# Patient Record
Sex: Male | Born: 1935 | Race: White | Hispanic: No | Marital: Married | Smoking: Never smoker
Health system: Southern US, Community
[De-identification: ages and names within clinical notes are randomized; demographics above are authoritative.]

---

## 2020-03-24 ENCOUNTER — Emergency Department: Payer: Medicare PPO

## 2020-03-24 ENCOUNTER — Inpatient Hospital Stay
Admission: EM | Admit: 2020-03-24 | Discharge: 2020-04-04 | DRG: 177 | Disposition: A | Discharge status: Home Health | Payer: Medicare PPO | Attending: Internal Medicine | Admitting: Internal Medicine

## 2020-03-24 ENCOUNTER — Other Ambulatory Visit: Payer: Self-pay

## 2020-03-24 DIAGNOSIS — G9341 Metabolic encephalopathy: Secondary | ICD-10-CM | POA: Diagnosis present

## 2020-03-24 DIAGNOSIS — R030 Elevated blood-pressure reading, without diagnosis of hypertension: Secondary | ICD-10-CM | POA: Diagnosis present

## 2020-03-24 DIAGNOSIS — I4891 Unspecified atrial fibrillation: Secondary | ICD-10-CM | POA: Diagnosis present

## 2020-03-24 DIAGNOSIS — F05 Delirium due to known physiological condition: Secondary | ICD-10-CM | POA: Diagnosis not present

## 2020-03-24 DIAGNOSIS — R4182 Altered mental status, unspecified: Secondary | ICD-10-CM

## 2020-03-24 DIAGNOSIS — U071 COVID-19: Secondary | ICD-10-CM | POA: Diagnosis present

## 2020-03-24 DIAGNOSIS — I248 Other forms of acute ischemic heart disease: Secondary | ICD-10-CM | POA: Diagnosis present

## 2020-03-24 DIAGNOSIS — J1282 Pneumonia due to coronavirus disease 2019: Secondary | ICD-10-CM

## 2020-03-24 DIAGNOSIS — Z23 Encounter for immunization: Secondary | ICD-10-CM | POA: Diagnosis present

## 2020-03-24 DIAGNOSIS — Z6823 Body mass index (BMI) 23.0-23.9, adult: Secondary | ICD-10-CM | POA: Diagnosis not present

## 2020-03-24 DIAGNOSIS — K5900 Constipation, unspecified: Secondary | ICD-10-CM

## 2020-03-24 DIAGNOSIS — F09 Unspecified mental disorder due to known physiological condition: Secondary | ICD-10-CM

## 2020-03-24 DIAGNOSIS — I2699 Other pulmonary embolism without acute cor pulmonale: Secondary | ICD-10-CM | POA: Diagnosis present

## 2020-03-24 DIAGNOSIS — N179 Acute kidney failure, unspecified: Secondary | ICD-10-CM

## 2020-03-24 DIAGNOSIS — E639 Nutritional deficiency, unspecified: Secondary | ICD-10-CM

## 2020-03-24 DIAGNOSIS — E43 Unspecified severe protein-calorie malnutrition: Secondary | ICD-10-CM | POA: Diagnosis present

## 2020-03-24 DIAGNOSIS — Z515 Encounter for palliative care: Secondary | ICD-10-CM | POA: Diagnosis not present

## 2020-03-24 DIAGNOSIS — R634 Abnormal weight loss: Secondary | ICD-10-CM

## 2020-03-24 DIAGNOSIS — Z7189 Other specified counseling: Secondary | ICD-10-CM | POA: Diagnosis not present

## 2020-03-24 DIAGNOSIS — R778 Other specified abnormalities of plasma proteins: Secondary | ICD-10-CM

## 2020-03-24 DIAGNOSIS — E860 Dehydration: Secondary | ICD-10-CM | POA: Diagnosis present

## 2020-03-24 LAB — COMPREHENSIVE METABOLIC PANEL
ALT: 27 U/L (ref 0–44)
AST: 46 U/L — ABNORMAL HIGH (ref 15–41)
Albumin: 3.6 g/dL (ref 3.5–5.0)
Alkaline Phosphatase: 69 U/L (ref 38–126)
Anion gap: 19 — ABNORMAL HIGH (ref 5–15)
BUN: 62 mg/dL — ABNORMAL HIGH (ref 8–23)
CO2: 23 mmol/L (ref 22–32)
Calcium: 9.4 mg/dL (ref 8.9–10.3)
Chloride: 99 mmol/L (ref 98–111)
Creatinine, Ser: 1.51 mg/dL — ABNORMAL HIGH (ref 0.61–1.24)
GFR, Estimated: 44 mL/min — ABNORMAL LOW (ref >60)
Glucose, Bld: 136 mg/dL — ABNORMAL HIGH (ref 70–99)
Potassium: 4.3 mmol/L (ref 3.5–5.1)
Sodium: 141 mmol/L (ref 135–145)
Total Bilirubin: 2 mg/dL — ABNORMAL HIGH (ref 0.3–1.2)
Total Protein: 7.8 g/dL (ref 6.5–8.1)

## 2020-03-24 LAB — CBC
HCT: 51.3 % (ref 39.0–52.0)
Hemoglobin: 17.1 g/dL — ABNORMAL HIGH (ref 13.0–17.0)
MCH: 29.7 pg (ref 26.0–34.0)
MCHC: 33.3 g/dL (ref 30.0–36.0)
MCV: 89.2 fL (ref 80.0–100.0)
Platelets: 261 10*3/uL (ref 150–400)
RBC: 5.75 MIL/uL (ref 4.22–5.81)
RDW: 13.4 % (ref 11.5–15.5)
WBC: 12.4 10*3/uL — ABNORMAL HIGH (ref 4.0–10.5)
nRBC: 0 % (ref 0.0–0.2)

## 2020-03-24 LAB — ACETAMINOPHEN LEVEL: Acetaminophen (Tylenol), Serum: <10 ug/mL — ABNORMAL LOW (ref 10–30)

## 2020-03-24 LAB — SALICYLATE LEVEL: Salicylate Lvl: <7 mg/dL — ABNORMAL LOW (ref 7.0–30.0)

## 2020-03-24 LAB — ETHANOL: Alcohol, Ethyl (B): <10 mg/dL (ref <10)

## 2020-03-24 LAB — RESP PANEL BY RT-PCR (FLU A&B, COVID) ARPGX2
Influenza A by PCR: NEGATIVE
Influenza B by PCR: NEGATIVE
SARS Coronavirus 2 by RT PCR: POSITIVE — AB

## 2020-03-24 LAB — TROPONIN I (HIGH SENSITIVITY)
Troponin I (High Sensitivity): 40 ng/L — ABNORMAL HIGH (ref <18)
Troponin I (High Sensitivity): 60 ng/L — ABNORMAL HIGH (ref <18)

## 2020-03-24 LAB — TSH: TSH: 1.62 u[IU]/mL (ref 0.350–4.500)

## 2020-03-24 MED ORDER — HYDROCOD POLST-CPM POLST ER 10-8 MG/5ML PO SUER
5.0000 mL | Freq: Two times a day (BID) | ORAL | Status: DC | PRN
  Administered 2020-03-26 – 2020-04-02 (×5): 5 mL via ORAL
  Filled 2020-03-24 (×7): qty 5

## 2020-03-24 MED ORDER — ONDANSETRON HCL 4 MG/2ML IJ SOLN: 4.0000 mg | Freq: Four times a day (QID) | INTRAMUSCULAR | Status: DC | PRN

## 2020-03-24 MED ORDER — ZINC SULFATE 220 (50 ZN) MG PO CAPS
220.0000 mg | ORAL_CAPSULE | Freq: Every day | ORAL | Status: DC
  Administered 2020-03-25 – 2020-04-04 (×11): 220 mg via ORAL
  Filled 2020-03-24 (×11): qty 1

## 2020-03-24 MED ORDER — SODIUM CHLORIDE 0.9 % IV SOLN: INTRAVENOUS | Status: AC

## 2020-03-24 MED ORDER — ACETAMINOPHEN 325 MG PO TABS: 650.0000 mg | ORAL_TABLET | Freq: Four times a day (QID) | ORAL | Status: DC | PRN

## 2020-03-24 MED ORDER — ALBUTEROL SULFATE HFA 108 (90 BASE) MCG/ACT IN AERS
2.0000 | INHALATION_SPRAY | Freq: Four times a day (QID) | RESPIRATORY_TRACT | Status: DC
  Administered 2020-03-25 – 2020-04-03 (×29): 2 via RESPIRATORY_TRACT
  Filled 2020-03-24: qty 6.7

## 2020-03-24 MED ORDER — SODIUM CHLORIDE 0.9 % IV SOLN
100.0000 mg | Freq: Every day | INTRAVENOUS | Status: AC
  Administered 2020-03-25 – 2020-03-28 (×4): 100 mg via INTRAVENOUS
  Filled 2020-03-24 (×5): qty 20

## 2020-03-24 MED ORDER — ASCORBIC ACID 500 MG PO TABS
500.0000 mg | ORAL_TABLET | Freq: Every day | ORAL | Status: DC
  Administered 2020-03-25 – 2020-04-04 (×11): 500 mg via ORAL
  Filled 2020-03-24 (×11): qty 1

## 2020-03-24 MED ORDER — ZOLPIDEM TARTRATE 5 MG PO TABS: 5.0000 mg | ORAL_TABLET | Freq: Every evening | ORAL | Status: DC | PRN

## 2020-03-24 MED ORDER — METOPROLOL TARTRATE 25 MG PO TABS
25.0000 mg | ORAL_TABLET | Freq: Two times a day (BID) | ORAL | Status: DC
  Administered 2020-03-24 – 2020-03-30 (×10): 25 mg via ORAL
  Filled 2020-03-24 (×12): qty 1

## 2020-03-24 MED ORDER — ONDANSETRON HCL 4 MG PO TABS: 4.0000 mg | ORAL_TABLET | Freq: Four times a day (QID) | ORAL | Status: DC | PRN

## 2020-03-24 MED ORDER — SODIUM CHLORIDE 0.9 % IV BOLUS
1000.0000 mL | Freq: Once | INTRAVENOUS | Status: AC
  Administered 2020-03-24: 1000 mL via INTRAVENOUS

## 2020-03-24 MED ORDER — ENOXAPARIN SODIUM 40 MG/0.4ML ~~LOC~~ SOLN
40.0000 mg | Freq: Every day | SUBCUTANEOUS | Status: DC
  Administered 2020-03-24: 40 mg via SUBCUTANEOUS
  Filled 2020-03-24: qty 0.4

## 2020-03-24 MED ORDER — GUAIFENESIN-DM 100-10 MG/5ML PO SYRP: 10.0000 mL | ORAL_SOLUTION | ORAL | Status: DC | PRN

## 2020-03-24 MED ORDER — HYDROCODONE-ACETAMINOPHEN 5-325 MG PO TABS: 1.0000 | ORAL_TABLET | ORAL | Status: DC | PRN

## 2020-03-24 MED ORDER — SODIUM CHLORIDE 0.9 % IV SOLN
200.0000 mg | Freq: Once | INTRAVENOUS | Status: AC
  Administered 2020-03-25: 200 mg via INTRAVENOUS
  Filled 2020-03-24: qty 200

## 2020-03-24 NOTE — ED Triage Notes (Signed)
Pt comes via [employer] with IVC paperwork. Per paperwork it states that the pt isn't eating or drinking. Family feels he may have dementia. Family really wants him medically cleared and checked out.  Pt denies any SI,HI, drug or alcohol abuse. Pt is calm and cooperative. Pt denies any hallucinations.  Pt states he doesn't eat because he doesn't have any money.

## 2020-03-24 NOTE — ED Provider Notes (Signed)
[facility] Emergency Department Provider Note ____________________________________________   First MD Initiated Contact with Patient 03/24/20 1806     (approximate)  I have reviewed the triage vital signs and the nursing notes.   HISTORY  Chief Complaint IVC  Level 5 caveat: History present illness limited due to altered mental status and possible dementia  HPI Darryl Haley is a 86 y.o. male with no known medical problems who presents with increased confusion and concern for possible dementia.  He was placed under involuntary commitment by family.  Per the IVC paperwork, "My father will not eat.  He won't take a bath.  He has lost 50 pounds and has started talking out of his head."  He is hearing noises that are not there and "acting like he has dementia."  Per the paperwork this has been worsening over the last week.  He has not seen a doctor in the last 8 years.  The patient himself denies any complaints.  He states he is here in the hospital because "You, the hospital, said I was hurt.  But I'm not hurt."  History reviewed. No pertinent past medical history.  Patient Active Problem List   Diagnosis Date Noted  . Pneumonia due to COVID-19 virus 03/24/2020  . AKI (acute kidney injury) (HCC) 03/24/2020  . Elevated troponin 03/24/2020  . Rapid atrial fibrillation, new onset (HCC) 03/24/2020  . Inadequate oral nutritional intake 03/24/2020  . Acute metabolic encephalopathy 03/24/2020  . Cognitive dysfunction 03/24/2020  . Unintentional weight loss 03/24/2020    History reviewed. No pertinent surgical history.  Prior to Admission medications   Not on File    Allergies Patient has no allergy information on record.  No family history on file.  Social History Social History   Tobacco Use  . Smoking status: Never Smoker  . Smokeless tobacco: Never Used  Substance Use Topics  . Alcohol use: Never  . Drug use: Never    Review of Systems Level  5 caveat: Review of systems limited due to altered mental status and possible dementia  Cardiovascular: Denies chest pain. Respiratory: Denies shortness of breath. Gastrointestinal: No vomiting. Genitourinary: Negative for dysuria.  Musculoskeletal: Negative for back pain. Neurological: Negative for headache.   ____________________________________________   PHYSICAL EXAM:  VITAL SIGNS: ED Triage Vitals  Enc Vitals Group     BP 03/24/20 1649 (!) 167/98     Pulse Rate 03/24/20 1649 85     Resp 03/24/20 1649 18     Temp 03/24/20 1649 98 F (36.7 C)     Temp src --      SpO2 03/24/20 1649 95 %     Weight 03/24/20 1646 175 lb (79.4 kg)     Height 03/24/20 1646 5\' 6"  (1.676 m)     Head Circumference --      Peak Flow --      Pain Score 03/24/20 1646 0     Pain Loc --      Pain Edu? --      Excl. in GC? --     Constitutional: Alert, oriented x1. Well appearing for age, in no acute distress. Eyes: Conjunctivae are normal.  EOMI.  PERRLA. Head: Atraumatic. Nose: No congestion/rhinnorhea. Mouth/Throat: Mucous membranes are somewhat dry.   Neck: Normal range of motion.  Cardiovascular: Normal rate, regular rhythm. Grossly normal heart sounds.  Good peripheral circulation. Respiratory: Normal respiratory effort.  No retractions. Lungs CTAB. Gastrointestinal: Soft and nontender. No distention.  Genitourinary: No flank  tenderness. Musculoskeletal: No lower extremity edema.  Extremities warm and well perfused.  Neurologic:  Normal speech and language.  Motor intact in all extremities.  No gross focal neurologic deficits are appreciated.  Skin:  Skin is warm and dry. No rash noted. Psychiatric: Calm and cooperative.  ____________________________________________   LABS (all labs ordered are listed, but only abnormal results are displayed)  Labs Reviewed  RESP PANEL BY RT-PCR (FLU A&B, COVID) ARPGX2 - Abnormal; Notable for the following components:      Result Value   SARS  Coronavirus 2 by RT PCR POSITIVE (*)    All other components within normal limits  COMPREHENSIVE METABOLIC PANEL - Abnormal; Notable for the following components:   Glucose, Bld 136 (*)    BUN 62 (*)    Creatinine, Ser 1.51 (*)    AST 46 (*)    Total Bilirubin 2.0 (*)    GFR, Estimated 44 (*)    Anion gap 19 (*)    All other components within normal limits  SALICYLATE LEVEL - Abnormal; Notable for the following components:   Salicylate Lvl <7.0 (*)    All other components within normal limits  ACETAMINOPHEN LEVEL - Abnormal; Notable for the following components:   Acetaminophen (Tylenol), Serum <10 (*)    All other components within normal limits  CBC - Abnormal; Notable for the following components:   WBC 12.4 (*)    Hemoglobin 17.1 (*)    All other components within normal limits  TROPONIN I (HIGH SENSITIVITY) - Abnormal; Notable for the following components:   Troponin I (High Sensitivity) 40 (*)    All other components within normal limits  ETHANOL  TSH  URINE DRUG SCREEN, QUALITATIVE ([facility] ONLY)  URINALYSIS, COMPLETE (UACMP) WITH MICROSCOPIC  TROPONIN I (HIGH SENSITIVITY)   ____________________________________________  EKG  ED ECG REPORT I, Dionne Bucy, the attending physician, personally viewed and interpreted this ECG.  Date: 03/24/2020 EKG Time: 1916 Rate: 121 Rhythm: Atrial fibrillation QRS Axis: normal Intervals: normal ST/T Wave abnormalities: Nonspecific ST abnormalities Narrative Interpretation: Atrial fibrillation with no evidence of acute ischemia  ____________________________________________  RADIOLOGY  CXR interpreted by me bilateral patchy opacities. CT head: No ICH or other acute abnormality  ____________________________________________   PROCEDURES  Procedure(s) performed: No  Procedures  Critical Care performed: No ____________________________________________   INITIAL IMPRESSION / ASSESSMENT AND PLAN / ED  COURSE  Pertinent labs & imaging results that were available during my care of the patient were reviewed by me and considered in my medical decision making (see chart for details).  86 year old male with no known past medical history presents with increased confusion, decreased p.o. intake, and concern for possible dementia.  Per IVC paperwork he has declined over the last week.  The patient himself denies any complaints.  On exam he is overall well-appearing for his age.  He is alert and attempts to answer questions, however he is confused and is only oriented x1.  His vital signs are normal except for hypertension.  Neurologic exam is nonfocal.  The exam is otherwise as described above.  Differential is very broad but includes new onset dementia, other CNS etiology, UTI or other infection, metabolic cause, or cardiac etiology.  We will obtain a lab work-up, CT head, chest x-ray, UA, and reassess.  If there are no acute medical issues identified, the patient likely will need psychiatry and/or social work evaluation.  ----------------------------------------- 9:46 PM on 03/24/2020 -----------------------------------------  Work-up is notable for multiple findings.  The patient  is Covid positive, with bilateral opacities on the chest x-ray.  He has a slightly elevated troponin, elevated anion gap, leukocytosis, and his EKG shows new onset atrial fibrillation.  Because of these multiple findings, the patient will need admission for further work-up.  I discussed the findings with his son over the phone and daughter-in-law who is here in person.  I will leave the IVC in place at this time although I do not think there is an indication for urgent psychiatric evaluation.  If the patient's mental status clears, this may be rescinded.  I discussed the case with Dr. Para March from the hospitalist service for admission. ____________________________________________   FINAL CLINICAL IMPRESSION(S) / ED  DIAGNOSES  Final diagnoses:  COVID  Altered mental status, unspecified altered mental status type  Atrial fibrillation, unspecified type (HCC)      NEW MEDICATIONS STARTED DURING THIS VISIT:  New Prescriptions   No medications on file     Note:  This document was prepared using Dragon voice recognition software and may include unintentional dictation errors.   Dionne Bucy, MD 03/24/20 2147

## 2020-03-24 NOTE — ED Notes (Signed)
Pt given water. Pt guzzled down all the water. Pt states he was thirsty.

## 2020-03-24 NOTE — Consult Note (Signed)
Remdesivir - Pharmacy Brief Note   O:  ALT: 27 CXR: "Patchy mid to lower lung airspace opacities bilaterally, suspicious for multifocal pneumonia" SpO2: 88-97% on room air   A/P:  03/24/20 SARS-CoV-2 PCR (+)  Remdesivir 200 mg IVPB once followed by 100 mg IVPB daily x 4 days.   Darryl Haley 03/24/2020 10:11 PM

## 2020-03-24 NOTE — ED Notes (Signed)
Lab contacted to add on troponin and TSH

## 2020-03-24 NOTE — ED Notes (Signed)
See triage note, pt IVC to check for medical concerns.  Pt disoriented to time and situation.  Oriented to person and place.  States unsure of why he is in the hospital, denies pain.  NAD noted Unsteady gait. Stretcher locked in lowest position.

## 2020-03-24 NOTE — ED Notes (Signed)
Pt IVC 

## 2020-03-24 NOTE — ED Notes (Signed)
Pt dressed out into hospital attire with [employer]. Pt's belongings to include: 1 brown shirt 1 blue jeans 2 brown shoes 1 black belt 1 blue boxer shorts 2 black socks

## 2020-03-24 NOTE — H&P (Signed)
History and Physical    Darryl Haley CWC:[id number] DOB: 22-Mar-1930 DOA: 03/24/2020  PCP: Pcp, No   Patient coming from: Home  I have personally briefly reviewed patient's old medical records in [facility]  Chief Complaint: Confusion, not eating, lethargy  HPI: Darryl Haley is a 86 y.o. male with no significant past medical history and who has not seen a doctor in 8 years and who lives independently but whose son checks in on him twice a day, who was brought in under involuntary commitment due to concerns for confusion, lethargy and not eating. Most of the history is taken from his son who states that patient was in his usual state of health until the past 2 weeks when he appeared more confused than his baseline. At baseline he is independent in his ADLs but has been noted to have some memory problems over the past 5 months. Over the past 2 weeks patient has been telling his son that he is hearing things and that someone is breaking into his house and stealing things. He is also had decreased appetite and not been eating over the past 2 weeks and just sitting around the house all day, not walking to the mailbox according to his daily routine. Over the past 5 days he developed a cough but has had no shortness of breath, fever or chills or chest pain. No nausea, vomiting or change in bowel habits or dysuria. Son does note that over the past several months he has lost about 50 pounds. Patient has always refused to going to the doctor to be evaluated. Due to increasing concerns over the past 2 weeks son decided to IVC him after they called EMS and he refused to be transported to the hospital by ambulance. ED Course: On arrival, patient awake alert but confused. Afebrile, BP 167/98, heart rate 95, O2 sat 95% on room air. Blood work significant for creatinine of 1.51 with slightly elevated anion gap of 19. AST 46, total bilirubin 2.0. WBC 12.4, hemoglobin 17.1. Troponin 40. TSH WNL, salicylate and  acetaminophen and alcohol levels undetectable. Covid PCR positive. EKG as reviewed by me : A. fib, rate 121 with no acute ST-T wave changes Chest x-ray: Patchy mid to lower lung airspace opacities concerning for multifocal pneumonia Head CT: No acute intracranial abnormalities Patient was given an IV fluid bolus in the ER. Hospitalist consulted for admission.   Review of Systems: Unreliable due to confusion   History reviewed. No pertinent past medical history.  History reviewed. No pertinent surgical history.   reports that he has never smoked. He has never used smokeless tobacco. He reports that he does not drink alcohol and does not use drugs.  Not on File  History reviewed. No pertinent family history.    Prior to Admission medications   Not on File    Physical Exam: Vitals:   03/24/20 1646 03/24/20 1649 03/24/20 1930 03/24/20 2030  BP:  (!) 167/98 (!) 159/86 (!) 160/93  Pulse:  85 97 99  Resp:  18 16 17   Temp:  98 F (36.7 C)    SpO2:  95% 95% 97%  Weight: 79.4 kg     Height: 5\' 6"  (1.676 m)        Vitals:   03/24/20 1646 03/24/20 1649 03/24/20 1930 03/24/20 2030  BP:  (!) 167/98 (!) 159/86 (!) 160/93  Pulse:  85 97 99  Resp:  18 16 17   Temp:  98 F (36.7 C)    SpO2:  95% 95% 97%  Weight: 79.4 kg     Height: 5\' 6"  (1.676 m)         Constitutional: Alert and oriented x 1 . Patient disoriented, unable to answer appropriately to questions. Not in any apparent distress HEENT:      Head: Normocephalic and atraumatic.         Eyes: PERLA, EOMI, Conjunctivae are normal. Sclera is non-icteric.       Mouth/Throat: Mucous membranes are moist.       Neck: Supple with no signs of meningismus. Cardiovascular: Regular rate and rhythm. No murmurs, gallops, or rubs. 2+ symmetrical distal pulses are present . No JVD. No LE edema Respiratory: Respiratory effort normal .Lungs sounds diminished bilaterally. No wheezes, crackles, or rhonchi.  Gastrointestinal: Soft, non  tender, and non distended with positive bowel sounds. No rebound or guarding. Genitourinary: No CVA tenderness. Musculoskeletal: Nontender with normal range of motion in all extremities. No cyanosis, or erythema of extremities. Neurologic:  Face is symmetric. Moving all extremities. No gross focal neurologic deficits . Skin: Skin is warm, dry.  No rash or ulcers Psychiatric: Mood and affect are normal    Labs on Admission: I have personally reviewed following labs and imaging studies  CBC: Recent Labs  Lab 03/24/20 1656  WBC 12.4*  HGB 17.1*  HCT 51.3  MCV 89.2  PLT 261   Basic Metabolic Panel: Recent Labs  Lab 03/24/20 1656  NA 141  K 4.3  CL 99  CO2 23  GLUCOSE 136*  BUN 62*  CREATININE 1.51*  CALCIUM 9.4   GFR: Estimated Creatinine Clearance: 33.5 mL/min (A) (by C-G formula based on SCr of 1.51 mg/dL (H)). Liver Function Tests: Recent Labs  Lab 03/24/20 1656  AST 46*  ALT 27  ALKPHOS 69  BILITOT 2.0*  PROT 7.8  ALBUMIN 3.6   No results for input(s): LIPASE, AMYLASE in the last 168 hours. No results for input(s): AMMONIA in the last 168 hours. Coagulation Profile: No results for input(s): INR, PROTIME in the last 168 hours. Cardiac Enzymes: No results for input(s): CKTOTAL, CKMB, CKMBINDEX, TROPONINI in the last 168 hours. BNP (last 3 results) No results for input(s): PROBNP in the last 8760 hours. HbA1C: No results for input(s): HGBA1C in the last 72 hours. CBG: No results for input(s): GLUCAP in the last 168 hours. Lipid Profile: No results for input(s): CHOL, HDL, LDLCALC, TRIG, CHOLHDL, LDLDIRECT in the last 72 hours. Thyroid Function Tests: Recent Labs    03/24/20 1847  TSH 1.620   Anemia Panel: No results for input(s): VITAMINB12, FOLATE, FERRITIN, TIBC, IRON, RETICCTPCT in the last 72 hours. Urine analysis: No results found for: COLORURINE, APPEARANCEUR, LABSPEC, PHURINE, GLUCOSEU, HGBUR, BILIRUBINUR, KETONESUR, PROTEINUR, UROBILINOGEN,  NITRITE, LEUKOCYTESUR  Radiological Exams on Admission: CT Head Wo Contrast  Result Date: 03/24/2020 CLINICAL DATA:  86 year old male with altered mental status. EXAM: CT HEAD WITHOUT CONTRAST TECHNIQUE: Contiguous axial images were obtained from the base of the skull through the vertex without intravenous contrast. COMPARISON:  None. FINDINGS: Brain: Mild age-related atrophy and chronic microvascular ischemic changes. There is no acute intracranial hemorrhage. No mass effect or midline shift. No extra-axial fluid collection. Vascular: No hyperdense vessel or unexpected calcification. Skull: Normal. Negative for fracture or focal lesion. Sinuses/Orbits: No acute finding. Other: None IMPRESSION: 1. No acute intracranial pathology. 2. Mild age-related atrophy and chronic microvascular ischemic changes. Electronically Signed   By: 98 M.D.   On: 03/24/2020 19:27   DG Chest Portable  1 View  Result Date: 03/24/2020 CLINICAL DATA:  Weakness EXAM: PORTABLE CHEST 1 VIEW COMPARISON:  None. FINDINGS: Patchy mid to lower lung airspace opacities. No pleural effusion. Normal heart size. No pneumothorax. IMPRESSION: Patchy mid to lower lung airspace opacities bilaterally, suspicious for multifocal pneumonia. Electronically Signed   By: Jasmine Pang M.D.   On: 03/24/2020 19:36     Assessment/Plan 86 year old male with no significant past medical history presenting with 2 weeks of confusion, lethargy and not eating and a cough.     Acute metabolic encephalopathy   Suspect underlying cognitive dysfunction/dementia -Patient presents with 2-week history of confusion and paranoid ideation, lethargy, with 35-month history of cognitive dysfunction. Independent in ADLs at baseline. -Likely multifactorial related to Covid pneumonia, dehydration -Treat acute etiologies, outlined below -Fall and aspiration precautions -Patient currently IVC by son as he refused to come into the hospital for evaluation  which will continue for now    Pneumonia due to COVID-19 virus -Patient with 5-day history of cough but no shortness of breath or fever and not hypoxic -Covid PCR positive with chest x-ray showing multifocal airspace opacities -Remdesivir, albuterol, antitussives and vitamins -Not currently hypoxic. Monitor pulse ox. Oxygen to keep sats over 94%    AKI (acute kidney injury) (HCC) -Creatinine of 1.55 with BUN 62, suspect prerenal related to decreased oral intake over the past 2 weeks related to his acute medical condition -IV hydration up to 1500 mils then as needed given Covid pneumonia -Encourage oral intake    Rapid atrial fibrillation, new onset (HCC) -EKG with rapid A. fib rate 121 -CHA2DS2-VASc score of 3 based on age of 86 and suspect underlying undiagnosed hypertension -Metoprolol 25 mg twice daily -Will benefit from systemic anticoagulation for stroke prevention. Deferring oral anticoagulation pending cardiology consult -Cardiology consult for recommendations -Echocardiogram    Elevated troponin -Troponin 40. Suspect supply demand mismatch related to rapid A. fib and Covid pneumonia -Continue to monitor    Inadequate oral nutritional intake   Unintentional weight loss, suspect protein calorie malnutrition unspecified -Son reports weight loss of 50 pounds over the past 5 months with decreased oral intake over the past 2 weeks -Nutritionist evaluation    Elevated blood-pressure reading without prior diagnosis of hypertension Suspect essential hypertension -BP 167/98 on arrival and remaining elevated -Suspect undiagnosed hypertension. Patient has not seen a physician in 8 years -Placed on metoprolol twice daily for A. fib. -Continue to monitor    DVT prophylaxis: Lovenox  Code Status: full code as discussed with son Family Communication: Son, [place] Disposition Plan: Back to previous home environment Consults called: Cardiology Status:At the time of admission,  it appears that the appropriate admission status for this patient is INPATIENT. This is judged to be reasonable and necessary in order to provide the required intensity of service to ensure the patient's safety given the presenting symptoms, physical exam findings, and initial radiographic and laboratory data in the context of their  Comorbid conditions.   Patient requires inpatient status due to high intensity of service, high risk for further deterioration and high frequency of surveillance required.   I certify that at the point of admission it is my clinical judgment that the patient will require inpatient hospital care spanning beyond 2 midnights     Andris Baumann MD [facility]     03/24/2020, 10:08 PM

## 2020-03-25 ENCOUNTER — Encounter: Payer: Self-pay | Admitting: Internal Medicine

## 2020-03-25 ENCOUNTER — Inpatient Hospital Stay: Payer: Medicare PPO

## 2020-03-25 ENCOUNTER — Inpatient Hospital Stay
Admit: 2020-03-25 | Discharge: 2020-03-25 | Disposition: A | Discharge status: Home / Self Care | Payer: Medicare PPO | Attending: Internal Medicine | Admitting: Internal Medicine

## 2020-03-25 DIAGNOSIS — G9341 Metabolic encephalopathy: Secondary | ICD-10-CM | POA: Diagnosis not present

## 2020-03-25 LAB — URINALYSIS, COMPLETE (UACMP) WITH MICROSCOPIC
Bilirubin Urine: NEGATIVE
Glucose, UA: NEGATIVE mg/dL
Ketones, ur: 5 mg/dL — AB
Leukocytes,Ua: NEGATIVE
Nitrite: NEGATIVE
Protein, ur: NEGATIVE mg/dL
Specific Gravity, Urine: 1.044 — ABNORMAL HIGH (ref 1.005–1.030)
Squamous Epithelial / HPF: NONE SEEN (ref 0–5)
pH: 5 (ref 5.0–8.0)

## 2020-03-25 LAB — CBC WITH DIFFERENTIAL/PLATELET
Abs Immature Granulocytes: 0.05 10*3/uL (ref 0.00–0.07)
Basophils Absolute: 0 10*3/uL (ref 0.0–0.1)
Basophils Relative: 0 %
Eosinophils Absolute: 0.1 10*3/uL (ref 0.0–0.5)
Eosinophils Relative: 1 %
HCT: 43.4 % (ref 39.0–52.0)
Hemoglobin: 14.4 g/dL (ref 13.0–17.0)
Immature Granulocytes: 1 %
Lymphocytes Relative: 8 %
Lymphs Abs: 0.6 10*3/uL — ABNORMAL LOW (ref 0.7–4.0)
MCH: 29.6 pg (ref 26.0–34.0)
MCHC: 33.2 g/dL (ref 30.0–36.0)
MCV: 89.1 fL (ref 80.0–100.0)
Monocytes Absolute: 0.5 10*3/uL (ref 0.1–1.0)
Monocytes Relative: 6 %
Neutro Abs: 6.9 10*3/uL (ref 1.7–7.7)
Neutrophils Relative %: 84 %
Platelets: 184 10*3/uL (ref 150–400)
RBC: 4.87 MIL/uL (ref 4.22–5.81)
RDW: 13.4 % (ref 11.5–15.5)
WBC: 8.2 10*3/uL (ref 4.0–10.5)
nRBC: 0 % (ref 0.0–0.2)

## 2020-03-25 LAB — URINE DRUG SCREEN, QUALITATIVE (ARMC ONLY)
Amphetamines, Ur Screen: NOT DETECTED
Barbiturates, Ur Screen: NOT DETECTED
Benzodiazepine, Ur Scrn: NOT DETECTED
Cannabinoid 50 Ng, Ur ~~LOC~~: NOT DETECTED
Cocaine Metabolite,Ur ~~LOC~~: NOT DETECTED
MDMA (Ecstasy)Ur Screen: NOT DETECTED
Methadone Scn, Ur: NOT DETECTED
Opiate, Ur Screen: NOT DETECTED
Phencyclidine (PCP) Ur S: NOT DETECTED

## 2020-03-25 LAB — COMPREHENSIVE METABOLIC PANEL
ALT: 19 U/L (ref 0–44)
AST: 36 U/L (ref 15–41)
Albumin: 2.8 g/dL — ABNORMAL LOW (ref 3.5–5.0)
Alkaline Phosphatase: 54 U/L (ref 38–126)
Anion gap: 12 (ref 5–15)
BUN: 51 mg/dL — ABNORMAL HIGH (ref 8–23)
CO2: 26 mmol/L (ref 22–32)
Calcium: 8.3 mg/dL — ABNORMAL LOW (ref 8.9–10.3)
Chloride: 105 mmol/L (ref 98–111)
Creatinine, Ser: 1.08 mg/dL (ref 0.61–1.24)
GFR, Estimated: >60 mL/min (ref >60)
Glucose, Bld: 94 mg/dL (ref 70–99)
Potassium: 4.4 mmol/L (ref 3.5–5.1)
Sodium: 143 mmol/L (ref 135–145)
Total Bilirubin: 1.7 mg/dL — ABNORMAL HIGH (ref 0.3–1.2)
Total Protein: 5.9 g/dL — ABNORMAL LOW (ref 6.5–8.1)

## 2020-03-25 LAB — ECHOCARDIOGRAM COMPLETE
AR max vel: 2.09 cm2
AV Area VTI: 1.71 cm2
AV Area mean vel: 2.1 cm2
AV Mean grad: 4 mmHg
AV Peak grad: 6.7 mmHg
Ao pk vel: 1.29 m/s
Area-P 1/2: 5.23 cm2
Height: 66 in
S' Lateral: 3.01 cm
Weight: 2800 oz

## 2020-03-25 LAB — PROCALCITONIN: Procalcitonin: <0.1 ng/mL

## 2020-03-25 LAB — C-REACTIVE PROTEIN: CRP: 6.1 mg/dL — ABNORMAL HIGH (ref <1.0)

## 2020-03-25 LAB — FERRITIN: Ferritin: 196 ng/mL (ref 24–336)

## 2020-03-25 LAB — FIBRIN DERIVATIVES D-DIMER (ARMC ONLY): Fibrin derivatives D-dimer (ARMC): >7500 ng/mL (FEU) — ABNORMAL HIGH (ref 0.00–499.00)

## 2020-03-25 LAB — MAGNESIUM: Magnesium: 2.7 mg/dL — ABNORMAL HIGH (ref 1.7–2.4)

## 2020-03-25 MED ORDER — IOHEXOL 350 MG/ML SOLN
75.0000 mL | Freq: Once | INTRAVENOUS | Status: AC | PRN
  Administered 2020-03-25: 75 mL via INTRAVENOUS

## 2020-03-25 MED ORDER — DEXAMETHASONE 4 MG PO TABS
6.0000 mg | ORAL_TABLET | Freq: Every day | ORAL | Status: DC
  Administered 2020-03-25 – 2020-04-03 (×10): 6 mg via ORAL
  Filled 2020-03-25: qty 2
  Filled 2020-03-25: qty 1.5
  Filled 2020-03-25 (×8): qty 2

## 2020-03-25 MED ORDER — HALOPERIDOL LACTATE 5 MG/ML IJ SOLN
1.0000 mg | Freq: Four times a day (QID) | INTRAMUSCULAR | Status: AC | PRN
  Administered 2020-03-25 – 2020-03-26 (×2): 1 mg via INTRAVENOUS
  Filled 2020-03-25 (×2): qty 1

## 2020-03-25 MED ORDER — APIXABAN 5 MG PO TABS
10.0000 mg | ORAL_TABLET | Freq: Two times a day (BID) | ORAL | Status: AC
  Administered 2020-03-25 – 2020-04-01 (×14): 10 mg via ORAL
  Filled 2020-03-25 (×6): qty 2
  Filled 2020-03-25: qty 4
  Filled 2020-03-25 (×6): qty 2

## 2020-03-25 MED ORDER — APIXABAN 5 MG PO TABS
5.0000 mg | ORAL_TABLET | Freq: Two times a day (BID) | ORAL | Status: DC
  Administered 2020-04-01 – 2020-04-04 (×6): 5 mg via ORAL
  Filled 2020-03-25 (×6): qty 1

## 2020-03-25 NOTE — ED Notes (Signed)
Report given to Emily, RN.

## 2020-03-25 NOTE — Progress Notes (Signed)
PROGRESS NOTE    Darryl Haley  WYO:[id number] DOB: 25-Feb-1930 DOA: 03/24/2020 PCP: Pcp, No    Assessment & Plan:   Principal Problem:   Acute metabolic encephalopathy Active Problems:   Pneumonia due to COVID-19 virus   AKI (acute kidney injury) ([place])   Elevated troponin   Rapid atrial fibrillation, new onset (HCC)   Inadequate oral nutritional intake   Cognitive dysfunction   Unintentional weight loss   Elevated blood-pressure reading without prior diagnosis of hypertension    Darryl Haley is a 86 y.o. male with no significant past medical history and who has not seen a doctor in 8 years and who lives independently but whose son checks in on him twice a day, who was brought in under involuntary commitment due to concerns for confusion, lethargy and not eating. Most of the history is taken from his son who states that patient was in his usual state of health until the past 2 weeks when he appeared more confused than his baseline.  At baseline he is independent in his ADLs but has been noted to have some memory problems over the past 5 months.     AMS 2/2 COVID encephalopathy Suspect underlying cognitive dysfunction/dementia -Patient presents with 2-week history of confusion and paranoid ideation, lethargy, with 6-monthhistory of cognitive dysfunction. Independent in ADLs at baseline. -Likely multifactorial related to Covid, dehydration PLAN: -Treat acute etiologies, outlined below -Fall and aspiration precautions -Patient currently IVC by son as he refused to come into the hospital for evaluation which will continue for now    Pneumonia due to COVID-19 virus -Patient with 5-day history of cough but no shortness of breath or fever and not hypoxic -Covid PCR positive with chest x-ray showing multifocal airspace opacities PLAN: --cont Remdesivir --start oral decadron -albuterol, antitussives and vitamins  Acute PE, POA --D-dimer >7500 on presentation.  CTA chest positive for  PE. PLAN: --start Eliquis    AKI ([place] kidney injury) ([place] -Creatinine of 1.55 with BUN 62, suspect prerenal related to decreased oral intake over the past 2 weeks related to his acute medical condition.  Cr improved with IVF. PLAN: --Hold further IVF and encourage oral hydration.    Rapid atrial fibrillation, new onset ([facility] -EKG with rapid A. fib rate 121 -CHA2DS2-VASc score of 3 based on age of 89and suspect underlying undiagnosed hypertension --rate controlled with Metoprolol 25 mg twice daily PLAN: --cont metop 25 mg BID --started on Eliquis for PE    Elevated troponin 2/2 demand ischemia -Troponin 40 and flat.  Suspect supply demand mismatch related to rapid A. fib and Covid pneumonia    Inadequate oral nutritional intake   Unintentional weight loss, suspect protein calorie malnutrition unspecified -Son reports weight loss of 50 pounds over the past 5 months with decreased oral intake over the past 2 weeks -Nutritionist evaluation     Elevated blood-pressure reading without prior diagnosis of hypertension Suspect essential hypertension -BP 167/98 on arrival and remaining elevated -Suspect undiagnosed hypertension. Patient has not seen a physician in 8 years PLAN: --cont metop 25 mg BID   DVT prophylaxis: ODX:AJOINOMCode Status: Full code  Family Communication: son updated on the phone today Status is: inpatient Dispo:   The patient is from: home Anticipated d/c is to: undetermined Anticipated d/c date is: > 3 days Patient currently is not medically stable to d/c due to: COVID encephalopathy, on treatment   Subjective and Interval History:  Pt was alert, but still confused.  Didn't want to eat/drink  unless specifically offered by nursing.     Objective: Vitals:   03/25/20 1226 03/25/20 1500 03/25/20 1530 03/25/20 1600  BP: 123/75 130/72 130/78 126/61  Pulse:  67    Resp:  17 19   Temp:      TempSrc:      SpO2:  95%    Weight:      Height:         Intake/Output Summary (Last 24 hours) at 03/25/2020 1625 Last data filed at 03/25/2020 1326 Gross per 24 hour  Intake 1350 ml  Output --  Net 1350 ml   Filed Weights   03/24/20 1646  Weight: 79.4 kg    Examination:   Constitutional: NAD, alert, oriented to self HEENT: conjunctivae and lids normal, EOMI CV: No cyanosis.   RESP: normal respiratory effort, on RA Extremities: No effusions, edema in BLE SKIN: warm, dry and intact   Data Reviewed: I have personally reviewed following labs and imaging studies  CBC: Recent Labs  Lab 03/24/20 1656 03/25/20 0631  WBC 12.4* 8.2  NEUTROABS  --  6.9  HGB 17.1* 14.4  HCT 51.3 43.4  MCV 89.2 89.1  PLT 261 591   Basic Metabolic Panel: Recent Labs  Lab 03/24/20 1656 03/25/20 0631  NA 141 143  K 4.3 4.4  CL 99 105  CO2 23 26  GLUCOSE 136* 94  BUN 62* 51*  CREATININE 1.51* 1.08  CALCIUM 9.4 8.3*  MG  --  2.7*   GFR: Estimated Creatinine Clearance: 46.8 mL/min (by C-G formula based on SCr of 1.08 mg/dL). Liver Function Tests: Recent Labs  Lab 03/24/20 1656 03/25/20 0631  AST 46* 36  ALT 27 19  ALKPHOS 69 54  BILITOT 2.0* 1.7*  PROT 7.8 5.9*  ALBUMIN 3.6 2.8*   No results for input(s): LIPASE, AMYLASE in the last 168 hours. No results for input(s): AMMONIA in the last 168 hours. Coagulation Profile: No results for input(s): INR, PROTIME in the last 168 hours. Cardiac Enzymes: No results for input(s): CKTOTAL, CKMB, CKMBINDEX, TROPONINI in the last 168 hours. BNP (last 3 results) No results for input(s): PROBNP in the last 8760 hours. HbA1C: No results for input(s): HGBA1C in the last 72 hours. CBG: No results for input(s): GLUCAP in the last 168 hours. Lipid Profile: No results for input(s): CHOL, HDL, LDLCALC, TRIG, CHOLHDL, LDLDIRECT in the last 72 hours. Thyroid Function Tests: Recent Labs    03/24/20 1847  TSH 1.620   Anemia Panel: Recent Labs    03/25/20 0631  FERRITIN 196   Sepsis  Labs: Recent Labs  Lab 03/25/20 0631  PROCALCITON <0.10    Recent Results (from the past 240 hour(s))  Resp Panel by RT-PCR (Flu A&B, Covid) Nasopharyngeal Swab     Status: Abnormal   Collection Time: 03/24/20  7:17 PM   Specimen: Nasopharyngeal Swab; Nasopharyngeal(NP) swabs in vial transport medium  Result Value Ref Range Status   SARS Coronavirus 2 by RT PCR POSITIVE (A) NEGATIVE Final    Comment: RESULT CALLED TO, READ BACK BY AND VERIFIED WITH: MAC BROWN,RN 2101 03/24/2020 DB (NOTE) SARS-CoV-2 target nucleic acids are DETECTED.  The SARS-CoV-2 RNA is generally detectable in upper respiratory specimens during the acute phase of infection. Positive results are indicative of the presence of the identified virus, but do not rule out bacterial infection or co-infection with other pathogens not detected by the test. Clinical correlation with patient history and other diagnostic information is necessary to determine patient infection status.  The expected result is Negative.  Fact Sheet for Patients: EntrepreneurPulse.com.au  Fact Sheet for Healthcare Providers: IncredibleEmployment.be  This test is not yet approved or cleared by the [place] FDA and  has been authorized for detection and/or diagnosis of SARS-CoV-2 by FDA under an Emergency Use Authorization (EUA).  This EUA will remain in effect (meaning this test can be u sed) for the duration of  the COVID-19 declaration under Section 564(b)(1) of the Act, 21 U.S.C. section 360bbb-3(b)(1), unless the authorization is terminated or revoked sooner.     Influenza A by PCR NEGATIVE NEGATIVE Final   Influenza B by PCR NEGATIVE NEGATIVE Final    Comment: (NOTE) The Xpert Xpress SARS-CoV-2/FLU/RSV plus assay is intended as an aid in the diagnosis of influenza from Nasopharyngeal swab specimens and should not be used as a sole basis for treatment. Nasal washings and aspirates are  unacceptable for Xpert Xpress SARS-CoV-2/FLU/RSV testing.  Fact Sheet for Patients: EntrepreneurPulse.com.au  Fact Sheet for Healthcare Providers: IncredibleEmployment.be  This test is not yet approved or cleared by the [place] FDA and has been authorized for detection and/or diagnosis of SARS-CoV-2 by FDA under an Emergency Use Authorization (EUA). This EUA will remain in effect (meaning this test can be used) for the duration of the COVID-19 declaration under Section 564(b)(1) of the Act, 21 U.S.C. section 360bbb-3(b)(1), unless the authorization is terminated or revoked.  Performed at [facility], [address], [place], [place] [address]       Radiology Studies: CT Head Wo Contrast  Result Date: 03/24/2020 CLINICAL DATA:  86 year old male with altered mental status. EXAM: CT HEAD WITHOUT CONTRAST TECHNIQUE: Contiguous axial images were obtained from the base of the skull through the vertex without intravenous contrast. COMPARISON:  None. FINDINGS: Brain: Mild age-related atrophy and chronic microvascular ischemic changes. There is no acute intracranial hemorrhage. No mass effect or midline shift. No extra-axial fluid collection. Vascular: No hyperdense vessel or unexpected calcification. Skull: Normal. Negative for fracture or focal lesion. Sinuses/Orbits: No acute finding. Other: None IMPRESSION: 1. No acute intracranial pathology. 2. Mild age-related atrophy and chronic microvascular ischemic changes. Electronically Signed   By: Anner Crete M.D.   On: 03/24/2020 19:27   CT ANGIO CHEST PE W OR WO CONTRAST  Result Date: 03/25/2020 CLINICAL DATA:  COVID positive patient. Shortness of breath, weakness and confusion. EXAM: CT ANGIOGRAPHY CHEST WITH CONTRAST TECHNIQUE: Multidetector CT imaging of the chest was performed using the standard protocol during bolus administration of intravenous contrast. Multiplanar CT image  reconstructions and MIPs were obtained to evaluate the vascular anatomy. CONTRAST:  16m OMNIPAQUE IOHEXOL 350 MG/ML SOLN COMPARISON:  None. FINDINGS: Cardiovascular: Mild cardiac enlargement. 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 Aortic atherosclerosis. Coronary artery atherosclerotic calcification. The main pulmonary artery appears patent. No obstructing pulmonary emboli. Filling defect within the segmental branch to the lingula is identified, image 161/5. A second filling defect is identified within a segmental branch to the lateral right lung base, image 196/5. Mediastinum/Nodes: No thyroid nodules. Trachea appears patent and midline. Unremarkable appearance of the esophagus. There is no axillary, supraclavicular, mediastinal adenopathy. Lungs/Pleura: No pleural effusion. Bilateral, peripheral and basal predominant ground-glass opacities and consolidation with extensive geographic distribution. Imaging findings consistent with COVID pneumonia. Upper Abdomen: No acute findings Musculoskeletal: Thoracic spondylosis. No acute or suspicious osseous findings. Review of the MIP images confirms the above findings. IMPRESSION: 1. Examination is positive for acute pulmonary emboli within the segmental  branches to the lingula and right lateral lung base. 2. Bilateral, peripheral and basal predominant ground-glass opacities and consolidation with extensive geographic distribution. Imaging findings consistent with COVID pneumonia. 3. Aortic atherosclerosis. Coronary artery calcifications noted. Aortic Atherosclerosis (ICD10-I70.0). Critical Value/emergent results were called by telephone at the time of interpretation on 03/25/2020 at 10:44 am to provider Lavonia Drafts, MD, who verbally acknowledged these results. Electronically Signed   By: Kerby Moors M.D.   On: 03/25/2020 10:44   DG Chest Portable 1 View  Result Date: 03/24/2020 CLINICAL DATA:  Weakness EXAM:  PORTABLE CHEST 1 VIEW COMPARISON:  None. FINDINGS: Patchy mid to lower lung airspace opacities. No pleural effusion. Normal heart size. No pneumothorax. IMPRESSION: Patchy mid to lower lung airspace opacities bilaterally, suspicious for multifocal pneumonia. Electronically Signed   By: Donavan Foil M.D.   On: 03/24/2020 19:36   ECHOCARDIOGRAM COMPLETE  Result Date: 03/25/2020    ECHOCARDIOGRAM REPORT   Patient Name:   Darryl Haley Date of Exam: 03/25/2020 Medical Rec #:  [id number]   Height:       66.0 in Accession #:    [id number]  Weight:       175.0 lb Date of Birth:  1930-02-17   BSA:          1.889 m Patient Age:    46 years    BP:           156/64 mmHg Patient Gender: M           HR:           81 bpm. Exam Location:  [facility] Procedure: 2D Echo, Cardiac Doppler and Color Doppler Indications:     Atrial Fibrillation 427.31 / I48.91  History:         Patient has no prior history of Echocardiogram examinations.                  Risk Factors:Hypertension.  Sonographer:     Alyse Low Roar Referring Phys:  [id number] Athena Masse Diagnosing Phys: Bartholome Bill MD IMPRESSIONS  1. Left ventricular ejection fraction, by estimation, is 55 to 60%. The left ventricle has normal function. The left ventricle has no regional wall motion abnormalities. Left ventricular diastolic parameters were normal.  2. Right ventricular systolic function is normal. The right ventricular size is normal.  3. The mitral valve is grossly normal. Trivial mitral valve regurgitation.  4. The aortic valve is grossly normal. Aortic valve regurgitation is mild. FINDINGS  Left Ventricle: Left ventricular ejection fraction, by estimation, is 55 to 60%. The left ventricle has normal function. The left ventricle has no regional wall motion abnormalities. The left ventricular internal cavity size was normal in size. There is  no left ventricular hypertrophy. Left ventricular diastolic parameters were normal. Right Ventricle: The right ventricular size is  normal. No increase in right ventricular wall thickness. Right ventricular systolic function is normal. Left Atrium: Left atrial size was normal in size. Right Atrium: Right atrial size was normal in size. Pericardium: There is no evidence of pericardial effusion. Mitral Valve: The mitral valve is grossly normal. Trivial mitral valve regurgitation. Tricuspid Valve: The tricuspid valve is not well visualized. Tricuspid valve regurgitation is mild. Aortic Valve: The aortic valve is grossly normal. Aortic valve regurgitation is mild. Aortic valve mean gradient measures 4.0 mmHg. Aortic valve peak gradient measures 6.7 mmHg. Aortic valve area, by VTI measures 1.71 cm. Pulmonic Valve: The pulmonic valve was not well visualized. Pulmonic valve regurgitation is trivial. Aorta:  The aortic root is normal in size and structure. IAS/Shunts: The interatrial septum was not assessed.  LEFT VENTRICLE PLAX 2D LVIDd:         4.04 cm  Diastology LVIDs:         3.01 cm  LV e' medial:    11.90 cm/s LV PW:         0.97 cm  LV E/e' medial:  8.8 LV IVS:        1.05 cm  LV e' lateral:   12.10 cm/s LVOT diam:     1.80 cm  LV E/e' lateral: 8.7 LV SV:         45 LV SV Index:   24 LVOT Area:     2.54 cm  RIGHT VENTRICLE RV Mid diam:    2.65 cm RV S prime:     13.20 cm/s TAPSE (M-mode): 1.6 cm LEFT ATRIUM             Index       RIGHT ATRIUM           Index LA diam:        3.25 cm 1.72 cm/m  RA Area:     14.30 cm LA Vol (A2C):   36.3 ml 19.21 ml/m RA Volume:   30.70 ml  16.25 ml/m LA Vol (A4C):   48.7 ml 25.77 ml/m LA Biplane Vol: 42.5 ml 22.49 ml/m  AORTIC VALVE                   PULMONIC VALVE AV Area (Vmax):    2.09 cm    PV Vmax:       0.87 m/s AV Area (Vmean):   2.10 cm    PV Peak grad:  3.0 mmHg AV Area (VTI):     1.71 cm AV Vmax:           129.00 cm/s AV Vmean:          95.100 cm/s AV VTI:            0.261 m AV Peak Grad:      6.7 mmHg AV Mean Grad:      4.0 mmHg LVOT Vmax:         106.00 cm/s LVOT Vmean:        78.600 cm/s  LVOT VTI:          0.175 m LVOT/AV VTI ratio: 0.67  AORTA Ao Root diam: 3.00 cm MITRAL VALVE                TRICUSPID VALVE MV Area (PHT): 5.23 cm     TR Peak grad:   21.3 mmHg MV Decel Time: 145 msec     TR Vmax:        231.00 cm/s MV E velocity: 105.00 cm/s                             SHUNTS                             Systemic VTI:  0.18 m                             Systemic Diam: 1.80 cm Bartholome Bill MD Electronically signed by Bartholome Bill MD Signature Date/Time: 03/25/2020/1:34:15 PM    Final      Scheduled Meds: .  albuterol  2 puff Inhalation Q6H  . vitamin C  500 mg Oral Daily  . enoxaparin (LOVENOX) injection  40 mg Subcutaneous Q2200  . metoprolol tartrate  25 mg Oral BID  . zinc sulfate  220 mg Oral Daily   Continuous Infusions: . remdesivir 100 mg in NS 100 mL Stopped (03/25/20 1326)     LOS: 1 day     Enzo Bi, MD [facility] If 7PM-7AM, please contact night-coverage 03/25/2020, 4:25 PM

## 2020-03-25 NOTE — Progress Notes (Signed)
*  PRELIMINARY RESULTS* Echocardiogram 2D Echocardiogram has been performed.  Darryl Haley 03/25/2020, 12:39 PM

## 2020-03-25 NOTE — ED Notes (Signed)
Pt given lunch tray.

## 2020-03-25 NOTE — ED Notes (Signed)
Patient is IVC pending inpatient admit 

## 2020-03-25 NOTE — ED Notes (Signed)
CT at bedside 

## 2020-03-25 NOTE — ED Notes (Signed)
Pt removed monitoring equipment  

## 2020-03-25 NOTE — ED Notes (Signed)
Pt linens saturated in urine- pt cleansed and linens changed- clean brief and profit placed

## 2020-03-25 NOTE — ED Notes (Signed)
Message sent to pharmacy for missing remdesivir

## 2020-03-25 NOTE — ED Notes (Signed)
Pt bed alarm going off- pt redirected to sit back in bed and monitoring equipment replaced on pt

## 2020-03-25 NOTE — ED Notes (Signed)
Breakfast tray placed in room- pt resting with eyes closed and even respirations

## 2020-03-25 NOTE — ED Notes (Signed)
Echo at bedside

## 2020-03-25 NOTE — ED Notes (Signed)
Pt taken to CT.

## 2020-03-25 NOTE — ED Notes (Signed)
Admitting MD at bedside.

## 2020-03-26 DIAGNOSIS — G9341 Metabolic encephalopathy: Secondary | ICD-10-CM | POA: Diagnosis not present

## 2020-03-26 LAB — BASIC METABOLIC PANEL
Anion gap: 13 (ref 5–15)
BUN: 45 mg/dL — ABNORMAL HIGH (ref 8–23)
CO2: 25 mmol/L (ref 22–32)
Calcium: 8.5 mg/dL — ABNORMAL LOW (ref 8.9–10.3)
Chloride: 109 mmol/L (ref 98–111)
Creatinine, Ser: 1.03 mg/dL (ref 0.61–1.24)
GFR, Estimated: >60 mL/min (ref >60)
Glucose, Bld: 98 mg/dL (ref 70–99)
Potassium: 4 mmol/L (ref 3.5–5.1)
Sodium: 147 mmol/L — ABNORMAL HIGH (ref 135–145)

## 2020-03-26 LAB — CBC
HCT: 45.3 % (ref 39.0–52.0)
Hemoglobin: 15.5 g/dL (ref 13.0–17.0)
MCH: 30.3 pg (ref 26.0–34.0)
MCHC: 34.2 g/dL (ref 30.0–36.0)
MCV: 88.5 fL (ref 80.0–100.0)
Platelets: 202 10*3/uL (ref 150–400)
RBC: 5.12 MIL/uL (ref 4.22–5.81)
RDW: 13.5 % (ref 11.5–15.5)
WBC: 9.4 10*3/uL (ref 4.0–10.5)
nRBC: 0 % (ref 0.0–0.2)

## 2020-03-26 LAB — FIBRIN DERIVATIVES D-DIMER (ARMC ONLY): Fibrin derivatives D-dimer (ARMC): >7500 ng/mL (FEU) — ABNORMAL HIGH (ref 0.00–499.00)

## 2020-03-26 LAB — C-REACTIVE PROTEIN: CRP: 6.2 mg/dL — ABNORMAL HIGH (ref <1.0)

## 2020-03-26 LAB — GLUCOSE, CAPILLARY: Glucose-Capillary: 141 mg/dL — ABNORMAL HIGH (ref 70–99)

## 2020-03-26 LAB — MAGNESIUM: Magnesium: 2.8 mg/dL — ABNORMAL HIGH (ref 1.7–2.4)

## 2020-03-26 MED ORDER — HALOPERIDOL LACTATE 5 MG/ML IJ SOLN
2.0000 mg | Freq: Four times a day (QID) | INTRAMUSCULAR | Status: DC | PRN
  Administered 2020-03-26 – 2020-04-02 (×7): 2 mg via INTRAVENOUS
  Filled 2020-03-26 (×7): qty 1

## 2020-03-26 NOTE — Progress Notes (Signed)
Patient resting in bed. Is very restless. On assessment this morning, was found with gown off and he had removed telemetry monitor and pulse ox probe. He was attempting to remove IVs as well. Redirected patient to leave equipment on for his benefit. Reapplied gown and fastened it around his neck. He has again pulled off his oxygen probe, will notify DR regarding issue. While in room, he had a coughing spell and heart rate had increased into the 150s. After 5 minutes had apassed his coughing spell went away and his heart rate returned to 70-80s. He is resting in bed low position with bed alarm on, call bell in reach. Socks are present along with fall mats for precaution. Spoke with daughter Kathie Rhodes this morning regarding patient.

## 2020-03-26 NOTE — Progress Notes (Signed)
PROGRESS NOTE    Riggs Dineen  TTS:[id number] DOB: 1930-01-12 DOA: 03/24/2020 PCP: Pcp, No    Assessment & Plan:   Principal Problem:   Acute metabolic encephalopathy Active Problems:   Pneumonia due to COVID-19 virus   AKI (acute kidney injury) ([place])   Elevated troponin   Rapid atrial fibrillation, new onset (HCC)   Inadequate oral nutritional intake   Cognitive dysfunction   Unintentional weight loss   Elevated blood-pressure reading without prior diagnosis of hypertension    Cael Worth is a 86 y.o. male with no significant past medical history and who has not seen a doctor in 8 years and who lives independently but whose son checks in on him twice a day, who was brought in under involuntary commitment due to concerns for confusion, lethargy and not eating. Most of the history is taken from his son who states that patient was in his usual state of health until the past 2 weeks when he appeared more confused than his baseline.  At baseline he is independent in his ADLs but has been noted to have some memory problems over the past 5 months.     # AMS 2/2 COVID encephalopathy # Suspect underlying cognitive dysfunction and dementia -Patient presents with 2-week history of confusion and paranoid ideation, lethargy, with 1-monthhistory of cognitive dysfunction. Independent in ADLs at baseline. -Likely multifactorial related to Covid, dehydration PLAN: -Treat acute etiologies, outlined below -Fall and aspiration precautions -Haldol PRN for agitation    Pneumonia due to COVID-19 virus -Patient with 5-day history of cough but no shortness of breath or fever and not hypoxic -Covid PCR positive with chest x-ray showing multifocal airspace opacities PLAN: --cont Remdesivir --cont oral Decadron -albuterol, antitussives and vitamins  Acute PE, POA --D-dimer >7500 on presentation.  CTA chest positive for PE. PLAN: --cont Eliquis    [place] -Creatinine of  1.55 with BUN 62, suspect prerenal related to decreased oral intake over the past 2 weeks related to his acute medical condition.  Cr improved with IVF. PLAN: --Hold further IVF and encourage oral hydration.    Rapid [place], new onset [place] -EKG with rapid A. fib rate 121 -CHA2DS2-VASc score of 3 based on age of 89and suspect underlying undiagnosed hypertension --rate controlled with Metoprolol 25 mg twice daily PLAN: --cont metop 25 mg BID --cont Eliquis (started for PE)    Elevated troponin 2/2 demand ischemia -Troponin 40 and flat.  Suspect supply demand mismatch related to rapid A. fib and Covid pneumonia    Inadequate oral nutritional intake   Unintentional weight loss, suspect protein calorie malnutrition unspecified -Son reports weight loss of 50 pounds over the past 5 months with decreased oral intake over the past 2 weeks -Nutritionist evaluation     Elevated blood-pressure reading without prior diagnosis of hypertension Suspect essential hypertension -BP 167/98 on arrival and remaining elevated -Suspect undiagnosed hypertension. Patient has not seen a physician in 8 years PLAN: --cont metop 25 mg BID   DVT prophylaxis: OSP:[id number] Status: Full code  Family Communication:  Status is: inpatient Dispo:   The patient is from: home Anticipated d/c is to: undetermined Anticipated d/c date is: > 3 days Patient currently is not medically stable to d/c due to: COVID encephalopathy, on treatment   Subjective and Interval History:  Pt was confused, not oriented, and getting agitated towards the end of the day.  No respiratory distress.   Objective: Vitals:   03/25/20 2049 03/26/20 0030  03/26/20 0507 03/26/20 0906  BP: 137/73 (!) 154/81 (!) 141/67 122/70  Pulse: 78 78 76 78  Resp: _0 Temp: (!) 97.5 F (36.4 C) 98.2 F (36.8 C) 97.7 F (36.5 C) 98.6 F (37 C)  TempSrc: Oral Oral Oral Oral  SpO2: 99% 96% 96% 95%  Weight: 65.5 kg      Height: _1  (1.676 m)      No intake or output data in the 24 hours ending 03/26/20 1635 Filed Weights   03/24/20 1646 03/25/20 2049  Weight: 79.4 kg 65.5 kg    Examination:   Constitutional: NAD, alert, not oriented HEENT: conjunctivae and lids normal, EOMI CV: No cyanosis.   RESP: normal respiratory effort, on RA Extremities: No effusions, edema in BLE SKIN: warm, dry and intact Neuro: II - XII grossly intact.      Data Reviewed: I have personally reviewed following labs and imaging studies  CBC: Recent Labs  Lab 03/24/20 1656 03/25/20 0631 03/26/20 0427  WBC 12.4* 8.2 9.4  NEUTROABS  --  6.9  --   HGB 17.1* 14.4 15.5  HCT 51.3 43.4 45.3  MCV 89.2 89.1 88.5  PLT 261 184 388   Basic Metabolic Panel: Recent Labs  Lab 03/24/20 1656 03/25/20 0631 03/26/20 0427  NA 141 143 147*  K 4.3 4.4 4.0  CL 99 105 109  CO2 _2 GLUCOSE 136* 94 98  BUN 62* 51* 45*  CREATININE 1.51* 1.08 1.03  CALCIUM 9.4 8.3* 8.5*  MG  --  2.7* 2.8*   GFR: Estimated Creatinine Clearance: 44.7 mL/min (by C-G formula based on SCr of 1.03 mg/dL). Liver Function Tests: Recent Labs  Lab 03/24/20 1656 03/25/20 0631  AST 46* 36  ALT 27 19  ALKPHOS 69 54  BILITOT 2.0* 1.7*  PROT 7.8 5.9*  ALBUMIN 3.6 2.8*   No results for input(s): LIPASE, AMYLASE in the last 168 hours. No results for input(s): AMMONIA in the last 168 hours. Coagulation Profile: No results for input(s): INR, PROTIME in the last 168 hours. Cardiac Enzymes: No results for input(s): CKTOTAL, CKMB, CKMBINDEX, TROPONINI in the last 168 hours. BNP (last 3 results) No results for input(s): PROBNP in the last 8760 hours. HbA1C: No results for input(s): HGBA1C in the last 72 hours. CBG: No results for input(s): GLUCAP in the last 168 hours. Lipid Profile: No results for input(s): CHOL, HDL, LDLCALC, TRIG, CHOLHDL, LDLDIRECT in the last 72 hours. Thyroid Function Tests: Recent Labs    03/24/20 1847  TSH  1.620   Anemia Panel: Recent Labs    03/25/20 0631  FERRITIN 196   Sepsis Labs: Recent Labs  Lab 03/25/20 0631  PROCALCITON <0.10    Recent Results (from the past 240 hour(s))  Resp Panel by RT-PCR (Flu A&B, Covid) Nasopharyngeal Swab     Status: Abnormal   Collection Time: 03/24/20  7:17 PM   Specimen: Nasopharyngeal Swab; Nasopharyngeal(NP) swabs in vial transport medium  Result Value Ref Range Status   SARS Coronavirus 2 by RT PCR POSITIVE (A) NEGATIVE Final    Comment: RESULT CALLED TO, READ BACK BY AND VERIFIED WITH: MAC BROWN,RN 2101 03/24/2020 DB (NOTE) SARS-CoV-2 target nucleic acids are DETECTED.  The SARS-CoV-2 RNA is generally detectable in upper respiratory specimens during the acute phase of infection. Positive results are indicative of the presence of the identified virus, but do not rule out bacterial infection or co-infection with other pathogens not detected by the test. Clinical  correlation with patient history and other diagnostic information is necessary to determine patient infection status. The expected result is Negative.  Fact Sheet for Patients: EntrepreneurPulse.com.au  Fact Sheet for Healthcare Providers: IncredibleEmployment.be  This test is not yet approved or cleared by the [place] FDA and  has been authorized for detection and/or diagnosis of SARS-CoV-2 by FDA under an Emergency Use Authorization (EUA).  This EUA will remain in effect (meaning this test can be u sed) for the duration of  the COVID-19 declaration under Section 564(b)(1) of the Act, 21 U.S.C. section 360bbb-3(b)(1), unless the authorization is terminated or revoked sooner.     Influenza A by PCR NEGATIVE NEGATIVE Final   Influenza B by PCR NEGATIVE NEGATIVE Final    Comment: (NOTE) The Xpert Xpress SARS-CoV-2/FLU/RSV plus assay is intended as an aid in the diagnosis of influenza from Nasopharyngeal swab specimens and should not  be used as a sole basis for treatment. Nasal washings and aspirates are unacceptable for Xpert Xpress SARS-CoV-2/FLU/RSV testing.  Fact Sheet for Patients: EntrepreneurPulse.com.au  Fact Sheet for Healthcare Providers: IncredibleEmployment.be  This test is not yet approved or cleared by the [place] FDA and has been authorized for detection and/or diagnosis of SARS-CoV-2 by FDA under an Emergency Use Authorization (EUA). This EUA will remain in effect (meaning this test can be used) for the duration of the COVID-19 declaration under Section 564(b)(1) of the Act, 21 U.S.C. section 360bbb-3(b)(1), unless the authorization is terminated or revoked.  Performed at [facility], [address], [place], [place] [address]       Radiology Studies: CT Head Wo Contrast  Result Date: 03/24/2020 CLINICAL DATA:  86 year old male with altered mental status. EXAM: CT HEAD WITHOUT CONTRAST TECHNIQUE: Contiguous axial images were obtained from the base of the skull through the vertex without intravenous contrast. COMPARISON:  None. FINDINGS: Brain: Mild age-related atrophy and chronic microvascular ischemic changes. There is no acute intracranial hemorrhage. No mass effect or midline shift. No extra-axial fluid collection. Vascular: No hyperdense vessel or unexpected calcification. Skull: Normal. Negative for fracture or focal lesion. Sinuses/Orbits: No acute finding. Other: None IMPRESSION: 1. No acute intracranial pathology. 2. Mild age-related atrophy and chronic microvascular ischemic changes. Electronically Signed   By: Anner Crete M.D.   On: 03/24/2020 19:27   CT ANGIO CHEST PE W OR WO CONTRAST  Result Date: 03/25/2020 CLINICAL DATA:  COVID positive patient. Shortness of breath, weakness and confusion. EXAM: CT ANGIOGRAPHY CHEST WITH CONTRAST TECHNIQUE: Multidetector CT imaging of the chest was performed using the standard protocol during  bolus administration of intravenous contrast. Multiplanar CT image reconstructions and MIPs were obtained to evaluate the vascular anatomy. CONTRAST:  20m OMNIPAQUE IOHEXOL 350 MG/ML SOLN COMPARISON:  None. FINDINGS: Cardiovascular: Mild cardiac enlargement. 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 Aortic atherosclerosis. Coronary artery atherosclerotic calcification. The main pulmonary artery appears patent. No obstructing pulmonary emboli. Filling defect within the segmental branch to the lingula is identified, image 161/5. A second filling defect is identified within a segmental branch to the lateral right lung base, image 196/5. Mediastinum/Nodes: No thyroid nodules. Trachea appears patent and midline. Unremarkable appearance of the esophagus. There is no axillary, supraclavicular, mediastinal adenopathy. Lungs/Pleura: No pleural effusion. Bilateral, peripheral and basal predominant ground-glass opacities and consolidation with extensive geographic distribution. Imaging findings consistent with COVID pneumonia. Upper Abdomen: No acute findings Musculoskeletal: Thoracic spondylosis. No acute or suspicious osseous findings. Review of the MIP images confirms  the above findings. IMPRESSION: 1. Examination is positive for acute pulmonary emboli within the segmental branches to the lingula and right lateral lung base. 2. Bilateral, peripheral and basal predominant ground-glass opacities and consolidation with extensive geographic distribution. Imaging findings consistent with COVID pneumonia. 3. Aortic atherosclerosis. Coronary artery calcifications noted. Aortic Atherosclerosis (ICD10-I70.0). Critical Value/emergent results were called by telephone at the time of interpretation on 03/25/2020 at 10:44 am to provider Lavonia Drafts, MD, who verbally acknowledged these results. Electronically Signed   By: Kerby Moors M.D.   On: 03/25/2020 10:44   DG Chest Portable 1  View  Result Date: 03/24/2020 CLINICAL DATA:  Weakness EXAM: PORTABLE CHEST 1 VIEW COMPARISON:  None. FINDINGS: Patchy mid to lower lung airspace opacities. No pleural effusion. Normal heart size. No pneumothorax. IMPRESSION: Patchy mid to lower lung airspace opacities bilaterally, suspicious for multifocal pneumonia. Electronically Signed   By: Donavan Foil M.D.   On: 03/24/2020 19:36   ECHOCARDIOGRAM COMPLETE  Result Date: 03/25/2020    ECHOCARDIOGRAM REPORT   Patient Name:   Rochester Lio Date of Exam: 03/25/2020 Medical Rec #:  [id number]   Height:       66.0 in Accession #:    [id number]  Weight:       175.0 lb Date of Birth:  January 10, 1930   BSA:          1.889 m Patient Age:    58 years    BP:           156/64 mmHg Patient Gender: M           HR:           81 bpm. Exam Location:  [facility] Procedure: 2D Echo, Cardiac Doppler and Color Doppler Indications:     Atrial Fibrillation 427.31 / I48.91  History:         Patient has no prior history of Echocardiogram examinations.                  Risk Factors:Hypertension.  Sonographer:     Alyse Low Roar Referring Phys:  [id number] Athena Masse Diagnosing Phys: Bartholome Bill MD IMPRESSIONS  1. Left ventricular ejection fraction, by estimation, is 55 to 60%. The left ventricle has normal function. The left ventricle has no regional wall motion abnormalities. Left ventricular diastolic parameters were normal.  2. Right ventricular systolic function is normal. The right ventricular size is normal.  3. The mitral valve is grossly normal. Trivial mitral valve regurgitation.  4. The aortic valve is grossly normal. Aortic valve regurgitation is mild. FINDINGS  Left Ventricle: Left ventricular ejection fraction, by estimation, is 55 to 60%. The left ventricle has normal function. The left ventricle has no regional wall motion abnormalities. The left ventricular internal cavity size was normal in size. There is  no left ventricular hypertrophy. Left ventricular diastolic  parameters were normal. Right Ventricle: The right ventricular size is normal. No increase in right ventricular wall thickness. Right ventricular systolic function is normal. Left Atrium: Left atrial size was normal in size. Right Atrium: Right atrial size was normal in size. Pericardium: There is no evidence of pericardial effusion. Mitral Valve: The mitral valve is grossly normal. Trivial mitral valve regurgitation. Tricuspid Valve: The tricuspid valve is not well visualized. Tricuspid valve regurgitation is mild. Aortic Valve: The aortic valve is grossly normal. Aortic valve regurgitation is mild. Aortic valve mean gradient measures 4.0 mmHg. Aortic valve peak gradient measures 6.7 mmHg. Aortic valve area, by VTI measures 1.71 cm.  Pulmonic Valve: The pulmonic valve was not well visualized. Pulmonic valve regurgitation is trivial. Aorta: The aortic root is normal in size and structure. IAS/Shunts: The interatrial septum was not assessed.  LEFT VENTRICLE PLAX 2D LVIDd:         4.04 cm  Diastology LVIDs:         3.01 cm  LV e' medial:    11.90 cm/s LV PW:         0.97 cm  LV E/e' medial:  8.8 LV IVS:        1.05 cm  LV e' lateral:   12.10 cm/s LVOT diam:     1.80 cm  LV E/e' lateral: 8.7 LV SV:         45 LV SV Index:   24 LVOT Area:     2.54 cm  RIGHT VENTRICLE RV Mid diam:    2.65 cm RV S prime:     13.20 cm/s TAPSE (M-mode): 1.6 cm LEFT ATRIUM             Index       RIGHT ATRIUM           Index LA diam:        3.25 cm 1.72 cm/m  RA Area:     14.30 cm LA Vol (A2C):   36.3 ml 19.21 ml/m RA Volume:   30.70 ml  16.25 ml/m LA Vol (A4C):   48.7 ml 25.77 ml/m LA Biplane Vol: 42.5 ml 22.49 ml/m  AORTIC VALVE                   PULMONIC VALVE AV Area (Vmax):    2.09 cm    PV Vmax:       0.87 m/s AV Area (Vmean):   2.10 cm    PV Peak grad:  3.0 mmHg AV Area (VTI):     1.71 cm AV Vmax:           129.00 cm/s AV Vmean:          95.100 cm/s AV VTI:            0.261 m AV Peak Grad:      6.7 mmHg AV Mean Grad:       4.0 mmHg LVOT Vmax:         106.00 cm/s LVOT Vmean:        78.600 cm/s LVOT VTI:          0.175 m LVOT/AV VTI ratio: 0.67  AORTA Ao Root diam: 3.00 cm MITRAL VALVE                TRICUSPID VALVE MV Area (PHT): 5.23 cm     TR Peak grad:   21.3 mmHg MV Decel Time: 145 msec     TR Vmax:        231.00 cm/s MV E velocity: 105.00 cm/s                             SHUNTS                             Systemic VTI:  0.18 m                             Systemic Diam: 1.80 cm Bartholome Bill MD Electronically signed by Bartholome Bill MD  Signature Date/Time: 03/25/2020/1:34:15 PM    Final      Scheduled Meds: . albuterol  2 puff Inhalation Q6H  . apixaban  10 mg Oral BID   Followed by  . [START ON 04/01/2020] apixaban  5 mg Oral BID  . vitamin C  500 mg Oral Daily  . dexamethasone  6 mg Oral Daily  . metoprolol tartrate  25 mg Oral BID  . zinc sulfate  220 mg Oral Daily   Continuous Infusions: . remdesivir 100 mg in NS 100 mL 100 mg (03/26/20 0825)     LOS: 2 days     Enzo Bi, MD [facility] If 7PM-7AM, please contact night-coverage 03/26/2020, 4:35 PM

## 2020-03-27 DIAGNOSIS — G9341 Metabolic encephalopathy: Secondary | ICD-10-CM | POA: Diagnosis not present

## 2020-03-27 DIAGNOSIS — E43 Unspecified severe protein-calorie malnutrition: Secondary | ICD-10-CM | POA: Insufficient documentation

## 2020-03-27 LAB — BASIC METABOLIC PANEL
Anion gap: 8 (ref 5–15)
BUN: 41 mg/dL — ABNORMAL HIGH (ref 8–23)
CO2: 27 mmol/L (ref 22–32)
Calcium: 8.5 mg/dL — ABNORMAL LOW (ref 8.9–10.3)
Chloride: 104 mmol/L (ref 98–111)
Creatinine, Ser: 1.11 mg/dL (ref 0.61–1.24)
GFR, Estimated: >60 mL/min (ref >60)
Glucose, Bld: 100 mg/dL — ABNORMAL HIGH (ref 70–99)
Potassium: 5 mmol/L (ref 3.5–5.1)
Sodium: 139 mmol/L (ref 135–145)

## 2020-03-27 LAB — CBC
HCT: 44.4 % (ref 39.0–52.0)
Hemoglobin: 15.2 g/dL (ref 13.0–17.0)
MCH: 30.3 pg (ref 26.0–34.0)
MCHC: 34.2 g/dL (ref 30.0–36.0)
MCV: 88.4 fL (ref 80.0–100.0)
Platelets: 199 10*3/uL (ref 150–400)
RBC: 5.02 MIL/uL (ref 4.22–5.81)
RDW: 13.3 % (ref 11.5–15.5)
WBC: 10 10*3/uL (ref 4.0–10.5)
nRBC: 0 % (ref 0.0–0.2)

## 2020-03-27 LAB — MAGNESIUM: Magnesium: 2.5 mg/dL — ABNORMAL HIGH (ref 1.7–2.4)

## 2020-03-27 LAB — C-REACTIVE PROTEIN: CRP: 4.8 mg/dL — ABNORMAL HIGH (ref <1.0)

## 2020-03-27 MED ORDER — ENSURE ENLIVE PO LIQD
237.0000 mL | Freq: Three times a day (TID) | ORAL | Status: DC
  Administered 2020-03-29 – 2020-04-04 (×18): 237 mL via ORAL

## 2020-03-27 MED ORDER — QUETIAPINE FUMARATE 25 MG PO TABS
100.0000 mg | ORAL_TABLET | Freq: Every day | ORAL | Status: DC
  Administered 2020-03-27 – 2020-04-03 (×7): 100 mg via ORAL
  Filled 2020-03-27 (×7): qty 4

## 2020-03-27 MED ORDER — PNEUMOCOCCAL VAC POLYVALENT 25 MCG/0.5ML IJ INJ
0.5000 mL | INJECTION | INTRAMUSCULAR | Status: AC
  Administered 2020-03-30: 0.5 mL via INTRAMUSCULAR
  Filled 2020-03-27: qty 0.5

## 2020-03-27 MED ORDER — INFLUENZA VAC A&B SA ADJ QUAD 0.5 ML IM PRSY
0.5000 mL | PREFILLED_SYRINGE | INTRAMUSCULAR | Status: AC
  Administered 2020-03-30: 15:00:00 0.5 mL via INTRAMUSCULAR
  Filled 2020-03-27: qty 0.5

## 2020-03-27 NOTE — Care Management Important Message (Signed)
Important Message  Patient Details  Name: Margues Filippini MRN: [id number] Date of Birth: 09-Feb-1930   Medicare Important Message Given:  Yes  IM left for RN to deliver due to patient's isolation status.   Revere Maahs E Rosela Supak, LCSW 03/27/2020, 2:13 PM

## 2020-03-27 NOTE — Progress Notes (Signed)
Spoke to daughter on the phone, given updates

## 2020-03-27 NOTE — Progress Notes (Signed)
Initial Nutrition Assessment  DOCUMENTATION CODES:   Severe malnutrition in context of social or environmental circumstances  INTERVENTION:  Liberalized diet to regular.  Provide Ensure Enlive po TID, each supplement provides 350 kcal and 20 grams of protein.   Provide Magic cup TID with meals, each supplement provides 290 kcal and 9 grams of protein.   NUTRITION DIAGNOSIS:   Severe Malnutrition related to social / environmental circumstances (suspected inadequate oral intake, suspected underlying cognitive dysfunction and dementia) as evidenced by severe fat depletion, moderate muscle depletion, severe muscle depletion.  GOAL:   Patient will meet greater than or equal to 90% of their needs  MONITOR:   PO intake, Supplement acceptance, Labs, Weight trends, I & O's  REASON FOR ASSESSMENT:   Malnutrition Screening Tool    ASSESSMENT:   86 year old male with no significant PMHx admitted with COVID-19 PNA, AMS suspected due to COVID encephalopathy, suspected underlying cognitive dysfunction and dementia, acute PE, AKI, rapid A-fib.   Met with patient at bedside. He was initially sleeping but did wake to name call. He only reports to RD that he is not hungry and he does not want to eat. He had finished 50% of his Ensure at bedside. Patient unable to provide any further history at this time. Per MD note patient has had decreased oral intake for the past 2 weeks.  No weight history documented in chart. Patient is currently 65.5 kg (144.4 lbs). Per MD note patient has had a weight loss of 50 lbs over the past 5 months. That would come out to 25.7% body weight, which is significant for time frame.  Medications reviewed and include: vitamin C 500 mg daily, Decadron 6 mg daily, zinc sulfate 220 mg daily, remdesivir.  Labs reviewed: CBG 141, BUN 41, Magnesium 2.5.  Discussed with RN. Patient has not wanted to eat today. Plan is for family to bring in food from home and also pictures of  family to put in patient's room.  Discussed with MD. Molli Knock to liberalized diet to regular.  NUTRITION - FOCUSED PHYSICAL EXAM:    Most Recent Value  Orbital Region Severe depletion  Upper Arm Region Severe depletion  Thoracic and Lumbar Region Moderate depletion  Buccal Region Severe depletion  Temple Region Severe depletion  Clavicle Bone Region Severe depletion  Clavicle and Acromion Bone Region Severe depletion  Scapular Bone Region Moderate depletion  Dorsal Hand Severe depletion  Patellar Region Moderate depletion  Anterior Thigh Region Moderate depletion  Posterior Calf Region Severe depletion  Edema (RD Assessment) None  Hair Reviewed  Eyes Reviewed  Mouth Reviewed  Skin Reviewed  Nails Reviewed     Diet Order:   Diet Order            Diet Heart Room service appropriate? Yes; Fluid consistency: Thin  Diet effective now                EDUCATION NEEDS:   No education needs have been identified at this time  Skin:  Skin Assessment: Reviewed RN Assessment  Last BM:  Unknown  Height:   Ht Readings from Last 1 Encounters:  03/25/20 5\' 6"  (1.676 m)   Weight:   Wt Readings from Last 1 Encounters:  03/25/20 65.5 kg   Ideal Body Weight:  64.5 kg  BMI:  Body mass index is 23.31 kg/m.  Estimated Nutritional Needs:   Kcal:  1800-2000  Protein:  95-105 grams  Fluid:  1.6-1.8 L/day  03/27/20, MS, RD,  LDN Pager number available on Amion

## 2020-03-27 NOTE — Progress Notes (Signed)
PT Cancellation Note  Patient Details Name: Darryl Haley MRN: [id number] DOB: 10-Jul-1929   Cancelled Treatment:    Reason Eval/Treat Not Completed: Other (comment). Pt minimally verbal during PT attempt to initiate evaluation, unclear if pt lethargy or motivation to participate with therapy services limited pts abilities. PT to re-attempt as able.  Olga Coaster PT, DPT 3:36 PM,03/27/20

## 2020-03-27 NOTE — Progress Notes (Addendum)
PROGRESS NOTE    Nobel Brar  STM:[id number] DOB: 1929/09/19 DOA: 03/24/2020 PCP: Pcp, No    Assessment & Plan:   Principal Problem:   Acute metabolic encephalopathy Active Problems:   Pneumonia due to COVID-19 virus   AKI (acute kidney injury) ([place])   Elevated troponin   Rapid atrial fibrillation, new onset (HCC)   Inadequate oral nutritional intake   Cognitive dysfunction   Unintentional weight loss   Elevated blood-pressure reading without prior diagnosis of hypertension    Darryl Haley is a 86 y.o. male with no significant past medical history and who has not seen a doctor in 8 years and who lives independently but whose son checks in on him twice a day, who was brought in under involuntary commitment due to concerns for confusion, lethargy and not eating. Most of the history is taken from his son who states that patient was in his usual state of health until the past 2 weeks when he appeared more confused than his baseline.  At baseline he is independent in his ADLs but has been noted to have some memory problems over the past 5 months.     # AMS 2/2 COVID encephalopathy # Suspect underlying cognitive dysfunction and dementia -Patient presents with 2-week history of confusion and paranoid ideation, lethargy, with 10-monthhistory of cognitive dysfunction. Independent in ADLs at baseline. -Likely multifactorial related to Covid, dehydration PLAN: -Treat acute etiologies, outlined below -Fall and aspiration precautions --Seroquel 100 mg nightly for delirium and agitation -Haldol PRN for agitation    Pneumonia due to COVID-19 virus -Patient with 5-day history of cough but no shortness of breath or fever and not hypoxic -Covid PCR positive with chest x-ray showing multifocal airspace opacities PLAN: --cont Remdesivir --cont oral Decadron -albuterol, antitussives and vitamins  Acute PE, POA --D-dimer >7500 on presentation.  CTA chest positive for PE. PLAN: --cont  Eliquis    AKI (acute kidney injury) ([place] -Creatinine of 1.55 with BUN 62, suspect prerenal related to decreased oral intake over the past 2 weeks related to his acute medical condition.  Cr improved with IVF. PLAN: --Hold further IVF and encourage oral hydration.    Rapid [place], [place] -EKG with rapid A. fib rate 121 -CHA2DS2-VASc score of 3 based on age of 89and suspect underlying undiagnosed hypertension --rate controlled with Metoprolol 25 mg twice daily PLAN: --cont metop 25 mg BID --cont Eliquis (started for PE)    Elevated troponin 2/2 demand ischemia -Troponin 40 and flat.  Suspect supply demand mismatch related to rapid A. fib and Covid pneumonia    Inadequate oral nutritional intake   Unintentional weight loss Severe malnutrition  -Son reports weight loss of 50 pounds over the past 5 months with decreased oral intake over the past 2 weeks --liberalize diet to regular --Ensure TID    Elevated blood-pressure reading without prior diagnosis of hypertension Suspect essential hypertension -BP 167/98 on arrival and remaining elevated -Suspect undiagnosed hypertension. Patient has not seen a physician in 8 years PLAN: --cont metop 25 mg BID   DVT prophylaxis: OGX:[id number] Status: Full code  Family Communication: daughter updated on the phone today Status is: inpatient Dispo:   The patient is from: home Anticipated d/c is to: undetermined Anticipated d/c date is: > 3 days Patient currently is not medically stable to d/c due to: COVID encephalopathy, not eating   Subjective and Interval History:  Pt was somnolent, confused, didn't answer questions.     Objective: Vitals:  03/26/20 2300 03/27/20 0300 03/27/20 0824 03/27/20 1225  BP: 126/68 124/73 114/65 (!) 111/58  Pulse: 78 76 (!) 54 (!) 58  Resp: _0 Temp: 97.7 F (36.5 C) 97.6 F (36.4 C) 98.6 F (37 C) 97.8 F (36.6 C)  TempSrc: Oral Oral    SpO2:  99% 98% 97%   Weight:      Height:       No intake or output data in the 24 hours ending 03/27/20 1420 Filed Weights   03/24/20 1646 03/25/20 2049  Weight: 79.4 kg 65.5 kg    Examination:   Constitutional: NAD, lethargic, not responsive CV: No cyanosis.   RESP: normal respiratory effort, on RA Extremities: No effusions, edema in BLE SKIN: warm, dry and intact    Data Reviewed: I have personally reviewed following labs and imaging studies  CBC: Recent Labs  Lab 03/24/20 1656 03/25/20 0631 03/26/20 0427 03/27/20 0641  WBC 12.4* 8.2 9.4 10.0  NEUTROABS  --  6.9  --   --   HGB 17.1* 14.4 15.5 15.2  HCT 51.3 43.4 45.3 44.4  MCV 89.2 89.1 88.5 88.4  PLT 261 184 202 242   Basic Metabolic Panel: Recent Labs  Lab 03/24/20 1656 03/25/20 0631 03/26/20 0427 03/27/20 0504 03/27/20 0641  NA 141 143 147* QUESTIONABLE IDENTIFICATION / INCORRECTLY LABELED SPECIMEN 139  K 4.3 4.4 4.0 QUESTIONABLE IDENTIFICATION / INCORRECTLY LABELED SPECIMEN 5.0  CL 99 105 109 QUESTIONABLE IDENTIFICATION / INCORRECTLY LABELED SPECIMEN 104  CO2 _1 QUESTIONABLE IDENTIFICATION / INCORRECTLY LABELED SPECIMEN 27  GLUCOSE 136* 94 98 QUESTIONABLE IDENTIFICATION / INCORRECTLY LABELED SPECIMEN 100*  BUN 62* 51* 45* QUESTIONABLE IDENTIFICATION / INCORRECTLY LABELED SPECIMEN 41*  CREATININE 1.51* 1.08 1.03 QUESTIONABLE IDENTIFICATION / INCORRECTLY LABELED SPECIMEN 1.11  CALCIUM 9.4 8.3* 8.5* QUESTIONABLE IDENTIFICATION / INCORRECTLY LABELED SPECIMEN 8.5*  MG  --  2.7* 2.8* QUESTIONABLE IDENTIFICATION / INCORRECTLY LABELED SPECIMEN 2.5*   GFR: Estimated Creatinine Clearance: 41.5 mL/min (by C-G formula based on SCr of 1.11 mg/dL). Liver Function Tests: Recent Labs  Lab 03/24/20 1656 03/25/20 0631  AST 46* 36  ALT 27 19  ALKPHOS 69 54  BILITOT 2.0* 1.7*  PROT 7.8 5.9*  ALBUMIN 3.6 2.8*   No results for input(s): LIPASE, AMYLASE in the last 168 hours. No results for input(s): AMMONIA in the last 168  hours. Coagulation Profile: No results for input(s): INR, PROTIME in the last 168 hours. Cardiac Enzymes: No results for input(s): CKTOTAL, CKMB, CKMBINDEX, TROPONINI in the last 168 hours. BNP (last 3 results) No results for input(s): PROBNP in the last 8760 hours. HbA1C: No results for input(s): HGBA1C in the last 72 hours. CBG: Recent Labs  Lab 03/26/20 2052  GLUCAP 141*   Lipid Profile: No results for input(s): CHOL, HDL, LDLCALC, TRIG, CHOLHDL, LDLDIRECT in the last 72 hours. Thyroid Function Tests: Recent Labs    03/24/20 1847  TSH 1.620   Anemia Panel: Recent Labs    03/25/20 0631  FERRITIN 196   Sepsis Labs: Recent Labs  Lab 03/25/20 0631  PROCALCITON <0.10    Recent Results (from the past 240 hour(s))  Resp Panel by RT-PCR (Flu A&B, Covid) Nasopharyngeal Swab     Status: Abnormal   Collection Time: 03/24/20  7:17 PM   Specimen: Nasopharyngeal Swab; Nasopharyngeal(NP) swabs in vial transport medium  Result Value Ref Range Status   SARS Coronavirus 2 by RT PCR POSITIVE (A) NEGATIVE Final    Comment: RESULT CALLED TO,  READ BACK BY AND VERIFIED WITH: MAC BROWN,RN 2101 03/24/2020 DB (NOTE) SARS-CoV-2 target nucleic acids are DETECTED.  The SARS-CoV-2 RNA is generally detectable in upper respiratory specimens during the acute phase of infection. Positive results are indicative of the presence of the identified virus, but do not rule out bacterial infection or co-infection with other pathogens not detected by the test. Clinical correlation with patient history and other diagnostic information is necessary to determine patient infection status. The expected result is Negative.  Fact Sheet for Patients: EntrepreneurPulse.com.au  Fact Sheet for Healthcare Providers: IncredibleEmployment.be  This test is not yet approved or cleared by the [place] FDA and  has been authorized for detection and/or diagnosis of  SARS-CoV-2 by FDA under an Emergency Use Authorization (EUA).  This EUA will remain in effect (meaning this test can be u sed) for the duration of  the COVID-19 declaration under Section 564(b)(1) of the Act, 21 U.S.C. section 360bbb-3(b)(1), unless the authorization is terminated or revoked sooner.     Influenza A by PCR NEGATIVE NEGATIVE Final   Influenza B by PCR NEGATIVE NEGATIVE Final    Comment: (NOTE) The Xpert Xpress SARS-CoV-2/FLU/RSV plus assay is intended as an aid in the diagnosis of influenza from Nasopharyngeal swab specimens and should not be used as a sole basis for treatment. Nasal washings and aspirates are unacceptable for Xpert Xpress SARS-CoV-2/FLU/RSV testing.  Fact Sheet for Patients: EntrepreneurPulse.com.au  Fact Sheet for Healthcare Providers: IncredibleEmployment.be  This test is not yet approved or cleared by the [place] FDA and has been authorized for detection and/or diagnosis of SARS-CoV-2 by FDA under an Emergency Use Authorization (EUA). This EUA will remain in effect (meaning this test can be used) for the duration of the COVID-19 declaration under Section 564(b)(1) of the Act, 21 U.S.C. section 360bbb-3(b)(1), unless the authorization is terminated or revoked.  Performed at [facility], [address] [place], [place] [address]       Radiology Studies: No results found.   Scheduled Meds: . albuterol  2 puff Inhalation Q6H  . apixaban  10 mg Oral BID   Followed by  . [START ON 04/01/2020] apixaban  5 mg Oral BID  . vitamin C  500 mg Oral Daily  . dexamethasone  6 mg Oral Daily  . [START ON 03/28/2020] influenza vaccine adjuvanted  0.5 mL Intramuscular Tomorrow-1000  . metoprolol tartrate  25 mg Oral BID  . [START ON 03/28/2020] pneumococcal 23 valent vaccine  0.5 mL Intramuscular Tomorrow-1000  . zinc sulfate  220 mg Oral Daily   Continuous Infusions: . remdesivir 100 mg in NS  100 mL 100 mg (03/27/20 1003)     LOS: 3 days     Enzo Bi, MD [facility] If 7PM-7AM, please contact night-coverage 03/27/2020, 2:20 PM

## 2020-03-28 DIAGNOSIS — G9341 Metabolic encephalopathy: Secondary | ICD-10-CM | POA: Diagnosis not present

## 2020-03-28 LAB — CBC
HCT: 47.2 % (ref 39.0–52.0)
Hemoglobin: 15.6 g/dL (ref 13.0–17.0)
MCH: 29.4 pg (ref 26.0–34.0)
MCHC: 33.1 g/dL (ref 30.0–36.0)
MCV: 88.9 fL (ref 80.0–100.0)
Platelets: 219 10*3/uL (ref 150–400)
RBC: 5.31 MIL/uL (ref 4.22–5.81)
RDW: 13.4 % (ref 11.5–15.5)
WBC: 8.7 10*3/uL (ref 4.0–10.5)
nRBC: 0 % (ref 0.0–0.2)

## 2020-03-28 LAB — BASIC METABOLIC PANEL
Anion gap: 10 (ref 5–15)
BUN: 34 mg/dL — ABNORMAL HIGH (ref 8–23)
CO2: 26 mmol/L (ref 22–32)
Calcium: 8.5 mg/dL — ABNORMAL LOW (ref 8.9–10.3)
Chloride: 104 mmol/L (ref 98–111)
Creatinine, Ser: 0.88 mg/dL (ref 0.61–1.24)
GFR, Estimated: >60 mL/min (ref >60)
Glucose, Bld: 100 mg/dL — ABNORMAL HIGH (ref 70–99)
Potassium: 4.1 mmol/L (ref 3.5–5.1)
Sodium: 140 mmol/L (ref 135–145)

## 2020-03-28 LAB — C-REACTIVE PROTEIN: CRP: 5.3 mg/dL — ABNORMAL HIGH (ref <1.0)

## 2020-03-28 LAB — MAGNESIUM: Magnesium: 2.6 mg/dL — ABNORMAL HIGH (ref 1.7–2.4)

## 2020-03-28 NOTE — Progress Notes (Signed)
Patient's daughter Kathie Rhodes updated via phone.

## 2020-03-28 NOTE — Evaluation (Signed)
Physical Therapy Evaluation Patient Details Name: Darryl Haley MRN: [id number] DOB: 20-Nov-1929 Today's Date: 03/28/2020   History of Present Illness  Patient is an 86 yo male that presented to ED with 2-week history of confusion and paranoid ideation, lethargy, with 80-month history of cognitive dysfunction. Admitted for PNA due to covid19. New onset of afib, elevated troponin due to demand ischemia noted in chart as well. work up showed acute PE in R lung (11/27).    Clinical Impression  Pt woke to touch and voice, denied pain. Pt is a questionable historian due to AMS. Per chart pt lives alone with a son who assists/checks on pt, but pt unable to recall. Denied use of AD, able to state he has some stairs but unable to provide further information. He stated his last name and birthday date/month. spoke 1-2 words at a time, very soft spoken. Intermittently lethargic, did not answer any further orientation questions. Able to follow one step commands ~60% of the time, needed facilitation to initate tasks.  Supine to sit with extended time and use of grab bars, ultimately maxA. Once positioned at EOB he was able to maintain his balance without UE for several minutes. Assisted for small sips of orange juice, pt unable to hold the cup. Sit <> Stand with RW and maxA, and was able to take several steps laterally/fowards/backwards at EOB with at least minA for RW assist and unsteadiness. Pt returned to supine maxA and bed placed in chair position.  Overall the patient demonstrated deficits (see "PT Problem List") that impede the patient's functional abilities, safety, and mobility and would benefit from skilled PT intervention. Recommendation is SNF due to acute decline in functional mobility and current level of assistance needed pending pt progress.      Follow Up Recommendations SNF    Equipment Recommendations  Rolling walker with 5" wheels    Recommendations for Other Services       Precautions /  Restrictions Precautions Precautions: Fall Restrictions Weight Bearing Restrictions: No      Mobility  Bed Mobility Overal bed mobility: Needs Assistance Bed Mobility: Supine to Sit;Sit to Supine     Supine to sit: Max assist Sit to supine: Max assist        Transfers Overall transfer level: Needs assistance Equipment used: Rolling walker (2 wheeled) Transfers: Sit to/from Stand Sit to Stand: Max assist            Ambulation/Gait             General Gait Details: Pt able to take several steps to his R towards HOB, as well as a few steps forwards/backwards with minA. Fatigued quickly  Information systems manager [employer] (Stroke Patients Only)       Balance Overall balance assessment: Needs assistance Sitting-balance support: Feet supported Sitting balance-[name] Scale: Fair Sitting balance - Comments: progressed to sitting EOB with close supervision     Standing balance-[name] Scale: Poor Standing balance comment: reliant on UE support/PT assist                             Pertinent Vitals/Pain Pain Assessment: No/denies pain    Home Living Family/patient expects to be discharged to:: Private residence Living Arrangements: Alone Available Help at Discharge: Family;Available PRN/intermittently Type of Home: House Home Access: Stairs to enter       Home Equipment: None  Additional Comments: Pt questionable historian. Per chart pt lives alone with a son who assists/checks on pat, pt unable to recall. Denied use of AD, able to state he has some stairs but unable to provide further information    Prior Function                 Hand Dominance        Extremity/Trunk Assessment   Upper Extremity Assessment Upper Extremity Assessment: Generalized weakness    Lower Extremity Assessment Lower Extremity Assessment: Generalized weakness;Difficult to assess due to impaired cognition    Cervical /  Trunk Assessment Cervical / Trunk Assessment: Kyphotic  Communication      Cognition Arousal/Alertness: Awake/alert;Lethargic Behavior During Therapy: Flat affect Overall Cognitive Status: Difficult to assess                                 General Comments: Pt able to state his last name and birthday date/month. spoke 1-2 words at a time, very soft spoken. Intermittently lethargic, did not answer any further orientation questions. Able to follow one step commands ~60% of the time, needed facilitation to initate tasks.      General Comments      Exercises Other Exercises Other Exercises: Bed placed in chair position, lights and TV turned on to encourage pt to be awake during the day   Assessment/Plan    PT Assessment Patient needs continued PT services  PT Problem List Decreased strength;Decreased mobility;Decreased safety awareness;Decreased activity tolerance;Decreased balance;Decreased knowledge of use of DME;Decreased cognition       PT Treatment Interventions DME instruction;Therapeutic exercise;Gait training;Balance training;Stair training;Neuromuscular re-education;Functional mobility training;Therapeutic activities;Patient/family education    PT Goals (Current goals can be found in the Care Plan section)  Acute Rehab PT Goals Patient Stated Goal: to rest PT Goal Formulation: With patient Time For Goal Achievement: 04/11/20 Potential to Achieve Goals: Good    Frequency Min 2X/week   Barriers to discharge Decreased caregiver support;Inaccessible home environment      Co-evaluation               AM-PAC PT "6 Clicks" Mobility  Outcome Measure Help needed turning from your back to your side while in a flat bed without using bedrails?: A Lot Help needed moving from lying on your back to sitting on the side of a flat bed without using bedrails?: A Lot Help needed moving to and from a bed to a chair (including a wheelchair)?: A Lot Help needed  standing up from a chair using your arms (e.g., wheelchair or bedside chair)?: A Lot Help needed to walk in hospital room?: A Lot Help needed climbing 3-5 steps with a railing? : Total 6 Click Score: 11    End of Session Equipment Utilized During Treatment: Gait belt Activity Tolerance: Patient limited by fatigue;Patient limited by lethargy Patient left: in bed;with call bell/phone within reach;with bed alarm set Nurse Communication: Mobility status PT Visit Diagnosis: Other abnormalities of gait and mobility (R26.89);Muscle weakness (generalized) (M62.81);Difficulty in walking, not elsewhere classified (R26.2)    Time: 6962-9528 PT Time Calculation (min) (ACUTE ONLY): 32 min   Charges:   PT Evaluation $PT Eval Low Complexity: 1 Low PT Treatments $Therapeutic Exercise: 8-22 mins        Olga Coaster PT, DPT 11:44 AM,03/28/20

## 2020-03-28 NOTE — Progress Notes (Signed)
Patient moved to low bed per protocol. Bed alarm activated and verified working appropriately. Call light and phone within reach. Floor mats placed back at bedside.

## 2020-03-28 NOTE — Progress Notes (Signed)
PROGRESS NOTE    Darryl Haley  XMI:[id number] DOB: [birth year] DOA: 03/24/2020 PCP: Pcp, No    Assessment & Plan:   Principal Problem:   Acute metabolic encephalopathy Active Problems:   Pneumonia due to COVID-19 virus   AKI (acute kidney injury) ([place])   Elevated troponin   Rapid atrial fibrillation, new onset (HCC)   Inadequate oral nutritional intake   Cognitive dysfunction   Unintentional weight loss   Elevated blood-pressure reading without prior diagnosis of hypertension   Protein-calorie malnutrition, severe    Darryl Haley is a 86 y.o. male with no significant past medical history and who has not seen a doctor in 8 years and who lives independently but whose son checks in on him twice a day, who was brought in under involuntary commitment due to concerns for confusion, lethargy and not eating. Most of the history is taken from his son who states that patient was in his usual state of health until the past 2 weeks when he appeared more confused than his baseline.  At baseline he is independent in his ADLs but has been noted to have some memory problems over the past 5 months.     # AMS 2/2 COVID encephalopathy # Suspect underlying cognitive dysfunction and dementia -Patient presents with 2-week history of confusion and paranoid ideation, lethargy, with 42-monthhistory of cognitive dysfunction. Independent in ADLs at baseline. -Likely multifactorial related to Covid, dehydration PLAN: -Treat acute etiologies, outlined below -Fall and aspiration precautions --Seroquel 100 mg nightly for delirium and agitation -Haldol PRN for agitation --Palliative consult placed today    Pneumonia due to COVID-19 virus -Patient with 5-day history of cough but no shortness of breath or fever and not hypoxic -Covid PCR positive with chest x-ray showing multifocal airspace opacities --completed Remdesivir. PLAN: --cont oral decadron --Ensure CRP down-trending -albuterol, antitussives and  vitamins  Acute PE, POA --D-dimer >7500 on presentation.  CTA chest positive for PE. PLAN: --cont Eliquis    AKI ([place]) ([place] -Creatinine of 1.55 with BUN 62, suspect prerenal related to decreased oral intake over the past 2 weeks related to his acute medical condition.  Cr improved with IVF. PLAN: --Hold further IVF and encourage oral hydration.    Rapid [place], new onset [place] -EKG with rapid A. fib rate 121 -CHA2DS2-VASc score of 3 based on age of 89and suspect underlying undiagnosed hypertension --rate controlled with Metoprolol 25 mg twice daily PLAN: --cont metop 25 mg BID --cont Eliquis (started for PE)    Elevated troponin 2/2 demand ischemia -Troponin 40 and flat.  Suspect supply demand mismatch related to rapid A. fib and Covid pneumonia    Inadequate oral nutritional intake   Unintentional weight loss Severe malnutrition  -Son reports weight loss of 50 pounds over the past 5 months with decreased oral intake over the past 2 weeks --liberalize diet to regular --Ensure TID --palliative consult placed today    Elevated blood-pressure reading without prior diagnosis of hypertension Suspect essential hypertension -BP 167/98 on arrival and remaining elevated -Suspect undiagnosed hypertension. Patient has not seen a physician in 8 years PLAN: --cont metop 25 mg BID   DVT prophylaxis: OMG:NOIBBCWCode Status: Full code  Family Communication: daughter updated on the phone on 11/29 Status is: inpatient Dispo:   The patient is from: home Anticipated d/c is to: SNF Anticipated d/c date is: when SNF bed available Patient currently is medically stable to d/c.   Subjective and Interval History:  Pt was mostly  sleeping, but when awaken, was found to be aggressive sometimes towards nursing.  Not eating on his own.  Was able to work with PT today.   Objective: Vitals:   03/28/20 0512 03/28/20 0850 03/28/20 1046 03/28/20 1230  BP:  140/90 (!) 146/83  (!) 146/95  Pulse: 71 (!) 59  72  Resp: _0 Temp: 97.7 F (36.5 C) (!) 97.4 F (36.3 C)  97.6 F (36.4 C)  TempSrc:  Oral    SpO2: 97% 96%  97%  Weight:   65.6 kg   Height:       No intake or output data in the 24 hours ending 03/28/20 1514 Filed Weights   03/24/20 1646 03/25/20 2049 03/28/20 1046  Weight: 79.4 kg 65.5 kg 65.6 kg    Examination:   Constitutional: NAD, sleeping but arousable, not oriented, confused HEENT: conjunctivae and lids normal, EOMI CV: No cyanosis.   RESP: normal respiratory effort, on RA Extremities: No effusions, edema in BLE SKIN: warm, dry and intact   Data Reviewed: I have personally reviewed following labs and imaging studies  CBC: Recent Labs  Lab 03/24/20 1656 03/25/20 0631 03/26/20 0427 03/27/20 0641 03/28/20 0757  WBC 12.4* 8.2 9.4 10.0 8.7  NEUTROABS  --  6.9  --   --   --   HGB 17.1* 14.4 15.5 15.2 15.6  HCT 51.3 43.4 45.3 44.4 47.2  MCV 89.2 89.1 88.5 88.4 88.9  PLT 261 184 202 199 480   Basic Metabolic Panel: Recent Labs  Lab 03/25/20 0631 03/26/20 0427 03/27/20 0504 03/27/20 0641 03/28/20 0757  NA 143 147* QUESTIONABLE IDENTIFICATION / INCORRECTLY LABELED SPECIMEN 139 140  K 4.4 4.0 QUESTIONABLE IDENTIFICATION / INCORRECTLY LABELED SPECIMEN 5.0 4.1  CL 105 109 QUESTIONABLE IDENTIFICATION / INCORRECTLY LABELED SPECIMEN 104 104  CO2 26 25 QUESTIONABLE IDENTIFICATION / INCORRECTLY LABELED SPECIMEN 27 26  GLUCOSE 94 98 QUESTIONABLE IDENTIFICATION / INCORRECTLY LABELED SPECIMEN 100* 100*  BUN 51* 45* QUESTIONABLE IDENTIFICATION / INCORRECTLY LABELED SPECIMEN 41* 34*  CREATININE 1.08 1.03 QUESTIONABLE IDENTIFICATION / INCORRECTLY LABELED SPECIMEN 1.11 0.88  CALCIUM 8.3* 8.5* QUESTIONABLE IDENTIFICATION / INCORRECTLY LABELED SPECIMEN 8.5* 8.5*  MG 2.7* 2.8* QUESTIONABLE IDENTIFICATION / INCORRECTLY LABELED SPECIMEN 2.5* 2.6*   GFR: Estimated Creatinine Clearance: 52.4 mL/min (by C-G formula based  on SCr of 0.88 mg/dL). Liver Function Tests: Recent Labs  Lab 03/24/20 1656 03/25/20 0631  AST 46* 36  ALT 27 19  ALKPHOS 69 54  BILITOT 2.0* 1.7*  PROT 7.8 5.9*  ALBUMIN 3.6 2.8*   No results for input(s): LIPASE, AMYLASE in the last 168 hours. No results for input(s): AMMONIA in the last 168 hours. Coagulation Profile: No results for input(s): INR, PROTIME in the last 168 hours. Cardiac Enzymes: No results for input(s): CKTOTAL, CKMB, CKMBINDEX, TROPONINI in the last 168 hours. BNP (last 3 results) No results for input(s): PROBNP in the last 8760 hours. HbA1C: No results for input(s): HGBA1C in the last 72 hours. CBG: Recent Labs  Lab 03/26/20 2052  GLUCAP 141*   Lipid Profile: No results for input(s): CHOL, HDL, LDLCALC, TRIG, CHOLHDL, LDLDIRECT in the last 72 hours. Thyroid Function Tests: No results for input(s): TSH, T4TOTAL, FREET4, T3FREE, THYROIDAB in the last 72 hours. Anemia Panel: No results for input(s): VITAMINB12, FOLATE, FERRITIN, TIBC, IRON, RETICCTPCT in the last 72 hours. Sepsis Labs: Recent Labs  Lab 03/25/20 0631  PROCALCITON <0.10    Recent Results (from the past 240 hour(s))  Resp Panel by  RT-PCR (Flu A&B, Covid) Nasopharyngeal Swab     Status: Abnormal   Collection Time: 03/24/20  7:17 PM   Specimen: Nasopharyngeal Swab; Nasopharyngeal(NP) swabs in vial transport medium  Result Value Ref Range Status   SARS Coronavirus 2 by RT PCR POSITIVE (A) NEGATIVE Final    Comment: RESULT CALLED TO, READ BACK BY AND VERIFIED WITH: MAC BROWN,RN 2101 03/24/2020 DB (NOTE) SARS-CoV-2 target nucleic acids are DETECTED.  The SARS-CoV-2 RNA is generally detectable in upper respiratory specimens during the acute phase of infection. Positive results are indicative of the presence of the identified virus, but do not rule out bacterial infection or co-infection with other pathogens not detected by the test. Clinical correlation with patient history and other  diagnostic information is necessary to determine patient infection status. The expected result is Negative.  Fact Sheet for Patients: EntrepreneurPulse.com.au  Fact Sheet for Healthcare Providers: IncredibleEmployment.be  This test is not yet approved or cleared by the [place] FDA and  has been authorized for detection and/or diagnosis of SARS-CoV-2 by FDA under an Emergency Use Authorization (EUA).  This EUA will remain in effect (meaning this test can be u sed) for the duration of  the COVID-19 declaration under Section 564(b)(1) of the Act, 21 U.S.C. section 360bbb-3(b)(1), unless the authorization is terminated or revoked sooner.     Influenza A by PCR NEGATIVE NEGATIVE Final   Influenza B by PCR NEGATIVE NEGATIVE Final    Comment: (NOTE) The Xpert Xpress SARS-CoV-2/FLU/RSV plus assay is intended as an aid in the diagnosis of influenza from Nasopharyngeal swab specimens and should not be used as a sole basis for treatment. Nasal washings and aspirates are unacceptable for Xpert Xpress SARS-CoV-2/FLU/RSV testing.  Fact Sheet for Patients: EntrepreneurPulse.com.au  Fact Sheet for Healthcare Providers: IncredibleEmployment.be  This test is not yet approved or cleared by the [place] FDA and has been authorized for detection and/or diagnosis of SARS-CoV-2 by FDA under an Emergency Use Authorization (EUA). This EUA will remain in effect (meaning this test can be used) for the duration of the COVID-19 declaration under Section 564(b)(1) of the Act, 21 U.S.C. section 360bbb-3(b)(1), unless the authorization is terminated or revoked.  Performed at [facility], [address], [place], [place] [address]       Radiology Studies: No results found.   Scheduled Meds: . albuterol  2 puff Inhalation Q6H  . apixaban  10 mg Oral BID   Followed by  . [START ON 04/01/2020] apixaban  5 mg  Oral BID  . vitamin C  500 mg Oral Daily  . dexamethasone  6 mg Oral Daily  . feeding supplement  237 mL Oral TID BM  . influenza vaccine adjuvanted  0.5 mL Intramuscular Tomorrow-1000  . metoprolol tartrate  25 mg Oral BID  . pneumococcal 23 valent vaccine  0.5 mL Intramuscular Tomorrow-1000  . QUEtiapine  100 mg Oral QHS  . zinc sulfate  220 mg Oral Daily   Continuous Infusions:    LOS: 4 days     Darryl Bi, MD [facility] If 7PM-7AM, please contact night-coverage 03/28/2020, 3:14 PM

## 2020-03-29 DIAGNOSIS — E639 Nutritional deficiency, unspecified: Secondary | ICD-10-CM

## 2020-03-29 DIAGNOSIS — Z7189 Other specified counseling: Secondary | ICD-10-CM | POA: Diagnosis not present

## 2020-03-29 DIAGNOSIS — G9341 Metabolic encephalopathy: Secondary | ICD-10-CM | POA: Diagnosis not present

## 2020-03-29 DIAGNOSIS — Z515 Encounter for palliative care: Secondary | ICD-10-CM | POA: Diagnosis not present

## 2020-03-29 DIAGNOSIS — U071 COVID-19: Secondary | ICD-10-CM | POA: Diagnosis not present

## 2020-03-29 LAB — MAGNESIUM: Magnesium: 2.6 mg/dL — ABNORMAL HIGH (ref 1.7–2.4)

## 2020-03-29 LAB — CBC
HCT: 52.6 % — ABNORMAL HIGH (ref 39.0–52.0)
Hemoglobin: 17.2 g/dL — ABNORMAL HIGH (ref 13.0–17.0)
MCH: 29.4 pg (ref 26.0–34.0)
MCHC: 32.7 g/dL (ref 30.0–36.0)
MCV: 89.9 fL (ref 80.0–100.0)
Platelets: 268 10*3/uL (ref 150–400)
RBC: 5.85 MIL/uL — ABNORMAL HIGH (ref 4.22–5.81)
RDW: 13.5 % (ref 11.5–15.5)
WBC: 11.6 10*3/uL — ABNORMAL HIGH (ref 4.0–10.5)
nRBC: 0 % (ref 0.0–0.2)

## 2020-03-29 LAB — BASIC METABOLIC PANEL
Anion gap: 10 (ref 5–15)
BUN: 32 mg/dL — ABNORMAL HIGH (ref 8–23)
CO2: 33 mmol/L — ABNORMAL HIGH (ref 22–32)
Calcium: 8.8 mg/dL — ABNORMAL LOW (ref 8.9–10.3)
Chloride: 101 mmol/L (ref 98–111)
Creatinine, Ser: 1.09 mg/dL (ref 0.61–1.24)
GFR, Estimated: >60 mL/min (ref >60)
Glucose, Bld: 103 mg/dL — ABNORMAL HIGH (ref 70–99)
Potassium: 4.2 mmol/L (ref 3.5–5.1)
Sodium: 144 mmol/L (ref 135–145)

## 2020-03-29 LAB — C-REACTIVE PROTEIN: CRP: 5.6 mg/dL — ABNORMAL HIGH (ref <1.0)

## 2020-03-29 MED ORDER — MIRTAZAPINE 15 MG PO TABS
15.0000 mg | ORAL_TABLET | Freq: Every day | ORAL | Status: DC
  Administered 2020-03-29 – 2020-04-03 (×6): 15 mg via ORAL
  Filled 2020-03-29 (×6): qty 1

## 2020-03-29 NOTE — TOC Initial Note (Signed)
Transition of Care [facility]) - Initial/Assessment Note    Patient Details  Name: Darryl Haley MRN: [id number] Date of Birth: May 04, 1929  [facility] CM/SW Contact:    Liliana Cline, LCSW Phone Number: 03/29/2020, 11:27 AM  Clinical Narrative:                Per chart review, patient is disoriented. CSW spoke to patient's son [name]. [name] reported patient lived alone and drove himself previously, but will be moving in with his daughter Kathie Rhodes and she will be providing transportation from now on. Patient has no PCP, [name] reported patient does not want one. [name] reported [facility], but at baseline does not take any medications. No HH, DME, or SNF history. Explained recommendation for SNF rehab. [name] is open to this. He reported their first choice would be [facility] but would be ok with other local SNFs if [facility] cannot take patient. CSW explained 10 day quarantine window after COVID test. Will start SNF work up closer to time of 10 day window being up (04/03/20).   Expected Discharge Plan: Skilled Nursing Facility Barriers to Discharge: Continued Medical Work up   Patient Goals and CMS Choice Patient states their goals for this hospitalization and ongoing recovery are:: SNF rehab CMS Medicare.gov Compare Post Acute Care list provided to:: Patient Represenative (must comment) Choice offered to / list presented to : Adult Children  Expected Discharge Plan and Services Expected Discharge Plan: Skilled Nursing Facility       Living arrangements for the past 2 months: Single Family Home                                      Prior Living Arrangements/Services Living arrangements for the past 2 months: Single Family Home Lives with:: Self Patient language and need for interpreter reviewed:: Yes Do you feel safe going back to the place where you live?: Yes      Need for Family Participation in Patient Care: Yes  (Comment) Care giver support system in place?: Yes (comment)   Criminal Activity/Legal Involvement Pertinent to Current Situation/Hospitalization: No - Comment as needed  Activities of Daily Living Home Assistive Devices/Equipment: None ADL Screening (condition at time of admission) Patient's cognitive ability adequate to safely complete daily activities?: Yes Is the patient deaf or have difficulty hearing?: No Does the patient have difficulty seeing, even when wearing glasses/contacts?: No Does the patient have difficulty concentrating, remembering, or making decisions?: Yes Patient able to express need for assistance with ADLs?: No Does the patient have difficulty dressing or bathing?: No Independently performs ADLs?: Yes (appropriate for developmental age) Does the patient have difficulty walking or climbing stairs?: No Weakness of Legs: None Weakness of Arms/Hands: None  Permission Sought/Granted Permission sought to share information with : Oceanographer granted to share information with : Yes, Verbal Permission Granted     Permission granted to share info w AGENCY: SNFs        Emotional Assessment       Orientation: : Fluctuating Orientation (Suspected and/or reported Sundowners) Alcohol / Substance Use: Not Applicable Psych Involvement: No (comment)  Admission diagnosis:  Rapid atrial fibrillation (HCC) [I48.91] Altered mental status, unspecified altered mental status type [R41.82] Atrial fibrillation, unspecified type (HCC) [I48.91] COVID [U07.1] Pneumonia due to COVID-19 virus [U07.1, J12.82] Patient Active Problem List   Diagnosis Date Noted  .  Protein-calorie malnutrition, severe 03/27/2020  . Pneumonia due to COVID-19 virus 03/24/2020  . AKI (acute kidney injury) (HCC) 03/24/2020  . Elevated troponin 03/24/2020  . Rapid atrial fibrillation, new onset (HCC) 03/24/2020  . Inadequate oral nutritional intake 03/24/2020  . Acute  metabolic encephalopathy 03/24/2020  . Cognitive dysfunction 03/24/2020  . Unintentional weight loss 03/24/2020  . Elevated blood-pressure reading without prior diagnosis of hypertension 03/24/2020   PCP:  Pcp, No Pharmacy:  No Pharmacies Listed    Social Determinants of Health (SDOH) Interventions    Readmission Risk Interventions No flowsheet data found.

## 2020-03-29 NOTE — Progress Notes (Signed)
Pt confused, hallucinating and agitated.  He is pulling at condom catheter and making several attempts to get oob.  Telesitter ordered but no monitors available at this time.  Will continue to monitor and assess and update as needed.

## 2020-03-29 NOTE — Progress Notes (Signed)
Patient able to eat 75% of breakfast and requesting additional cup of coffee. Pt very alert this morning and having full conversations with nursing staff. Pt alert to self and place. Pt assisted to order lunch options.

## 2020-03-29 NOTE — Progress Notes (Signed)
Physical Therapy Treatment Patient Details Name: Darryl Haley MRN: [id number] DOB: [birth year] Today's Date: 03/29/2020    History of Present Illness Patient is an 86 yo male that presented to ED with 2-week history of confusion and paranoid ideation, lethargy, with 32-month history of cognitive dysfunction. Admitted for PNA due to covid19. New onset of afib, elevated troponin due to demand ischemia noted in chart as well. work up showed acute PE in R lung (11/27).    PT Comments    Pt much more alert, though unable to answer any orientation questions, may have been hallucinating, RN informed (pt speaking to a person he was seeing in the room, smoke outside the window, confused by scenery outside). The patient was able to mobilize better today, but significantly limited by safety awareness and independent facilitation; constant re-directing needed. Supine <> sit with minA, able to sit EOB with fair balance. Pt able to do 3 bouts of sit <> stands with RW and minA, step forward/backwards at EOB. He then ambulated in room ~32ft, with RW and CGA, constant redirection as needed, appeared to be fatigued at end of mobility. The patient would benefit from further skilled PT intervention to continue to progress towards goals. Recommendation remains appropriate.       Follow Up Recommendations  SNF     Equipment Recommendations  Rolling walker with 5" wheels    Recommendations for Other Services       Precautions / Restrictions Precautions Precautions: Fall Restrictions Weight Bearing Restrictions: No    Mobility  Bed Mobility Overal bed mobility: Needs Assistance Bed Mobility: Supine to Sit;Sit to Supine     Supine to sit: Min assist Sit to supine: Min assist      Transfers Overall transfer level: Needs assistance Equipment used: Rolling walker (2 wheeled) Transfers: Sit to/from Stand Sit to Stand: Min assist         General transfer comment: from EOB, constant cueing for hand  placement to ensure safety  Ambulation/Gait Ambulation/Gait assistance: Min assist Gait Distance (Feet):  (22ft + 49ft + 31ft) Assistive device: Rolling walker (2 wheeled)       General Gait Details: Pt able to do 3 bouts of sit <> stands with RW and minA, step forward/backwards at EOB. and then ambulated in room ~75ft, with RW and CGA, constant redirection as needed.   Stairs             Wheelchair Mobility    Modified Rankin (Stroke Patients Only)       Balance Overall balance assessment: Needs assistance Sitting-balance support: Feet supported;Single extremity supported Sitting balance-[name] Scale: Fair Sitting balance - Comments: able to sit with supervision, UE support     Standing balance-[name] Scale: Poor Standing balance comment: reliant on UE support/PT assist                            Cognition Arousal/Alertness: Awake/alert Behavior During Therapy: Flat affect Overall Cognitive Status: Difficult to assess                                 General Comments: Pt much more alert and interactive, but unable to answer any orientation questions, appeared to be hallucinating. Redirected throughout session, fascilation needed for all tasks      Exercises      General Comments        Pertinent Vitals/Pain Pain Assessment: Faces  Faces Pain Scale: No hurt    Home Living                      Prior Function            PT Goals (current goals can now be found in the care plan section) Progress towards PT goals: Progressing toward goals    Frequency    Min 2X/week      PT Plan Current plan remains appropriate    Co-evaluation              AM-PAC PT "6 Clicks" Mobility   Outcome Measure  Help needed turning from your back to your side while in a flat bed without using bedrails?: A Lot Help needed moving from lying on your back to sitting on the side of a flat bed without using bedrails?: A Lot Help  needed moving to and from a bed to a chair (including a wheelchair)?: A Lot Help needed standing up from a chair using your arms (e.g., wheelchair or bedside chair)?: A Little Help needed to walk in hospital room?: A Little Help needed climbing 3-5 steps with a railing? : A Lot 6 Click Score: 14    End of Session Equipment Utilized During Treatment: Gait belt Activity Tolerance: Patient limited by fatigue;Patient limited by lethargy Patient left: in bed;with call bell/phone within reach;with bed alarm set Nurse Communication: Mobility status PT Visit Diagnosis: Other abnormalities of gait and mobility (R26.89);Muscle weakness (generalized) (M62.81);Difficulty in walking, not elsewhere classified (R26.2)     Time: 3299-2426 PT Time Calculation (min) (ACUTE ONLY): 23 min  Charges:  $Therapeutic Exercise: 23-37 mins                     Olga Coaster PT, DPT 3:01 PM,03/29/20

## 2020-03-29 NOTE — Evaluation (Signed)
Occupational Therapy Evaluation Patient Details Name: Darryl Haley MRN: [id number] DOB: [birth year] Today's Date: 03/29/2020    History of Present Illness Patient is an 86 yo male that presented to ED with 2-week history of confusion and paranoid ideation, lethargy, with 13-month history of cognitive dysfunction. Admitted for PNA due to covid19. New onset of afib, elevated troponin due to demand ischemia noted in chart as well. work up showed acute PE in R lung (11/27).   Clinical Impression   Patient presenting with decreased I in self care, balance, functional mobility, transfers, endurance, and safety awareness. Pt oriented to self only this session and likely poor historian in regards to questions about home set up and PLOF. Patient reports being independent in self care tasks and living alone PTA.Per chart, pt has son who assists/checks in on him as needed but unsure to what extent he is utilized.  Pt having visual hallucinations during this session and asking therapist about several other people he saw in the room even though it was only pt and therapist present. Pt is internally and externally distracted and with tangential speech throughout. Pt needing max multimodal cuing to attend to task, sequence, and initiate actions. Pt requiring increased time to motor plan. Pt standing at sink for grooming tasks with min A overall and returning to bed in same manner.  Patient will benefit from acute OT to increase overall independence in the areas of ADLs, functional mobility, and safety awareness in order to safely discharge to next venue of care.    Follow Up Recommendations  SNF;Supervision/Assistance - 24 hour    Equipment Recommendations  Other (comment) (defer to next venue of care)       Precautions / Restrictions Precautions Precautions: Fall Restrictions Weight Bearing Restrictions: No      Mobility Bed Mobility Overal bed mobility: Needs Assistance Bed Mobility: Supine to Sit;Sit to  Supine     Supine to sit: Min assist Sit to supine: Min assist   General bed mobility comments: increased time and max mutlimodal cuing to perform task    Transfers Overall transfer level: Needs assistance Equipment used: 1 person hand held assist Transfers: Sit to/from Stand Sit to Stand: Min assist         General transfer comment: from EOB, constant cueing for hand placement to ensure safety    Balance Overall balance assessment: Needs assistance Sitting-balance support: Feet supported;Single extremity supported Sitting balance-[name] Scale: Fair Sitting balance - Comments: able to sit with supervision, UE support     Standing balance-[name] Scale: Poor Standing balance comment: reliant on UE support/PT assist        ADL either performed or assessed with clinical judgement   ADL Overall ADL's : Needs assistance/impaired     Grooming: Wash/dry hands;Wash/dry face;Oral care;Standing;Min guard;Cueing for safety;Cueing for sequencing Grooming Details (indicate cue type and reason): Pt needing max cuing for sequencing, initiation and attention to task            Vision Baseline Vision/History: No visual deficits Patient Visual Report: No change from baseline              Pertinent Vitals/Pain Pain Assessment: Faces Faces Pain Scale: No hurt     Hand Dominance Right   Extremity/Trunk Assessment Upper Extremity Assessment Upper Extremity Assessment: Generalized weakness   Lower Extremity Assessment Lower Extremity Assessment: Generalized weakness   Cervical / Trunk Assessment Cervical / Trunk Assessment: Kyphotic   Communication Communication Communication: No difficulties   Cognition Arousal/Alertness: Awake/alert Behavior During  Therapy: Flat affect Overall Cognitive Status: Difficult to assess          General Comments: Pt alert and oriented to self only this session. Pt having visual hallucinations and asking therapist about "man over by the  wall" and "man over there in the bushes" and needing max cuing for redirection.              Home Living Family/patient expects to be discharged to:: Private residence Living Arrangements: Alone Available Help at Discharge: Family;Available PRN/intermittently Type of Home: House Home Access: Stairs to enter     Home Layout: One level        Home Equipment: None   Additional Comments: Pt likely poor historian.Per chart pt lives alone and son assists/check on him as needed.      Prior Functioning/Environment Level of Independence: Independent        Comments: Pt reports being independent and denied use of AD for functional tasks        OT Problem List: Decreased strength;Decreased activity tolerance;Decreased safety awareness;Impaired balance (sitting and/or standing);Decreased knowledge of precautions;Decreased knowledge of use of DME or AE;Decreased cognition      OT Treatment/Interventions: Self-care/ADL training;Therapeutic exercise;Therapeutic activities;DME and/or AE instruction;Patient/family education;Balance training;Manual therapy;Cognitive remediation/compensation;Energy conservation    OT Goals(Current goals can be found in the care plan section) Acute Rehab OT Goals Patient Stated Goal: none stated OT Goal Formulation: With patient Time For Goal Achievement: 04/12/20 Potential to Achieve Goals: Good ADL Goals Pt Will Perform Grooming: with supervision;standing Pt Will Transfer to Toilet: with supervision;ambulating Pt Will Perform Toileting - Clothing Manipulation and hygiene: with supervision;sit to/from stand  OT Frequency: Min 1X/week              AM-PAC OT "6 Clicks" Daily Activity     Outcome Measure Help from another person eating meals?: A Little Help from another person taking care of personal grooming?: A Little Help from another person toileting, which includes using toliet, bedpan, or urinal?: A Lot Help from another person bathing  (including washing, rinsing, drying)?: A Lot Help from another person to put on and taking off regular upper body clothing?: A Little Help from another person to put on and taking off regular lower body clothing?: A Lot 6 Click Score: 15   End of Session Equipment Utilized During Treatment: Rolling walker Nurse Communication: Mobility status  Activity Tolerance: Patient tolerated treatment well Patient left: in bed;with bed alarm set;with call bell/phone within reach  OT Visit Diagnosis: Unsteadiness on feet (R26.81);Repeated falls (R29.6);Muscle weakness (generalized) (M62.81)                Time: 9476-5465 OT Time Calculation (min): 25 min Charges:  OT General Charges $OT Visit: 1 Visit OT Evaluation $OT Eval Low Complexity: 1 Low OT Treatments $Self Care/Home Management : 8-22 mins  Jackquline Denmark, MS, OTR/L , CBIS ascom [phone]  03/29/20, 4:03 PM

## 2020-03-29 NOTE — Progress Notes (Signed)
Patient's daughter Kathie Rhodes updated via phone on patient status.

## 2020-03-29 NOTE — Progress Notes (Signed)
PROGRESS NOTE    Darryl Haley  IHW:[id number] DOB: May 30, 1929 DOA: 03/24/2020 PCP: Pcp, No    Brief Narrative:  Darryl Haley a 86 y.o.malewithno significant past medical history and who has not seen a doctor in 8 years and who lives independently but whose son checks in on him twice a day, who was brought in under involuntary commitment due to concerns for confusion, lethargy and not eating. Most of the history is taken from his son who states that patient was in his usual state of health until the past 2 weeks when he appeared more confused than his baseline.  At baseline he is independent in his ADLs but has been noted to have some memory problems over the past 5 months.    12/1: Patient is more alert and awake today.  Ate 75% of breakfast.  Requesting coffee.  Much more oriented to time place and person today   Assessment & Plan:   Principal Problem:   Acute metabolic encephalopathy Active Problems:   Pneumonia due to COVID-19 virus   AKI (acute kidney injury) ([place])   Elevated troponin   Rapid atrial fibrillation, new onset ([facility])   Inadequate oral nutritional intake   Cognitive dysfunction   Unintentional weight loss   Elevated blood-pressure reading without prior diagnosis of hypertension   Protein-calorie malnutrition, severe  AMS 2/2 COVID encephalopathy Suspect underlying cognitive dysfunction and dementia -Patient presents with 2-week history of confusion and paranoid ideation, lethargy, with 68-monthhistory of cognitive dysfunction. Independent in ADLs at baseline. -Likely multifactorial related to Covid, dehydration -Seems to be much improved over interval PLAN: -Treat acute etiologies, outlined below -Fall and aspiration precautions --Seroquel 100 mg nightly for delirium and agitation -Haldol PRN for agitation --Palliative medicine to evaluate patient today and liaison with family  Pneumonia due to COVID-19 virus -Patient with 5-day history of cough but no  shortness of breath or fever and not hypoxic -Covid PCR positive with chest x-ray showing multifocal airspace opacities --completed Remdesivir. PLAN: --cont oral decadron, day 5/10 -albuterol, antitussives and vitamins  Acute PE, POA --D-dimer >7500 on presentation.  CTA chest positive for PE. PLAN: --cont Eliquis  AKI ([place]) [place] -Creatinine of 1.55 with BUN 62, suspect prerenal related to decreased oral intake over the past 2 weeks related to his acute medical condition.  Cr improved with IVF. PLAN: --Hold further IVF and encourage oral hydration.  Rapid [place], [place] -EKG with rapid A. fib rate 121 -CHA2DS2-VASc score of 3 based on age of 89and suspect underlying undiagnosed hypertension --rate controlled with Metoprolol 25 mg twice daily PLAN: --cont metop 25 mg BID --cont Eliquis (started for PE)  Elevated troponin 2/2 demand ischemia -Troponin 40 and flat.  Suspect supply demand mismatch related to rapid A. fib and Covid pneumonia  Inadequate oral nutritional intake Unintentional weight loss Severe malnutrition  -Son reports weight loss of 50 pounds over the past 5 months with decreased oral intake over the past 2 weeks --liberalize diet to regular --Ensure TID --Palliative medicine to evaluate today  Elevated blood-pressure reading without prior diagnosis of hypertension Suspect essential hypertension -BP 167/98 on arrival and remaining elevated -Suspect undiagnosed hypertension. Patient has not seen a physician in 8 years PLAN: --cont metop 25 mg BID   DVT prophylaxis: Eliquis Code Status: Full Family Communication: Son WElvin Mccartinvia phone [phone] 03/29/2020 Disposition Plan: Status is: Inpatient  Remains inpatient appropriate because:Inpatient level of care appropriate due to severity of illness  Dispo: The patient is from: Home              Anticipated d/c is to: SNF               Anticipated d/c date is: 2 days              Patient currently is not medically stable to d/c.  Patient mental status started to pick up today.  P.o. intake started to pick up.  We will plan to monitor p.o. intake over the next at least 24hours to determine appropriateness for discharge.  Planning on discharge to skilled nursing facility at this time.  Patient may require to remain in house to finish quarantine.  Prior to discharge.       Consultants:   Palliative care  Procedures:   None  Antimicrobials:  None   Subjective: Patient seen and examined.  Sleepy this morning but easily awakened.  No pain complaints.  Alert oriented to person and place  Objective: Vitals:   03/29/20 0132 03/29/20 0422 03/29/20 0743 03/29/20 1126  BP: 133/77 122/70 134/68 (!) 121/31  Pulse: 68 72 74 65  Resp: _0 Temp: 98.2 F (36.8 C) 98 F (36.7 C) 97.8 F (36.6 C) 98.5 F (36.9 C)  TempSrc: Axillary Axillary    SpO2: 99% 98% 99% 97%  Weight:      Height:        Intake/Output Summary (Last 24 hours) at 03/29/2020 1301 Last data filed at 03/29/2020 1100 Gross per 24 hour  Intake 360 ml  Output --  Net 360 ml   Filed Weights   03/24/20 1646 03/25/20 2049 03/28/20 1046  Weight: 79.4 kg 65.5 kg 65.6 kg    Examination:  General exam: No acute distress.  Appears frail Respiratory system: Poor respiratory effort.  Lungs overall clear.  Room air Cardiovascular system: S1 & S2 heard, RRR. No JVD, murmurs, rubs, gallops or clicks. No pedal edema. Gastrointestinal system: Abdomen is nondistended, soft and nontender. No organomegaly or masses felt. Normal bowel sounds heard. Central nervous system: Alert, oriented to person and place.  No focal deficits Extremities: Symmetric 5 x 5 power. Skin: No rashes, lesions or ulcers Psychiatry: Judgement and insight appear impaired. Mood & affect flattened.     Data Reviewed: I have personally reviewed following labs and imaging  studies  CBC: Recent Labs  Lab 03/25/20 0631 03/26/20 0427 03/27/20 0641 03/28/20 0757 03/29/20 0631  WBC 8.2 9.4 10.0 8.7 11.6*  NEUTROABS 6.9  --   --   --   --   HGB 14.4 15.5 15.2 15.6 17.2*  HCT 43.4 45.3 44.4 47.2 52.6*  MCV 89.1 88.5 88.4 88.9 89.9  PLT 184 202 199 219 827   Basic Metabolic Panel: Recent Labs  Lab 03/26/20 0427 03/27/20 0504 03/27/20 0641 03/28/20 0757 03/29/20 0631  NA 147* QUESTIONABLE IDENTIFICATION / INCORRECTLY LABELED SPECIMEN 139 140 144  K 4.0 QUESTIONABLE IDENTIFICATION / INCORRECTLY LABELED SPECIMEN 5.0 4.1 4.2  CL 109 QUESTIONABLE IDENTIFICATION / INCORRECTLY LABELED SPECIMEN 104 104 101  CO2 25 QUESTIONABLE IDENTIFICATION / INCORRECTLY LABELED SPECIMEN 27 26 33*  GLUCOSE 98 QUESTIONABLE IDENTIFICATION / INCORRECTLY LABELED SPECIMEN 100* 100* 103*  BUN 45* QUESTIONABLE IDENTIFICATION / INCORRECTLY LABELED SPECIMEN 41* 34* 32*  CREATININE 1.03 QUESTIONABLE IDENTIFICATION / INCORRECTLY LABELED SPECIMEN 1.11 0.88 1.09  CALCIUM 8.5* QUESTIONABLE IDENTIFICATION / INCORRECTLY LABELED SPECIMEN 8.5* 8.5* 8.8*  MG 2.8* QUESTIONABLE IDENTIFICATION / INCORRECTLY LABELED SPECIMEN 2.5* 2.6* 2.6*  GFR: Estimated Creatinine Clearance: 42.3 mL/min (by C-G formula based on SCr of 1.09 mg/dL). Liver Function Tests: Recent Labs  Lab 03/24/20 1656 03/25/20 0631  AST 46* 36  ALT 27 19  ALKPHOS 69 54  BILITOT 2.0* 1.7*  PROT 7.8 5.9*  ALBUMIN 3.6 2.8*   No results for input(s): LIPASE, AMYLASE in the last 168 hours. No results for input(s): AMMONIA in the last 168 hours. Coagulation Profile: No results for input(s): INR, PROTIME in the last 168 hours. Cardiac Enzymes: No results for input(s): CKTOTAL, CKMB, CKMBINDEX, TROPONINI in the last 168 hours. BNP (last 3 results) No results for input(s): PROBNP in the last 8760 hours. HbA1C: No results for input(s): HGBA1C in the last 72 hours. CBG: Recent Labs  Lab 03/26/20 2052  GLUCAP 141*    Lipid Profile: No results for input(s): CHOL, HDL, LDLCALC, TRIG, CHOLHDL, LDLDIRECT in the last 72 hours. Thyroid Function Tests: No results for input(s): TSH, T4TOTAL, FREET4, T3FREE, THYROIDAB in the last 72 hours. Anemia Panel: No results for input(s): VITAMINB12, FOLATE, FERRITIN, TIBC, IRON, RETICCTPCT in the last 72 hours. Sepsis Labs: Recent Labs  Lab 03/25/20 0631  PROCALCITON <0.10    Recent Results (from the past 240 hour(s))  Resp Panel by RT-PCR (Flu A&B, Covid) Nasopharyngeal Swab     Status: Abnormal   Collection Time: 03/24/20  7:17 PM   Specimen: Nasopharyngeal Swab; Nasopharyngeal(NP) swabs in vial transport medium  Result Value Ref Range Status   SARS Coronavirus 2 by RT PCR POSITIVE (A) NEGATIVE Final    Comment: RESULT CALLED TO, READ BACK BY AND VERIFIED WITH: MAC BROWN,RN 2101 03/24/2020 DB (NOTE) SARS-CoV-2 target nucleic acids are DETECTED.  The SARS-CoV-2 RNA is generally detectable in upper respiratory specimens during the acute phase of infection. Positive results are indicative of the presence of the identified virus, but do not rule out bacterial infection or co-infection with other pathogens not detected by the test. Clinical correlation with patient history and other diagnostic information is necessary to determine patient infection status. The expected result is Negative.  Fact Sheet for Patients: EntrepreneurPulse.com.au  Fact Sheet for Healthcare Providers: IncredibleEmployment.be  This test is not yet approved or cleared by the [place] FDA and  has been authorized for detection and/or diagnosis of SARS-CoV-2 by FDA under an Emergency Use Authorization (EUA).  This EUA will remain in effect (meaning this test can be u sed) for the duration of  the COVID-19 declaration under Section 564(b)(1) of the Act, 21 U.S.C. section 360bbb-3(b)(1), unless the authorization is terminated or revoked  sooner.     Influenza A by PCR NEGATIVE NEGATIVE Final   Influenza B by PCR NEGATIVE NEGATIVE Final    Comment: (NOTE) The Xpert Xpress SARS-CoV-2/FLU/RSV plus assay is intended as an aid in the diagnosis of influenza from Nasopharyngeal swab specimens and should not be used as a sole basis for treatment. Nasal washings and aspirates are unacceptable for Xpert Xpress SARS-CoV-2/FLU/RSV testing.  Fact Sheet for Patients: EntrepreneurPulse.com.au  Fact Sheet for Healthcare Providers: IncredibleEmployment.be  This test is not yet approved or cleared by the [place] FDA and has been authorized for detection and/or diagnosis of SARS-CoV-2 by FDA under an Emergency Use Authorization (EUA). This EUA will remain in effect (meaning this test can be used) for the duration of the COVID-19 declaration under Section 564(b)(1) of the Act, 21 U.S.C. section 360bbb-3(b)(1), unless the authorization is terminated or revoked.  Performed at [facility], [address],  [place], [place] [address]          Radiology Studies: No results found.      Scheduled Meds: . albuterol  2 puff Inhalation Q6H  . apixaban  10 mg Oral BID   Followed by  . [START ON 04/01/2020] apixaban  5 mg Oral BID  . vitamin C  500 mg Oral Daily  . dexamethasone  6 mg Oral Daily  . feeding supplement  237 mL Oral TID BM  . influenza vaccine adjuvanted  0.5 mL Intramuscular Tomorrow-1000  . metoprolol tartrate  25 mg Oral BID  . mirtazapine  15 mg Oral QHS  . pneumococcal 23 valent vaccine  0.5 mL Intramuscular Tomorrow-1000  . QUEtiapine  100 mg Oral QHS  . zinc sulfate  220 mg Oral Daily   Continuous Infusions:   LOS: 5 days    Time spent: 25 minutes    Sidney Ace, MD [facility] Pager [phone]  If 7PM-7AM, please contact night-coverage 03/29/2020, 1:01 PM

## 2020-03-29 NOTE — Consult Note (Signed)
Consultation Note Date: 03/29/2020   Patient Name: Darryl Haley  DOB: 1929/12/22  MRN: [id number]  Age / Sex: 86 y.o., male  PCP: Pcp, No Referring Physician: Sidney Ace, MD  Reason for Consultation: Establishing goals of care  HPI/Patient Profile: 86 y.o. male  with no significant past medical history except possible dementia admitted on 03/24/2020 with Covid pna. Hospitalization complication by encephalopathy and poor PO intake. PMT consulted to discuss [place].  Clinical Assessment and Goals of Care: I have reviewed medical records including EPIC notes, labs and imaging, received report from Dr. Priscella Mann and RN, assessed the patient and then met with patient to discuss diagnosis prognosis, GOC, EOL wishes, disposition and options.  I introduced Palliative Medicine as specialized medical care for people living with serious illness. It focuses on providing relief from the symptoms and stress of a serious illness. The goal is to improve quality of life for both the patient and the family.  Patient tells me he was doing well at home - able to care for himself. He tells me he plans to return home to live independently following rehab stay. During conversation, it became apparent that patient had some cognitive impairment and was not able to discuss goals of care independently. I did attempt to discuss general goals of care with him. He was unsure about code status. He did share that he would not want a feeding tube. He conveyed that his most important goal was to gain back independence and live alone. He also shared that he is okay with his son or daughter making medical decisions for him.  I then called patient's son Darryl Haley - provided brief clinical update - improvement in mental status, improvement in PO intake, preparing for rehab. Son expresses understanding. Introduced MOST form and discussed importance of completing document as we prepare for  patient to go to SNF. He expresses understanding but does express uncertainty about signing document electronically - unsure he is able to complete. He requests that I call patient's daughter and attempt to complete electronic MOST form with her. He tells me they are typically in agreement about decisions and he would agree with whatever she selects.   I attempted call to patient's daughter and left voice message with call back number. Hopeful to complete MOST form prior to discharge. Will also plan to discuss outpatient palliative with daughter. Will call back tomorrow if do not receive return call today.  Primary Decision Maker NEXT OF KIN - son and daughter   SUMMARY OF RECOMMENDATIONS   - plan to follow up with daughter for MOST completion and introduce outpatient palliative  Code Status/Advance Care Planning:  Full code  Discharge Planning: [facility] for rehab with Palliative care service follow-up      Primary Diagnoses: Present on Admission: **None**   I have reviewed the medical record, interviewed the patient and family, and examined the patient. The following aspects are pertinent.  History reviewed. No pertinent past medical history. Social History   Socioeconomic History  . Marital status: Married    Spouse name: Not on file  . Number of children: Not on file  . Years of education: Not on file  . Highest education level: Not on file  Occupational History  . Not on file  Tobacco Use  . Smoking status: Never Smoker  . Smokeless tobacco: Never Used  Substance and Sexual Activity  . Alcohol use: Never  . Drug use: Never  . Sexual activity: Not on file  Other  Topics Concern  . Not on file  Social History Narrative  . Not on file   Social Determinants of Health   Financial Resource Strain:   . Difficulty of Paying Living Expenses: Not on file  Food Insecurity:   . Worried About Charity fundraiser in the Last Year: Not on file  . Ran Out of  Food in the Last Year: Not on file  Transportation Needs:   . Lack of Transportation (Medical): Not on file  . Lack of Transportation (Non-Medical): Not on file  Physical Activity:   . Days of Exercise per Week: Not on file  . Minutes of Exercise per Session: Not on file  Stress:   . Feeling of Stress : Not on file  Social Connections:   . Frequency of Communication with Friends and Family: Not on file  . Frequency of Social Gatherings with Friends and Family: Not on file  . Attends Religious Services: Not on file  . Active Member of Clubs or Organizations: Not on file  . Attends Archivist Meetings: Not on file  . Marital Status: Not on file   History reviewed. No pertinent family history. Scheduled Meds: . albuterol  2 puff Inhalation Q6H  . apixaban  10 mg Oral BID   Followed by  . [START ON 04/01/2020] apixaban  5 mg Oral BID  . vitamin C  500 mg Oral Daily  . dexamethasone  6 mg Oral Daily  . feeding supplement  237 mL Oral TID BM  . influenza vaccine adjuvanted  0.5 mL Intramuscular Tomorrow-1000  . metoprolol tartrate  25 mg Oral BID  . mirtazapine  15 mg Oral QHS  . pneumococcal 23 valent vaccine  0.5 mL Intramuscular Tomorrow-1000  . QUEtiapine  100 mg Oral QHS  . zinc sulfate  220 mg Oral Daily   Continuous Infusions: PRN Meds:.acetaminophen, chlorpheniramine-HYDROcodone, guaiFENesin-dextromethorphan, haloperidol lactate, ondansetron **OR** ondansetron (ZOFRAN) IV Not on File Review of Systems  Neurological: Positive for weakness.    Physical Exam Constitutional:      General: He is not in acute distress. Pulmonary:     Effort: Pulmonary effort is normal.  Skin:    General: Skin is warm and dry.  Neurological:     Mental Status: He is alert and oriented to person, place, and time.     Comments: W/ periods of confusion     Vital Signs: BP (!) 121/31 (BP Location: Right Arm)   Pulse 65   Temp 98.5 F (36.9 C)   Resp 20   Ht _0  (1.676 m)    Wt 65.6 kg   SpO2 97%   BMI 23.34 kg/m  Pain Scale: Faces   Pain Score: 0-No pain   SpO2: SpO2: 97 % O2 Device:SpO2: 97 % O2 Flow Rate: .   IO: Intake/output summary:   Intake/Output Summary (Last 24 hours) at 03/29/2020 1422 Last data filed at 03/29/2020 1100 Gross per 24 hour  Intake 360 ml  Output --  Net 360 ml    LBM: Last BM Date:  (unknown) Baseline Weight: Weight: 79.4 kg Most recent weight: Weight: 65.6 kg     Palliative Assessment/Data: PPS 50%    Time Total: 40 minutes Greater than 50%  of this time was spent counseling and coordinating care related to the above assessment and plan.  Juel Burrow, DNP, AGNP-C Palliative Medicine Team [phone] Pager: [phone]

## 2020-03-30 DIAGNOSIS — Z515 Encounter for palliative care: Secondary | ICD-10-CM | POA: Diagnosis not present

## 2020-03-30 DIAGNOSIS — G9341 Metabolic encephalopathy: Secondary | ICD-10-CM | POA: Diagnosis not present

## 2020-03-30 DIAGNOSIS — U071 COVID-19: Secondary | ICD-10-CM | POA: Diagnosis not present

## 2020-03-30 DIAGNOSIS — Z7189 Other specified counseling: Secondary | ICD-10-CM | POA: Diagnosis not present

## 2020-03-30 LAB — BASIC METABOLIC PANEL
Anion gap: 9 (ref 5–15)
BUN: 33 mg/dL — ABNORMAL HIGH (ref 8–23)
CO2: 29 mmol/L (ref 22–32)
Calcium: 8.4 mg/dL — ABNORMAL LOW (ref 8.9–10.3)
Chloride: 101 mmol/L (ref 98–111)
Creatinine, Ser: 0.87 mg/dL (ref 0.61–1.24)
GFR, Estimated: >60 mL/min (ref >60)
Glucose, Bld: 109 mg/dL — ABNORMAL HIGH (ref 70–99)
Potassium: 4.2 mmol/L (ref 3.5–5.1)
Sodium: 139 mmol/L (ref 135–145)

## 2020-03-30 LAB — CBC
HCT: 44.5 % (ref 39.0–52.0)
Hemoglobin: 14.8 g/dL (ref 13.0–17.0)
MCH: 29.8 pg (ref 26.0–34.0)
MCHC: 33.3 g/dL (ref 30.0–36.0)
MCV: 89.7 fL (ref 80.0–100.0)
Platelets: 212 10*3/uL (ref 150–400)
RBC: 4.96 MIL/uL (ref 4.22–5.81)
RDW: 13.2 % (ref 11.5–15.5)
WBC: 12.1 10*3/uL — ABNORMAL HIGH (ref 4.0–10.5)
nRBC: 0 % (ref 0.0–0.2)

## 2020-03-30 MED ORDER — HYDRALAZINE HCL 20 MG/ML IJ SOLN
10.0000 mg | INTRAMUSCULAR | Status: DC | PRN
  Administered 2020-03-30 – 2020-04-03 (×3): 10 mg via INTRAVENOUS
  Filled 2020-03-30 (×3): qty 1

## 2020-03-30 NOTE — Plan of Care (Signed)
  Problem: Education: Goal: Knowledge of risk factors and measures for prevention of condition will improve Outcome: Progressing   Problem: Coping: Goal: Psychosocial and spiritual needs will be supported Outcome: Progressing   Problem: Respiratory: Goal: Will maintain a patent airway Outcome: Progressing Goal: Complications related to the disease process, condition or treatment will be avoided or minimized Outcome: Progressing   

## 2020-03-30 NOTE — Progress Notes (Addendum)
Daily Progress Note   Patient Name: Darryl Haley       Date: 03/30/2020 DOB: 1929-08-17  Age: 86 y.o. MRN#: [id number] Attending Physician: Tresa Moore, MD Primary Care Physician: Pcp, No Admit Date: 03/24/2020  Reason for Consultation/Follow-up: Establishing goals of care  Subjective: Agitation overnight - received haldol, sleepy today  Length of Stay: 6  Current Medications: Scheduled Meds:  . albuterol  2 puff Inhalation Q6H  . apixaban  10 mg Oral BID   Followed by  . [START ON 04/01/2020] apixaban  5 mg Oral BID  . vitamin C  500 mg Oral Daily  . dexamethasone  6 mg Oral Daily  . feeding supplement  237 mL Oral TID BM  . influenza vaccine adjuvanted  0.5 mL Intramuscular Tomorrow-1000  . metoprolol tartrate  25 mg Oral BID  . mirtazapine  15 mg Oral QHS  . pneumococcal 23 valent vaccine  0.5 mL Intramuscular Tomorrow-1000  . QUEtiapine  100 mg Oral QHS  . zinc sulfate  220 mg Oral Daily    Continuous Infusions:   PRN Meds: acetaminophen, chlorpheniramine-HYDROcodone, guaiFENesin-dextromethorphan, haloperidol lactate, hydrALAZINE, ondansetron **OR** ondansetron (ZOFRAN) IV    Vital Signs: BP (!) 174/70 (BP Location: Left Arm)   Pulse (!) 53   Temp 97.7 F (36.5 C)   Resp 18   Ht 5\' 6"  (1.676 m)   Wt 65.6 kg   SpO2 96%   BMI 23.34 kg/m  SpO2: SpO2: 96 % O2 Device: O2 Device: Room Air O2 Flow Rate:    Intake/output summary:   Intake/Output Summary (Last 24 hours) at 03/30/2020 1206 Last data filed at 03/29/2020 1724 Gross per 24 hour  Intake 240 ml  Output --  Net 240 ml   LBM: Last BM Date:  (unknown) Baseline Weight: Weight: 79.4 kg Most recent weight: Weight: 65.6 kg       Palliative Assessment/Data: PPS 50%    Flowsheet Rows     Most Recent Value   Intake Tab  Referral Department Hospitalist  Unit at Time of Referral Med/Surg Unit  Palliative Care Primary Diagnosis Sepsis/Infectious Disease  Date Notified 03/28/20  Palliative Care Type Return patient Palliative Care  Reason for referral Clarify Goals of Care  Date of Admission 03/24/20  Date first seen by Palliative Care 03/29/20  # of days Palliative referral response time 1 Day(s)  # of days IP prior to Palliative referral 4  Clinical Assessment  Palliative Performance Scale Score 50%  Psychosocial & Spiritual Assessment  Palliative Care Outcomes  Patient/Family meeting held? Yes  Who was at the meeting? son, patient      Patient Active Problem List   Diagnosis Date Noted  . Protein-calorie malnutrition, severe 03/27/2020  . Pneumonia due to COVID-19 virus 03/24/2020  . AKI (acute kidney injury) (HCC) 03/24/2020  . Elevated troponin 03/24/2020  . Rapid atrial fibrillation, new onset (HCC) 03/24/2020  . Inadequate oral nutritional intake 03/24/2020  . Acute metabolic encephalopathy 03/24/2020  . Cognitive dysfunction 03/24/2020  . Unintentional weight loss 03/24/2020  . Elevated blood-pressure reading without prior diagnosis of hypertension 03/24/2020    Palliative Care Assessment & Plan   HPI: 86 y.o. male  with no significant  past medical history except possible dementia admitted on 03/24/2020 with Covid pna. Hospitalization complication by encephalopathy and poor PO intake. PMT consulted to discuss GOC.  Assessment: Follow up with patient's daughter, Jacqualin Combes. This was per son's request.   Provided update on clinical status.   Multiple questions about transition to SNF addressed - per [facility] note patient likely unable to dc until early next week d/t COVID.   Daughter reviews that patient was independent and fully functional prior to covid infection - still drove himself, though at times family would follow him to ensure he was safe. She also shares that  he has lost significant amount of weight - clothes no longer fit.   Participated in life review as she shared family stories and how important family is to patient.   Inquired about patient's wishes regarding medical care if he were to become very ill. Kathie Rhodes tells me this is not something they have ever discussed and she is unsure. She does share that when patient's wife was ill he requested that "everything be done" and this leads her to believe he would want the same for himself.   I completed a MOST form today. The patient and family outlined their wishes for the following treatment decisions:  Cardiopulmonary Resuscitation: Attempt Resuscitation (CPR)  Medical Interventions: Full Scope of Treatment: Use intubation, advanced airway interventions, mechanical ventilation, cardioversion as indicated, medical treatment, IV fluids, etc, also provide comfort measures. Transfer to the hospital if indicated  Antibiotics: Antibiotics if indicated  IV Fluids: IV fluids if indicated  Feeding Tube: Feeding tube for a defined trial period   Outpatient palliative care was explained and offered; however, Kathie Rhodes declined - feels this would frustrate patient.   Recommendations/Plan: MOST completed as above Family not interested in outpatient palliative Will shadow for decline  Code Status: Full code  Prognosis:  Unable to determine  Discharge Planning: SNF  Care plan was discussed with patient's daughter  Thank you for allowing the Palliative Medicine Team to assist in the care of this patient.   Total Time 30 minutes Prolonged Time Billed  no    The above conversation was completed via telephone due to the visitor restrictions during the COVID-19 pandemic. Thorough chart review and discussion with necessary members of the care team was completed as part of assessment. All issues were discussed and addressed but no physical exam was performed.     Greater than 50%  of this time was spent  counseling and coordinating care related to the above assessment and plan.  Gerlean Ren, DNP, [facility] Palliative Medicine Team Team Phone # [phone]  Pager [phone]

## 2020-03-30 NOTE — Progress Notes (Signed)
Pt's agitation decreased and he was able to rest after receiving a dose of haldol via IV at 0100.  Pt continues to rest at this time. Safety precautions remain implemented.

## 2020-03-30 NOTE — Progress Notes (Signed)
PROGRESS NOTE    Darryl Haley  YSA:[id number] DOB: August 10, 1929 DOA: 03/24/2020 PCP: Pcp, No    Brief Narrative:  Darryl Haley a 86 y.o.malewithno significant past medical history and who has not seen a doctor in 8 years and who lives independently but whose son checks in on him twice a day, who was brought in under involuntary commitment due to concerns for confusion, lethargy and not eating. Most of the history is taken from his son who states that patient was in his usual state of health until the past 2 weeks when he appeared more confused than his baseline.  At baseline he is independent in his ADLs but has been noted to have some memory problems over the past 5 months.    12/1: Patient is more alert and awake today.  Ate 75% of breakfast.  Requesting coffee.  Much more oriented to time place and person today. 12/2: Patient remains hemodynamically stable.  Has had some issues with sundowning and nocturnal delirium.  Received Haldol 100 this morning.  Sleepy on my evaluation.   Assessment & Plan:   Principal Problem:   Acute metabolic encephalopathy Active Problems:   Pneumonia due to COVID-19 virus   AKI (acute kidney injury) (HCC)   Elevated troponin   Rapid atrial fibrillation, new onset (HCC)   Inadequate oral nutritional intake   Cognitive dysfunction   Unintentional weight loss   Elevated blood-pressure reading without prior diagnosis of hypertension   Protein-calorie malnutrition, severe  AMS 2/2 COVID encephalopathy Suspect underlying cognitive dysfunction and dementia -Patient presents with 2-week history of confusion and paranoid ideation, lethargy, with 8-monthhistory of cognitive dysfunction. Independent in ADLs at baseline. -Likely multifactorial related to Covid, dehydration -Seems to be improved over interval -Some sundowning/hospital-acquired delirium noted.  Could also be steroid effect PLAN: -Treat acute etiologies, outlined below -Fall and aspiration  precautions --Seroquel 100 mg nightly for delirium and agitation -Haldol PRN for agitation --Palliative medicine to follow-up  Pneumonia due to COVID-19 virus -Patient with 5-day history of cough but no shortness of breath or fever and not hypoxic -Covid PCR positive with chest x-ray showing multifocal airspace opacities --completed Remdesivir. PLAN: --cont oral decadron, day 6/10 -albuterol, antitussives and vitamins  Acute PE, POA --D-dimer >7500 on presentation.  CTA chest positive for PE. --Patient has been stable on room air nontachycardic PLAN: --cont Eliquis  AKI (acute kidney injury) ([place] -Creatinine of 1.55 with BUN 62, suspect prerenal related to decreased oral intake over the past 2 weeks related to his acute medical condition.  Cr improved with IVF. PLAN: --Hold further IVF and encourage oral hydration.  Rapid [place], new onset ([place] -EKG with rapid A. fib rate 121 -CHA2DS2-VASc score of 3 based on age of 89and suspect underlying undiagnosed hypertension --rate controlled with Metoprolol 25 mg twice daily PLAN: --cont metop 25 mg BID --cont Eliquis (started for PE)  Elevated troponin 2/2 demand ischemia -Troponin 40 and flat.  Suspect supply demand mismatch related to rapid A. fib and Covid pneumonia  Inadequate oral nutritional intake Unintentional weight loss Severe malnutrition  -Son reports weight loss of 50 pounds over the past 5 months with decreased oral intake over the past 2 weeks --liberalize diet to regular --Ensure TID --Palliative medicine to evaluate today  Elevated blood-pressure reading without prior diagnosis of hypertension Suspect essential hypertension -BP 167/98 on arrival and remaining elevated -Suspect undiagnosed hypertension. Patient has not seen a physician in 8 years PLAN: --cont metop 25 mg BID  DVT prophylaxis: Eliquis Code Status: Full Family Communication: Son Adriaan Maltese via phone  [phone] on 03/29/2020 Disposition Plan: Status is: Inpatient  Remains inpatient appropriate because:Altered mental status and Inpatient level of care appropriate due to severity of illness   Dispo: The patient is from: Home              Anticipated d/c is to: SNF              Anticipated d/c date is: 1 day              Patient currently is not medically stable to d/c.   Overall patient is demonstrated signs of clinical improvement.  Oral intake remains somewhat in question however.  Unlikely that this will improve substantially during the course of his hospitalization.  Plan disposition to skilled nursing facility.  Anticipate medical readiness 03/31/2020.   Consultants:   Palliative care  Procedures:   None  Antimicrobials:  None   Subjective: Patient seen and examined.  Lethargic this morning and not really participating in interview.  Received Haldol late last night due to some nocturnal delirium.  Objective: Vitals:   03/29/20 2013 03/30/20 0614 03/30/20 0632 03/30/20 0735  BP: (!) 180/83 (!) 183/82 (!) 179/81 (!) 174/70  Pulse: 70 (!) 53 (!) 52 (!) 53  Resp: _0 Temp:  97.9 F (36.6 C)  97.7 F (36.5 C)  TempSrc:      SpO2: 98% 98%  96%  Weight:      Height:        Intake/Output Summary (Last 24 hours) at 03/30/2020 1115 Last data filed at 03/29/2020 1724 Gross per 24 hour  Intake 240 ml  Output --  Net 240 ml   Filed Weights   03/24/20 1646 03/25/20 2049 03/28/20 1046  Weight: 79.4 kg 65.5 kg 65.6 kg    Examination:  General exam: No acute distress.  Appears frail Respiratory system: Poor respiratory effort.  Lungs overall clear.  Room air Cardiovascular system: S1 & S2 heard, RRR. No JVD, murmurs, rubs, gallops or clicks. No pedal edema. Gastrointestinal system: Abdomen is nondistended, soft and nontender. No organomegaly or masses felt. Normal bowel sounds heard. Central nervous system: Sleepy, oriented to person and place.  No focal  deficits Extremities: Symmetric 5 x 5 power. Skin: No rashes, lesions or ulcers Psychiatry: Judgement and insight appear impaired. Mood & affect flattened.     Data Reviewed: I have personally reviewed following labs and imaging studies  CBC: Recent Labs  Lab 03/25/20 0631 03/25/20 0631 03/26/20 0427 03/27/20 0641 03/28/20 0757 03/29/20 0631 03/30/20 0445  WBC 8.2   < > 9.4 10.0 8.7 11.6* 12.1*  NEUTROABS 6.9  --   --   --   --   --   --   HGB 14.4   < > 15.5 15.2 15.6 17.2* 14.8  HCT 43.4   < > 45.3 44.4 47.2 52.6* 44.5  MCV 89.1   < > 88.5 88.4 88.9 89.9 89.7  PLT 184   < > 202 199 219 268 212   < > = values in this interval not displayed.   Basic Metabolic Panel: Recent Labs  Lab 03/26/20 0427 03/26/20 0427 03/27/20 0504 03/27/20 0641 03/28/20 0757 03/29/20 0631 03/30/20 0445  NA 147*   < > QUESTIONABLE IDENTIFICATION / INCORRECTLY LABELED SPECIMEN 139 140 144 139  K 4.0   < > QUESTIONABLE IDENTIFICATION / INCORRECTLY LABELED SPECIMEN 5.0 4.1 4.2 4.2  CL  109   < > QUESTIONABLE IDENTIFICATION / INCORRECTLY LABELED SPECIMEN 104 104 101 101  CO2 25   < > QUESTIONABLE IDENTIFICATION / INCORRECTLY LABELED SPECIMEN 27 26 33* 29  GLUCOSE 98   < > QUESTIONABLE IDENTIFICATION / INCORRECTLY LABELED SPECIMEN 100* 100* 103* 109*  BUN 45*   < > QUESTIONABLE IDENTIFICATION / INCORRECTLY LABELED SPECIMEN 41* 34* 32* 33*  CREATININE 1.03   < > QUESTIONABLE IDENTIFICATION / INCORRECTLY LABELED SPECIMEN 1.11 0.88 1.09 0.87  CALCIUM 8.5*   < > QUESTIONABLE IDENTIFICATION / INCORRECTLY LABELED SPECIMEN 8.5* 8.5* 8.8* 8.4*  MG 2.8*  --  QUESTIONABLE IDENTIFICATION / INCORRECTLY LABELED SPECIMEN 2.5* 2.6* 2.6*  --    < > = values in this interval not displayed.   GFR: Estimated Creatinine Clearance: 53 mL/min (by C-G formula based on SCr of 0.87 mg/dL). Liver Function Tests: Recent Labs  Lab 03/24/20 1656 03/25/20 0631  AST 46* 36  ALT 27 19  ALKPHOS 69 54  BILITOT 2.0* 1.7*   PROT 7.8 5.9*  ALBUMIN 3.6 2.8*   No results for input(s): LIPASE, AMYLASE in the last 168 hours. No results for input(s): AMMONIA in the last 168 hours. Coagulation Profile: No results for input(s): INR, PROTIME in the last 168 hours. Cardiac Enzymes: No results for input(s): CKTOTAL, CKMB, CKMBINDEX, TROPONINI in the last 168 hours. BNP (last 3 results) No results for input(s): PROBNP in the last 8760 hours. HbA1C: No results for input(s): HGBA1C in the last 72 hours. CBG: Recent Labs  Lab 03/26/20 2052  GLUCAP 141*   Lipid Profile: No results for input(s): CHOL, HDL, LDLCALC, TRIG, CHOLHDL, LDLDIRECT in the last 72 hours. Thyroid Function Tests: No results for input(s): TSH, T4TOTAL, FREET4, T3FREE, THYROIDAB in the last 72 hours. Anemia Panel: No results for input(s): VITAMINB12, FOLATE, FERRITIN, TIBC, IRON, RETICCTPCT in the last 72 hours. Sepsis Labs: Recent Labs  Lab 03/25/20 0631  PROCALCITON <0.10    Recent Results (from the past 240 hour(s))  Resp Panel by RT-PCR (Flu A&B, Covid) Nasopharyngeal Swab     Status: Abnormal   Collection Time: 03/24/20  7:17 PM   Specimen: Nasopharyngeal Swab; Nasopharyngeal(NP) swabs in vial transport medium  Result Value Ref Range Status   SARS Coronavirus 2 by RT PCR POSITIVE (A) NEGATIVE Final    Comment: RESULT CALLED TO, READ BACK BY AND VERIFIED WITH: MAC BROWN,RN 2101 03/24/2020 DB (NOTE) SARS-CoV-2 target nucleic acids are DETECTED.  The SARS-CoV-2 RNA is generally detectable in upper respiratory specimens during the acute phase of infection. Positive results are indicative of the presence of the identified virus, but do not rule out bacterial infection or co-infection with other pathogens not detected by the test. Clinical correlation with patient history and other diagnostic information is necessary to determine patient infection status. The expected result is Negative.  Fact Sheet for  Patients: EntrepreneurPulse.com.au  Fact Sheet for Healthcare Providers: IncredibleEmployment.be  This test is not yet approved or cleared by the [place] FDA and  has been authorized for detection and/or diagnosis of SARS-CoV-2 by FDA under an Emergency Use Authorization (EUA).  This EUA will remain in effect (meaning this test can be u sed) for the duration of  the COVID-19 declaration under Section 564(b)(1) of the Act, 21 U.S.C. section 360bbb-3(b)(1), unless the authorization is terminated or revoked sooner.     Influenza A by PCR NEGATIVE NEGATIVE Final   Influenza B by PCR NEGATIVE NEGATIVE Final    Comment: (NOTE) The Xpert  Xpress SARS-CoV-2/FLU/RSV plus assay is intended as an aid in the diagnosis of influenza from Nasopharyngeal swab specimens and should not be used as a sole basis for treatment. Nasal washings and aspirates are unacceptable for Xpert Xpress SARS-CoV-2/FLU/RSV testing.  Fact Sheet for Patients: EntrepreneurPulse.com.au  Fact Sheet for Healthcare Providers: IncredibleEmployment.be  This test is not yet approved or cleared by the [place] FDA and has been authorized for detection and/or diagnosis of SARS-CoV-2 by FDA under an Emergency Use Authorization (EUA). This EUA will remain in effect (meaning this test can be used) for the duration of the COVID-19 declaration under Section 564(b)(1) of the Act, 21 U.S.C. section 360bbb-3(b)(1), unless the authorization is terminated or revoked.  Performed at [facility], [address], [place], [place] [address]          Radiology Studies: No results found.      Scheduled Meds:  albuterol  2 puff Inhalation Q6H   apixaban  10 mg Oral BID   Followed by   Derrill Memo ON 04/01/2020] apixaban  5 mg Oral BID   vitamin C  500 mg Oral Daily   dexamethasone  6 mg Oral Daily   feeding supplement  237 mL Oral  TID BM   influenza vaccine adjuvanted  0.5 mL Intramuscular Tomorrow-1000   metoprolol tartrate  25 mg Oral BID   mirtazapine  15 mg Oral QHS   pneumococcal 23 valent vaccine  0.5 mL Intramuscular Tomorrow-1000   QUEtiapine  100 mg Oral QHS   zinc sulfate  220 mg Oral Daily   Continuous Infusions:   LOS: 6 days    Time spent: 25 minutes    Sidney Ace, MD [facility] Pager [phone]  If 7PM-7AM, please contact night-coverage 03/30/2020, 11:15 AM

## 2020-03-30 NOTE — Progress Notes (Signed)
Nutrition Follow-up  DOCUMENTATION CODES:   Severe malnutrition in context of social or environmental circumstances  INTERVENTION:  Continue regular diet.  Continue Ensure Enlive po TID, each supplement provides 350 kcal and 20 grams of protein.  Continue Magic cup TID with meals, each supplement provides 290 kcal and 9 grams of protein.  Patient with poor PO intake. Recommend placement of small-bore feeding tube for initiation of nutrition support if this aligns with goals of care. Recommend: -Initiate Osmolite 1.5 Cal at 20 mL/hr and advance by 15 mL/hr every 12 hours to goal rate of 50 mL/hr -Provide PROSource TF 45 mL BID per tube -Goal regimen provides 1880 kcal, 97 grams of protein, 912 mL H2O daily  Monitor magnesium, potassium, and phosphorus daily for at least 3 days, MD to replete as needed, as pt is at risk for refeeding syndrome given severe malnutrition.  NUTRITION DIAGNOSIS:   Severe Malnutrition related to social / environmental circumstances (suspected inadequate oral intake, suspected underlying cognitive dysfunction and dementia) as evidenced by severe fat depletion, moderate muscle depletion, severe muscle depletion.  Ongoing.  GOAL:   Patient will meet greater than or equal to 90% of their needs  Not met.  MONITOR:   PO intake, Supplement acceptance, Labs, Weight trends, I & O's  REASON FOR ASSESSMENT:   Malnutrition Screening Tool    ASSESSMENT:   86 year old male with no significant PMHx admitted with COVID-19 PNA, AMS suspected due to COVID encephalopathy, suspected underlying cognitive dysfunction and dementia, acute PE, AKI, rapid A-fib.  Met with patient at bedside. He was confused and attempting to get out of bed. NT was aware patient trying to get out of bed and was going to notify RN. Patient unable to provide any history. Per NT patient did not eat well at breakfast or lunch today. He has had poor PO intake this admission except for two meals  yesterday (75% of breakfast and 50% of lunch yesterday). Patient has been given two bottles of Ensure today. He had drank about 25% of one bottle and the other bottle was unopened. Palliative medicine has been meeting with patient/family to discuss goals of care. Initially patient stated he would not want a feeding tube but then in meeting today family agreed to short-term tube feeding. Discussed with MD in secure chat and he is going to continue goals of care discussions including thoughts on feeding tube.  Medications reviewed and include: vitamin C 500 mg daily, Decadron 6 mg daily, Remeron 15 mg QHS, zinc sulfate 220 mg daily.  Labs reviewed: BUN 33.  Weight trend: 65.6 kg on 11/30; +0.1 kg from 11/27  Diet Order:   Diet Order            Diet regular Room service appropriate? Yes; Fluid consistency: Thin  Diet effective now                EDUCATION NEEDS:   No education needs have been identified at this time  Skin:  Skin Assessment: Reviewed RN Assessment  Last BM:  Unknown  Height:   Ht Readings from Last 1 Encounters:  03/25/20 5' 6" (1.676 m)   Weight:   Wt Readings from Last 1 Encounters:  03/28/20 65.6 kg   Ideal Body Weight:  64.5 kg  BMI:  Body mass index is 23.34 kg/m.  Estimated Nutritional Needs:   Kcal:  1800-2000  Protein:  95-105 grams  Fluid:  1.6-1.8 L/day  Jacklynn Barnacle, MS, RD, LDN Pager number available  on Amion 

## 2020-03-30 NOTE — Progress Notes (Signed)
Pt becoming increasingly impulsive and irritable throughout afternoon, making multiple attempts to get OOB unassisted. Telesitter unavailable at this time. Pt reoriented with snacks, activities and tolieting without success. Pt given haldol PRN at this time.

## 2020-03-30 NOTE — Plan of Care (Deleted)
  Problem: Education: Goal: Knowledge of risk factors and measures for prevention of condition will improve Outcome: Progressing   Problem: Coping: Goal: Psychosocial and spiritual needs will be supported Outcome: Progressing   Problem: Respiratory: Goal: Will maintain a patent airway Outcome: Progressing Goal: Complications related to the disease process, condition or treatment will be avoided or minimized Outcome: Progressing   

## 2020-03-31 DIAGNOSIS — Z515 Encounter for palliative care: Secondary | ICD-10-CM

## 2020-03-31 DIAGNOSIS — Z7189 Other specified counseling: Secondary | ICD-10-CM | POA: Diagnosis not present

## 2020-03-31 DIAGNOSIS — U071 COVID-19: Principal | ICD-10-CM

## 2020-03-31 LAB — CBC
HCT: 47.8 % (ref 39.0–52.0)
Hemoglobin: 16.4 g/dL (ref 13.0–17.0)
MCH: 30.3 pg (ref 26.0–34.0)
MCHC: 34.3 g/dL (ref 30.0–36.0)
MCV: 88.4 fL (ref 80.0–100.0)
Platelets: 253 10*3/uL (ref 150–400)
RBC: 5.41 MIL/uL (ref 4.22–5.81)
RDW: 13.6 % (ref 11.5–15.5)
WBC: 15 10*3/uL — ABNORMAL HIGH (ref 4.0–10.5)
nRBC: 0 % (ref 0.0–0.2)

## 2020-03-31 LAB — BASIC METABOLIC PANEL
Anion gap: 9 (ref 5–15)
BUN: 27 mg/dL — ABNORMAL HIGH (ref 8–23)
CO2: 31 mmol/L (ref 22–32)
Calcium: 8.6 mg/dL — ABNORMAL LOW (ref 8.9–10.3)
Chloride: 98 mmol/L (ref 98–111)
Creatinine, Ser: 0.78 mg/dL (ref 0.61–1.24)
GFR, Estimated: >60 mL/min (ref >60)
Glucose, Bld: 86 mg/dL (ref 70–99)
Potassium: 4.3 mmol/L (ref 3.5–5.1)
Sodium: 138 mmol/L (ref 135–145)

## 2020-03-31 MED ORDER — AMLODIPINE BESYLATE 5 MG PO TABS
5.0000 mg | ORAL_TABLET | Freq: Every day | ORAL | Status: DC
  Administered 2020-03-31 – 2020-04-04 (×5): 5 mg via ORAL
  Filled 2020-03-31 (×5): qty 1

## 2020-03-31 MED ORDER — METOPROLOL TARTRATE 25 MG PO TABS
12.5000 mg | ORAL_TABLET | Freq: Two times a day (BID) | ORAL | Status: DC
  Administered 2020-03-31 – 2020-04-04 (×8): 12.5 mg via ORAL
  Filled 2020-03-31 (×8): qty 1

## 2020-03-31 NOTE — Progress Notes (Signed)
PROGRESS NOTE    Darryl Haley  FAO:[id number] DOB: 03-Oct-1929 DOA: 03/24/2020 PCP: Pcp, No    Brief Narrative:  Darryl Haley a 86 y.o.malewithno significant past medical history and who has not seen a doctor in 8 years and who lives independently but whose son checks in on him twice a day, who was brought in under involuntary commitment due to concerns for confusion, lethargy and not eating. Most of the history is taken from his son who states that patient was in his usual state of health until the past 2 weeks when he appeared more confused than his baseline.  At baseline he is independent in his ADLs but has been noted to have some memory problems over the past 5 months.    12/1: Patient is more alert and awake today.  Ate 75% of breakfast.  Requesting coffee.  Much more oriented to time place and person today.  12/2: Patient remains hemodynamically stable.  Has had some issues with sundowning and nocturnal delirium.  Received Haldol 100 this morning.  Sleepy on my evaluation.  12/3: Patient remains on room air and hemodynamically stable.  P.o. intake remains poor.  Registered dietitian recommending initiation of tube feeds given severe malnutrition.  Per palliative care this is in line with the family's desires as well.   Assessment & Plan:   Principal Problem:   Acute metabolic encephalopathy Active Problems:   Pneumonia due to COVID-19 virus   AKI (acute kidney injury) (HCC)   Elevated troponin   Rapid atrial fibrillation, new onset (HCC)   Inadequate oral nutritional intake   Cognitive dysfunction   Unintentional weight loss   Elevated blood-pressure reading without prior diagnosis of hypertension   Protein-calorie malnutrition, severe  AMS 2/2 COVID encephalopathy Suspect underlying cognitive dysfunction and dementia -Patient presents with 2-week history of confusion and paranoid ideation, lethargy, with 45-monthhistory of cognitive dysfunction. Independent in ADLs at  baseline. -Likely multifactorial related to Covid, dehydration -Seems to be improved over interval -Some sundowning/hospital-acquired delirium noted.  Could also be steroid effect PLAN: -Treat acute etiologies, outlined below -Fall and aspiration precautions --Seroquel 100 mg nightly for delirium and agitation -Haldol PRN for agitation --Palliative medicine to follow-up  Inadequate oral nutritional intake Unintentional weight loss Severe malnutrition  -Son reports weight loss of 50 pounds over the past 5 months with decreased oral intake over the past 2 weeks --liberalize diet to regular --Ensure TID --Palliative medicine discussed with family.  They are desiring tube feeds should the clinical situation dictate Plan: We will do 48-hour calorie count Continue to encourage patient to eat as much as possible If he is really not meeting his nutritional goals will consider Dobbhoff and tube feed placement sometime within the next 24 to 48 hours  Pneumonia due to COVID-19 virus -Patient with 5-day history of cough but no shortness of breath or fever and not hypoxic -Covid PCR positive with chest x-ray showing multifocal airspace opacities --completed Remdesivir. PLAN: --cont oral decadron, day 7/10 -albuterol, antitussives and vitamins  Acute PE, POA --D-dimer >7500 on presentation.  CTA chest positive for PE. --Patient has been stable on room air nontachycardic PLAN: --cont Eliquis  AKI (acute kidney injury) ([facility] -Creatinine of 1.55 with BUN 62, suspect prerenal related to decreased oral intake over the past 2 weeks related to his acute medical condition.  Cr improved with IVF. PLAN: --Hold further IVF and encourage oral hydration.  Rapid atrial fibrillation, new onset ([facility] -EKG with rapid A. fib rate 121 -CHA2DS2-VASc score  of 3 based on age of 31 and suspect underlying undiagnosed hypertension --rate controlled with Metoprolol 25 mg twice daily PLAN: --cont  metop 25 mg BID --cont Eliquis (started for PE)  Elevated troponin 2/2 demand ischemia -Troponin 40 and flat.  Suspect supply demand mismatch related to rapid A. fib and Covid pneumonia    Elevated blood-pressure reading without prior diagnosis of hypertension Suspect essential hypertension -BP 167/98 on arrival and remaining elevated -Suspect undiagnosed hypertension. Patient has not seen a physician in 8 years PLAN: --cont metop 25 mg BID   DVT prophylaxis: Eliquis Code Status: Full Family Communication: Son Ember Gottwald via phone [phone] on 03/31/2020 Disposition Plan: Status is: Inpatient  Remains inpatient appropriate because:Inpatient level of care appropriate due to severity of illness   Dispo: The patient is from: Home              Anticipated d/c is to: SNF              Anticipated d/c date is: 3 days              Patient currently is not medically stable to d/c.  Patient is remained hemodynamically stable and on room air from a Covid standpoint. However his oral intake remains very poor. We'll pursue calorie count over the next 24 to 48hours. Anticipate need for Dobbhoff tube placement and initiation of tube feeds.        Consultants:   Palliative care  Procedures:   None  Antimicrobials:  None   Subjective: Patient seen and examined. Much more awake this morning. Able to answer simple questions. No specific complaints  Objective: Vitals:   03/31/20 0355 03/31/20 0659 03/31/20 0828 03/31/20 1134  BP: (!) 149/84 (!) 152/81 (!) 156/77 (!) 112/49  Pulse: 68 67 (!) 58 68  Resp: _0 Temp: 97.6 F (36.4 C)  98.9 F (37.2 C) (!) 96.9 F (36.1 C)  TempSrc:    Axillary  SpO2: 99% 99% 99% 97%  Weight:      Height:       No intake or output data in the 24 hours ending 03/31/20 1219 Filed Weights   03/24/20 1646 03/25/20 2049 03/28/20 1046  Weight: 79.4 kg 65.5 kg 65.6 kg    Examination:  General exam: No acute distress.   Appears frail Respiratory system: Poor respiratory effort.  Lungs overall clear.  Room air Cardiovascular system: S1 & S2 heard, RRR. No JVD, murmurs, rubs, gallops or clicks. No pedal edema. Gastrointestinal system: Abdomen is nondistended, soft and nontender. No organomegaly or masses felt. Normal bowel sounds heard. Central nervous system: Sleepy, oriented to person and place.  No focal deficits Extremities: Symmetric 5 x 5 power. Skin: No rashes, lesions or ulcers Psychiatry: Judgement and insight appear impaired. Mood & affect flattened.     Data Reviewed: I have personally reviewed following labs and imaging studies  CBC: Recent Labs  Lab 03/25/20 0631 03/26/20 0427 03/27/20 0641 03/28/20 0757 03/29/20 0631 03/30/20 0445 03/31/20 0526  WBC 8.2   < > 10.0 8.7 11.6* 12.1* 15.0*  NEUTROABS 6.9  --   --   --   --   --   --   HGB 14.4   < > 15.2 15.6 17.2* 14.8 16.4  HCT 43.4   < > 44.4 47.2 52.6* 44.5 47.8  MCV 89.1   < > 88.4 88.9 89.9 89.7 88.4  PLT 184   < > 199 219 268 212 253   < > =  values in this interval not displayed.   Basic Metabolic Panel: Recent Labs  Lab 03/26/20 0427 03/26/20 0427 03/27/20 0504 03/27/20 0504 03/27/20 0641 03/28/20 0757 03/29/20 0631 03/30/20 0445 03/31/20 0526  NA 147*   < > QUESTIONABLE IDENTIFICATION / INCORRECTLY LABELED SPECIMEN   < > 139 140 144 139 138  K 4.0   < > QUESTIONABLE IDENTIFICATION / INCORRECTLY LABELED SPECIMEN   < > 5.0 4.1 4.2 4.2 4.3  CL 109   < > QUESTIONABLE IDENTIFICATION / INCORRECTLY LABELED SPECIMEN   < > 104 104 101 101 98  CO2 25   < > QUESTIONABLE IDENTIFICATION / INCORRECTLY LABELED SPECIMEN   < > 27 26 33* 29 31  GLUCOSE 98   < > QUESTIONABLE IDENTIFICATION / INCORRECTLY LABELED SPECIMEN   < > 100* 100* 103* 109* 86  BUN 45*   < > QUESTIONABLE IDENTIFICATION / INCORRECTLY LABELED SPECIMEN   < > 41* 34* 32* 33* 27*  CREATININE 1.03   < > QUESTIONABLE IDENTIFICATION / INCORRECTLY LABELED SPECIMEN   < >  1.11 0.88 1.09 0.87 0.78  CALCIUM 8.5*   < > QUESTIONABLE IDENTIFICATION / INCORRECTLY LABELED SPECIMEN   < > 8.5* 8.5* 8.8* 8.4* 8.6*  MG 2.8*  --  QUESTIONABLE IDENTIFICATION / INCORRECTLY LABELED SPECIMEN  --  2.5* 2.6* 2.6*  --   --    < > = values in this interval not displayed.   GFR: Estimated Creatinine Clearance: 57.6 mL/min (by C-G formula based on SCr of 0.78 mg/dL). Liver Function Tests: Recent Labs  Lab 03/24/20 1656 03/25/20 0631  AST 46* 36  ALT 27 19  ALKPHOS 69 54  BILITOT 2.0* 1.7*  PROT 7.8 5.9*  ALBUMIN 3.6 2.8*   No results for input(s): LIPASE, AMYLASE in the last 168 hours. No results for input(s): AMMONIA in the last 168 hours. Coagulation Profile: No results for input(s): INR, PROTIME in the last 168 hours. Cardiac Enzymes: No results for input(s): CKTOTAL, CKMB, CKMBINDEX, TROPONINI in the last 168 hours. BNP (last 3 results) No results for input(s): PROBNP in the last 8760 hours. HbA1C: No results for input(s): HGBA1C in the last 72 hours. CBG: Recent Labs  Lab 03/26/20 2052  GLUCAP 141*   Lipid Profile: No results for input(s): CHOL, HDL, LDLCALC, TRIG, CHOLHDL, LDLDIRECT in the last 72 hours. Thyroid Function Tests: No results for input(s): TSH, T4TOTAL, FREET4, T3FREE, THYROIDAB in the last 72 hours. Anemia Panel: No results for input(s): VITAMINB12, FOLATE, FERRITIN, TIBC, IRON, RETICCTPCT in the last 72 hours. Sepsis Labs: Recent Labs  Lab 03/25/20 0631  PROCALCITON <0.10    Recent Results (from the past 240 hour(s))  Resp Panel by RT-PCR (Flu A&B, Covid) Nasopharyngeal Swab     Status: Abnormal   Collection Time: 03/24/20  7:17 PM   Specimen: Nasopharyngeal Swab; Nasopharyngeal(NP) swabs in vial transport medium  Result Value Ref Range Status   SARS Coronavirus 2 by RT PCR POSITIVE (A) NEGATIVE Final    Comment: RESULT CALLED TO, READ BACK BY AND VERIFIED WITH: MAC BROWN,RN 2101 03/24/2020 DB (NOTE) SARS-CoV-2 target nucleic  acids are DETECTED.  The SARS-CoV-2 RNA is generally detectable in upper respiratory specimens during the acute phase of infection. Positive results are indicative of the presence of the identified virus, but do not rule out bacterial infection or co-infection with other pathogens not detected by the test. Clinical correlation with patient history and other diagnostic information is necessary to determine patient infection status. The expected result  is Negative.  Fact Sheet for Patients: EntrepreneurPulse.com.au  Fact Sheet for Healthcare Providers: IncredibleEmployment.be  This test is not yet approved or cleared by the [place] FDA and  has been authorized for detection and/or diagnosis of SARS-CoV-2 by FDA under an Emergency Use Authorization (EUA).  This EUA will remain in effect (meaning this test can be u sed) for the duration of  the COVID-19 declaration under Section 564(b)(1) of the Act, 21 U.S.C. section 360bbb-3(b)(1), unless the authorization is terminated or revoked sooner.     Influenza A by PCR NEGATIVE NEGATIVE Final   Influenza B by PCR NEGATIVE NEGATIVE Final    Comment: (NOTE) The Xpert Xpress SARS-CoV-2/FLU/RSV plus assay is intended as an aid in the diagnosis of influenza from Nasopharyngeal swab specimens and should not be used as a sole basis for treatment. Nasal washings and aspirates are unacceptable for Xpert Xpress SARS-CoV-2/FLU/RSV testing.  Fact Sheet for Patients: EntrepreneurPulse.com.au  Fact Sheet for Healthcare Providers: IncredibleEmployment.be  This test is not yet approved or cleared by the [place] FDA and has been authorized for detection and/or diagnosis of SARS-CoV-2 by FDA under an Emergency Use Authorization (EUA). This EUA will remain in effect (meaning this test can be used) for the duration of the COVID-19 declaration under Section 564(b)(1) of  the Act, 21 U.S.C. section 360bbb-3(b)(1), unless the authorization is terminated or revoked.  Performed at [facility], [address], [place], [place] [address]          Radiology Studies: No results found.      Scheduled Meds: . albuterol  2 puff Inhalation Q6H  . amLODipine  5 mg Oral Daily  . apixaban  10 mg Oral BID   Followed by  . [START ON 04/01/2020] apixaban  5 mg Oral BID  . vitamin C  500 mg Oral Daily  . dexamethasone  6 mg Oral Daily  . feeding supplement  237 mL Oral TID BM  . metoprolol tartrate  12.5 mg Oral BID  . mirtazapine  15 mg Oral QHS  . QUEtiapine  100 mg Oral QHS  . zinc sulfate  220 mg Oral Daily   Continuous Infusions:   LOS: 7 days    Time spent: 25 minutes    Sidney Ace, MD [facility] Pager [phone]  If 7PM-7AM, please contact night-coverage 03/31/2020, 12:19 PM

## 2020-03-31 NOTE — Progress Notes (Signed)
Pt is somewhat unmotivated to eat individually, but when assisted and prompted to eat his appetite is good. Pt ate 50% of food on plate for breakfast, drank all 2% milk, all of EnsurePlus and most of Magic Cup.

## 2020-03-31 NOTE — Progress Notes (Signed)
Nutrition Brief Note  RD received consult for 48hr Calorie Count  Calorie count envelope hung on patients door. RN notified. Please keep all meal receipts and document the percentage of each food item and supplement consumed. Results will be available on 12/6.  Betsey Holiday MS, RD, LDN Please refer to [facility] for RD and/or RD on-call/weekend/after hours pager

## 2020-03-31 NOTE — Progress Notes (Signed)
°   03/31/20 0045  Assess: MEWS Score  Temp 98.9 F (37.2 C)  BP (!) 209/95  Pulse Rate 66  Resp 18  SpO2 100 %  O2 Device Room Air  Patient Activity (if Appropriate) In bed  Assess: MEWS Score  MEWS Temp 0  MEWS Systolic 2  MEWS Pulse 0  MEWS RR 0  MEWS LOC 0  MEWS Score 2  MEWS Score Color Yellow  Assess: if the MEWS score is Yellow or Red  Were vital signs taken at a resting state? Yes  Focused Assessment No change from prior assessment  Early Detection of Sepsis Score *See Row Information* Low  MEWS guidelines implemented *See Row Information* Yes  Treat  MEWS Interventions Administered prn meds/treatments  Pain Scale 0-10  Pain Score 0  Patients Stated Pain Goal 0  Take Vital Signs  Increase Vital Sign Frequency  Yellow: Q 2hr X 2 then Q 4hr X 2, if remains yellow, continue Q 4hrs  Notify: Charge Nurse/RN  Name of Charge Nurse/RN Notified Phyllis RN  Date Charge Nurse/RN Notified 03/30/20  Time Charge Nurse/RN Notified 0045  Document  Patient Outcome Other (Comment) (prn medication given)

## 2020-03-31 NOTE — Care Management Important Message (Signed)
Important Message  Patient Details  Name: Darryl Haley MRN: [id number] Date of Birth: [birth year]   Medicare Important Message Given:  Yes     Allayne Butcher, RN 03/31/2020, 12:23 PM

## 2020-04-01 ENCOUNTER — Inpatient Hospital Stay: Payer: Medicare PPO

## 2020-04-01 DIAGNOSIS — G9341 Metabolic encephalopathy: Secondary | ICD-10-CM | POA: Diagnosis not present

## 2020-04-01 MED ORDER — SENNOSIDES-DOCUSATE SODIUM 8.6-50 MG PO TABS
1.0000 | ORAL_TABLET | Freq: Two times a day (BID) | ORAL | Status: DC
  Administered 2020-04-01 – 2020-04-04 (×7): 1 via ORAL
  Filled 2020-04-01 (×6): qty 1

## 2020-04-01 NOTE — Plan of Care (Signed)
  Problem: Education: Goal: Knowledge of risk factors and measures for prevention of condition will improve Outcome: Progressing   Problem: Coping: Goal: Psychosocial and spiritual needs will be supported Outcome: Progressing   Problem: Respiratory: Goal: Will maintain a patent airway Outcome: Progressing Goal: Complications related to the disease process, condition or treatment will be avoided or minimized Outcome: Progressing   

## 2020-04-01 NOTE — Plan of Care (Signed)
Patient alert to self.  Calm.  Appetite seems to improve. Patient ate 75% of breakfast and 40% of dinner.  Patient likes chocolate Ensures.  Last BM unknown.  Senokot initiated.

## 2020-04-01 NOTE — Progress Notes (Signed)
PROGRESS NOTE    Darryl Haley  WNU:[id number] DOB: 11-Jun-1929 DOA: 03/24/2020 Darryl Haley: Darryl Haley, No    Brief Narrative:  Darryl Haley a 86 y.o.malewithno significant past medical history and who has not seen a doctor in 8 years and who lives independently but whose son checks in on him twice a day, who was brought in under involuntary commitment due to concerns for confusion, lethargy and not eating. Most of the history is taken from his son who states that patient was in his usual state of health until the past 2 weeks when he appeared more confused than his baseline.  At baseline he is independent in his ADLs but has been noted to have some memory problems over the past 5 months.    12/1: Patient is more alert and awake today.  Ate 75% of breakfast.  Requesting coffee.  Much more oriented to time place and person today.  12/2: Patient remains hemodynamically stable.  Has had some issues with sundowning and nocturnal delirium.  Received Haldol 100 this morning.  Sleepy on my evaluation.  12/3: Patient remains on room air and hemodynamically stable.  P.o. intake remains poor.  Registered dietitian recommending initiation of tube feeds given severe malnutrition.  Per palliative care this is in line with the family's desires as well.  12/4: Calorie count initiated yesterday.  Patient remains on room air, hemodynamically stable.   Assessment & Plan:   Principal Problem:   Acute metabolic encephalopathy Active Problems:   Pneumonia due to COVID-19 virus   AKI (acute kidney injury) (HCC)   Elevated troponin   Rapid atrial fibrillation, new onset (HCC)   Inadequate oral nutritional intake   Cognitive dysfunction   Unintentional weight loss   Elevated blood-pressure reading without prior diagnosis of hypertension   Protein-calorie malnutrition, severe  Inadequate oral nutritional intake Unintentional weight loss Severe malnutrition  -Son reports weight loss of 50 pounds over the past 5  months with decreased oral intake over the past 2 weeks --liberalize diet to regular --Ensure TID --Palliative medicine discussed with family.  They are desiring tube feeds should the clinical situation dictate Plan: 48-hour calorie count initiated.  Will be complete 04/03/2020 Continue to encourage patient to eat as much as possible Anticipate need for Dobbhoff tube placement sometime within the next 24 to 48 hours.  I have discussed this with the patient.  AMS 2/2 COVID encephalopathy Suspect underlying cognitive dysfunction and dementia -Patient presents with 2-week history of confusion and paranoid ideation, lethargy, with 51-monthhistory of cognitive dysfunction. Independent in ADLs at baseline. -Likely multifactorial related to Covid, dehydration -Seems to be improved over interval -Some sundowning/hospital-acquired delirium noted.  Could also be steroid effect PLAN: -Treat acute etiologies, outlined below -Fall and aspiration precautions --Seroquel 100 mg nightly for delirium and agitation -Haldol PRN for agitation --Palliative medicine to follow-up  Pneumonia due to COVID-19 virus -Patient with 5-day history of cough but no shortness of breath or fever and not hypoxic -Covid PCR positive with chest x-ray showing multifocal airspace opacities --completed Remdesivir. PLAN: --cont oral decadron, day 8/10 -albuterol, antitussives and vitamins  Acute PE, POA --D-dimer >7500 on presentation.  CTA chest positive for PE. --Patient has been stable on room air nontachycardic PLAN: --cont Eliquis  AKI (acute kidney injury) ([place] -Creatinine of 1.55 with BUN 62, suspect prerenal related to decreased oral intake over the past 2 weeks related to his acute medical condition.  Cr improved with IVF. PLAN: --Hold further IVF and encourage oral hydration.  Rapid atrial fibrillation, new onset (Darryl Haley) -EKG with rapid A. fib rate 121 -CHA2DS2-VASc score of 3 based on age of 24 and  suspect underlying undiagnosed hypertension --rate controlled with Metoprolol 25 mg twice daily PLAN: --cont metop 25 mg BID --cont Eliquis (started for PE)  Elevated troponin 2/2 demand ischemia -Troponin 40 and flat.  Suspect supply demand mismatch related to rapid A. fib and Covid pneumonia  Elevated blood-pressure reading without prior diagnosis of hypertension Suspect essential hypertension -BP 167/98 on arrival and remaining elevated -Suspect undiagnosed hypertension. Patient has not seen a physician in 8 years PLAN: --cont metop 25 mg BID   DVT prophylaxis: Eliquis Code Status: Full Family Communication: Son Darryl Haley [phone] on 03/31/2020 Disposition Plan: Status is: Inpatient  Remains inpatient appropriate because:Inpatient level of care appropriate due to severity of illness   Dispo: The patient is from: Home              Anticipated d/c is to: Home              Anticipated d/c date is: 3 days              Patient currently is not medically stable to d/c.   Severe malnutrition is persistent.  P.o. intake remains rather poor.  48-hour calorie count in progress.  Not medically stable for discharge at this time.   Consultants:   Palliative care  Procedures:   None  Antimicrobials:  None   Subjective: Patient  Objective: Vitals:   03/31/20 2046 03/31/20 2344 04/01/20 0449 04/01/20 0827  BP: 134/63 (!) 130/58 138/68 (!) 153/83  Pulse: 82 80 80 69  Resp: _0 Temp: 98.3 F (36.8 C) 98 F (36.7 C) 98 F (36.7 C) 98.4 F (36.9 C)  TempSrc:      SpO2: 98% 98% 96% 99%  Weight:      Height:        Intake/Output Summary (Last 24 hours) at 04/01/2020 1135 Last data filed at 04/01/2020 0830 Gross per 24 hour  Intake 120 ml  Output 900 ml  Net -780 ml   Filed Weights   03/24/20 1646 03/25/20 2049 03/28/20 1046  Weight: 79.4 kg 65.5 kg 65.6 kg    Examination:  General exam: No acute distress.  Appears  frail Respiratory system: Poor respiratory effort.  Lungs overall clear.  Room air Cardiovascular system: S1 & S2 heard, RRR. No JVD, murmurs, rubs, gallops or clicks. No pedal edema. Gastrointestinal system: Abdomen is nondistended, soft and nontender. No organomegaly or masses felt. Normal bowel sounds heard. Central nervous system: Sleepy, oriented to person and place.  No focal deficits Extremities: Symmetric 5 x 5 power. Skin: No rashes, lesions or ulcers Psychiatry: Judgement and insight appear impaired. Mood & affect flattened.     Data Reviewed: I have personally reviewed following labs and imaging studies  CBC: Recent Labs  Lab 03/27/20 0641 03/28/20 0757 03/29/20 0631 03/30/20 0445 03/31/20 0526  WBC 10.0 8.7 11.6* 12.1* 15.0*  HGB 15.2 15.6 17.2* 14.8 16.4  HCT 44.4 47.2 52.6* 44.5 47.8  MCV 88.4 88.9 89.9 89.7 88.4  PLT 199 219 268 212 676   Basic Metabolic Panel: Recent Labs  Lab 03/26/20 0427 03/26/20 0427 03/27/20 0504 03/27/20 0504 03/27/20 0641 03/28/20 0757 03/29/20 0631 03/30/20 0445 03/31/20 0526  NA 147*   < > QUESTIONABLE IDENTIFICATION / INCORRECTLY LABELED SPECIMEN   < > 139 140 144 139 138  K 4.0   < >  QUESTIONABLE IDENTIFICATION / INCORRECTLY LABELED SPECIMEN   < > 5.0 4.1 4.2 4.2 4.3  CL 109   < > QUESTIONABLE IDENTIFICATION / INCORRECTLY LABELED SPECIMEN   < > 104 104 101 101 98  CO2 25   < > QUESTIONABLE IDENTIFICATION / INCORRECTLY LABELED SPECIMEN   < > 27 26 33* 29 31  GLUCOSE 98   < > QUESTIONABLE IDENTIFICATION / INCORRECTLY LABELED SPECIMEN   < > 100* 100* 103* 109* 86  BUN 45*   < > QUESTIONABLE IDENTIFICATION / INCORRECTLY LABELED SPECIMEN   < > 41* 34* 32* 33* 27*  CREATININE 1.03   < > QUESTIONABLE IDENTIFICATION / INCORRECTLY LABELED SPECIMEN   < > 1.11 0.88 1.09 0.87 0.78  CALCIUM 8.5*   < > QUESTIONABLE IDENTIFICATION / INCORRECTLY LABELED SPECIMEN   < > 8.5* 8.5* 8.8* 8.4* 8.6*  MG 2.8*  --  QUESTIONABLE IDENTIFICATION /  INCORRECTLY LABELED SPECIMEN  --  2.5* 2.6* 2.6*  --   --    < > = values in this interval not displayed.   GFR: Estimated Creatinine Clearance: 57.6 mL/min (by C-G formula based on SCr of 0.78 mg/dL). Liver Function Tests: No results for input(s): AST, ALT, ALKPHOS, BILITOT, PROT, ALBUMIN in the last 168 hours. No results for input(s): LIPASE, AMYLASE in the last 168 hours. No results for input(s): AMMONIA in the last 168 hours. Coagulation Profile: No results for input(s): INR, PROTIME in the last 168 hours. Cardiac Enzymes: No results for input(s): CKTOTAL, CKMB, CKMBINDEX, TROPONINI in the last 168 hours. BNP (last 3 results) No results for input(s): PROBNP in the last 8760 hours. HbA1C: No results for input(s): HGBA1C in the last 72 hours. CBG: Recent Labs  Lab 03/26/20 2052  GLUCAP 141*   Lipid Profile: No results for input(s): CHOL, HDL, LDLCALC, TRIG, CHOLHDL, LDLDIRECT in the last 72 hours. Thyroid Function Tests: No results for input(s): TSH, T4TOTAL, FREET4, T3FREE, THYROIDAB in the last 72 hours. Anemia Panel: No results for input(s): VITAMINB12, FOLATE, FERRITIN, TIBC, IRON, RETICCTPCT in the last 72 hours. Sepsis Labs: No results for input(s): PROCALCITON, LATICACIDVEN in the last 168 hours.  Recent Results (from the past 240 hour(s))  Resp Panel by RT-PCR (Flu A&B, Covid) Nasopharyngeal Swab     Status: Abnormal   Collection Time: 03/24/20  7:17 PM   Specimen: Nasopharyngeal Swab; Nasopharyngeal(NP) swabs in vial transport medium  Result Value Ref Range Status   SARS Coronavirus 2 by RT PCR POSITIVE (A) NEGATIVE Final    Comment: RESULT CALLED TO, READ BACK BY AND VERIFIED WITH: MAC BROWN,RN [id number] 03/24/2020 DB (NOTE) SARS-CoV-2 target nucleic acids are DETECTED.  The SARS-CoV-2 RNA is generally detectable in upper respiratory specimens during the acute phase of infection. Positive results are indicative of the presence of the identified virus, but do not  rule out bacterial infection or co-infection with other pathogens not detected by the test. Clinical correlation with patient history and other diagnostic information is necessary to determine patient infection status. The expected result is Negative.  Fact Sheet for Patients: EntrepreneurPulse.com.au  Fact Sheet for Healthcare Providers: IncredibleEmployment.be  This test is not yet approved or cleared by the [place] FDA and  has been authorized for detection and/or diagnosis of SARS-CoV-2 by FDA under an Emergency Use Authorization (EUA).  This EUA will remain in effect (meaning this test can be u sed) for the duration of  the COVID-19 declaration under Section 564(b)(1) of the Act, 21 U.S.C. section 360bbb-3(b)(1), unless  the authorization is terminated or revoked sooner.     Influenza A by PCR NEGATIVE NEGATIVE Final   Influenza B by PCR NEGATIVE NEGATIVE Final    Comment: (NOTE) The Xpert Xpress SARS-CoV-2/FLU/RSV plus assay is intended as an aid in the diagnosis of influenza from Nasopharyngeal swab specimens and should not be used as a sole basis for treatment. Nasal washings and aspirates are unacceptable for Xpert Xpress SARS-CoV-2/FLU/RSV testing.  Fact Sheet for Patients: EntrepreneurPulse.com.au  Fact Sheet for Healthcare Providers: IncredibleEmployment.be  This test is not yet approved or cleared by the [place] FDA and has been authorized for detection and/or diagnosis of SARS-CoV-2 by FDA under an Emergency Use Authorization (EUA). This EUA will remain in effect (meaning this test can be used) for the duration of the COVID-19 declaration under Section 564(b)(1) of the Act, 21 U.S.C. section 360bbb-3(b)(1), unless the authorization is terminated or revoked.  Performed at [facility], [address], [place], [place] [address]          Radiology Studies: No  results found.      Scheduled Meds: . albuterol  2 puff Inhalation Q6H  . amLODipine  5 mg Oral Daily  . apixaban  10 mg Oral BID   Followed by  . apixaban  5 mg Oral BID  . vitamin C  500 mg Oral Daily  . dexamethasone  6 mg Oral Daily  . feeding supplement  237 mL Oral TID BM  . metoprolol tartrate  12.5 mg Oral BID  . mirtazapine  15 mg Oral QHS  . QUEtiapine  100 mg Oral QHS  . zinc sulfate  220 mg Oral Daily   Continuous Infusions:   LOS: 8 days    Time spent: 15 minutes    Sidney Ace, MD [facility] Pager [phone]  If 7PM-7AM, please contact night-coverage 04/01/2020, 11:35 AM

## 2020-04-02 DIAGNOSIS — G9341 Metabolic encephalopathy: Secondary | ICD-10-CM | POA: Diagnosis not present

## 2020-04-02 LAB — BASIC METABOLIC PANEL
Anion gap: 8 (ref 5–15)
BUN: 34 mg/dL — ABNORMAL HIGH (ref 8–23)
CO2: 30 mmol/L (ref 22–32)
Calcium: 8.7 mg/dL — ABNORMAL LOW (ref 8.9–10.3)
Chloride: 99 mmol/L (ref 98–111)
Creatinine, Ser: 0.89 mg/dL (ref 0.61–1.24)
GFR, Estimated: >60 mL/min (ref >60)
Glucose, Bld: 98 mg/dL (ref 70–99)
Potassium: 4.9 mmol/L (ref 3.5–5.1)
Sodium: 137 mmol/L (ref 135–145)

## 2020-04-02 LAB — CBC WITH DIFFERENTIAL/PLATELET
Abs Immature Granulocytes: 0.18 10*3/uL — ABNORMAL HIGH (ref 0.00–0.07)
Basophils Absolute: 0 10*3/uL (ref 0.0–0.1)
Basophils Relative: 0 %
Eosinophils Absolute: 0 10*3/uL (ref 0.0–0.5)
Eosinophils Relative: 0 %
HCT: 41.2 % (ref 39.0–52.0)
Hemoglobin: 13.8 g/dL (ref 13.0–17.0)
Immature Granulocytes: 1 %
Lymphocytes Relative: 4 %
Lymphs Abs: 0.7 10*3/uL (ref 0.7–4.0)
MCH: 29.9 pg (ref 26.0–34.0)
MCHC: 33.5 g/dL (ref 30.0–36.0)
MCV: 89.2 fL (ref 80.0–100.0)
Monocytes Absolute: 0.8 10*3/uL (ref 0.1–1.0)
Monocytes Relative: 5 %
Neutro Abs: 13.6 10*3/uL — ABNORMAL HIGH (ref 1.7–7.7)
Neutrophils Relative %: 90 %
Platelets: 238 10*3/uL (ref 150–400)
RBC: 4.62 MIL/uL (ref 4.22–5.81)
RDW: 13.5 % (ref 11.5–15.5)
WBC: 15.3 10*3/uL — ABNORMAL HIGH (ref 4.0–10.5)
nRBC: 0 % (ref 0.0–0.2)

## 2020-04-02 LAB — MAGNESIUM: Magnesium: 2.2 mg/dL (ref 1.7–2.4)

## 2020-04-02 LAB — PHOSPHORUS: Phosphorus: 2 mg/dL — ABNORMAL LOW (ref 2.5–4.6)

## 2020-04-02 MED ORDER — HALOPERIDOL LACTATE 5 MG/ML IJ SOLN: 2.0000 mg | Freq: Three times a day (TID) | INTRAMUSCULAR | Status: DC | PRN

## 2020-04-02 MED ORDER — HALOPERIDOL 2 MG PO TABS
2.0000 mg | ORAL_TABLET | Freq: Three times a day (TID) | ORAL | Status: DC | PRN
  Administered 2020-04-03 (×2): 2 mg via ORAL
  Filled 2020-04-02 (×3): qty 1

## 2020-04-02 MED ORDER — POLYETHYLENE GLYCOL 3350 17 G PO PACK
17.0000 g | PACK | Freq: Every day | ORAL | Status: DC
  Administered 2020-04-02 – 2020-04-04 (×3): 17 g via ORAL
  Filled 2020-04-02 (×3): qty 1

## 2020-04-02 NOTE — Progress Notes (Signed)
PROGRESS NOTE    Darryl Haley  LYY:[id number] DOB: [birth year] DOA: 03/24/2020 PCP: Pcp, No    Brief Narrative:  Darryl Haley a 86 y.o.malewithno significant past medical history and who has not seen a doctor in 8 years and who lives independently but whose son checks in on him twice a day, who was brought in under involuntary commitment due to concerns for confusion, lethargy and not eating. Most of the history is taken from his son who states that patient was in his usual state of health until the past 2 weeks when he appeared more confused than his baseline.  At baseline he is independent in his ADLs but has been noted to have some memory problems over the past 5 months.    12/1: Patient is more alert and awake today.  Ate 75% of breakfast.  Requesting coffee.  Much more oriented to time place and person today.  12/2: Patient remains hemodynamically stable.  Has had some issues with sundowning and nocturnal delirium.  Received Haldol 100 this morning.  Sleepy on my evaluation.  12/3: Patient remains on room air and hemodynamically stable.  P.o. intake remains poor.  Registered dietitian recommending initiation of tube feeds given severe malnutrition.  Per palliative care this is in line with the family's desires as well.  12/4: Calorie count initiated yesterday.  Patient remains on room air, hemodynamically stable.   Assessment & Plan:   Principal Problem:   Acute metabolic encephalopathy Active Problems:   Pneumonia due to COVID-19 virus   AKI (acute kidney injury) (HCC)   Elevated troponin   Rapid atrial fibrillation, new onset (HCC)   Inadequate oral nutritional intake   Cognitive dysfunction   Unintentional weight loss   Elevated blood-pressure reading without prior diagnosis of hypertension   Protein-calorie malnutrition, severe  Inadequate oral nutritional intake Unintentional weight loss Severe malnutrition  -Son reports weight loss of 50 pounds over the past 5  months with decreased oral intake over the past 2 weeks --liberalize diet to regular --Ensure TID --Palliative medicine discussed with family.  They are desiring tube feeds should the clinical situation dictate --P.o. intake has actually been improving with frequent encouragement Plan: 48-hour calorie count initiated.  Will complete 04/03/2020 Continue to encourage patient to eat as much as possible.  This has been effective Reconsider tube feed placement on 04/03/2020 after evaluation by RD and palliative care Continue nightly Remeron  AMS 2/2 COVID encephalopathy Suspect underlying cognitive dysfunction and dementia -Patient presents with 2-week history of confusion and paranoid ideation, lethargy, with 20-monthhistory of cognitive dysfunction. Independent in ADLs at baseline. -Likely multifactorial related to Covid, dehydration -Seems to be improved over interval -Some sundowning/hospital-acquired delirium noted.  Could also be steroid effect PLAN: -Treat acute etiologies, outlined below -Fall and aspiration precautions --Seroquel 100 mg nightly for delirium and agitation -Haldol PRN for agitation --Palliative medicine to follow-up  Pneumonia due to COVID-19 virus -Patient with 5-day history of cough but no shortness of breath or fever and not hypoxic -Covid PCR positive with chest x-ray showing multifocal airspace opacities --completed Remdesivir. PLAN: --cont oral decadron, day 9/10 -albuterol, antitussives and vitamins  Acute PE, POA --D-dimer >7500 on presentation.  CTA chest positive for PE. --Patient has been stable on room air nontachycardic PLAN: --cont Eliquis  [place] -Creatinine of 1.55 with BUN 62, suspect prerenal related to decreased oral intake over the past 2 weeks related to his acute medical condition.  Cr improved with IVF. PLAN: --Hold  further IVF and encourage oral hydration.  Rapid atrial fibrillation, new onset  ([place]) -EKG with rapid A. fib rate 121 -CHA2DS2-VASc score of 3 based on age of 30 and suspect underlying undiagnosed hypertension --rate controlled with Metoprolol 25 mg twice daily PLAN: --cont metop 25 mg BID --cont Eliquis (started for PE)  Elevated troponin 2/2 demand ischemia -Troponin 40 and flat.  Suspect supply demand mismatch related to rapid A. fib and Covid pneumonia  Elevated blood-pressure reading without prior diagnosis of hypertension Suspect essential hypertension -BP 167/98 on arrival and remaining elevated -Suspect undiagnosed hypertension. Patient has not seen a physician in 8 years PLAN: --cont metop 25 mg BID   DVT prophylaxis: Eliquis Code Status: Full Family Communication: Son Saquan Furtick via phone [phone] on 04/02/2020 Disposition Plan: Status is: Inpatient  Remains inpatient appropriate because:Inpatient level of care appropriate due to severity of illness   Dispo: The patient is from: Home              Anticipated d/c is to: Home              Anticipated d/c date is: 3 days              Patient currently is not medically stable to d/c.   Patient remains with poor p.o. intake though picking up slightly.  Will reassess tomorrow after 48-hour calorie count is complete and reevaluation by RD and palliative care.   Consultants:   Palliative care  Procedures:   None  Antimicrobials:  None   Subjective: Patient  Objective: Vitals:   04/01/20 2125 04/02/20 0009 04/02/20 0557 04/02/20 0845  BP: 129/68 (!) 147/70 127/62 133/65  Pulse: 82 85 75 76  Resp: _0 Temp: 98.5 F (36.9 C) 97.7 F (36.5 C) (!) 97.5 F (36.4 C) (!) 97.4 F (36.3 C)  TempSrc: Oral Oral Oral   SpO2: 97% 99% 98%   Weight:      Height:        Intake/Output Summary (Last 24 hours) at 04/02/2020 1117 Last data filed at 04/01/2020 1727 Gross per 24 hour  Intake 952 ml  Output 350 ml  Net 602 ml   Filed Weights   03/24/20 1646 03/25/20 2049  03/28/20 1046  Weight: 79.4 kg 65.5 kg 65.6 kg    Examination:  General exam: No acute distress.  Appears frail Respiratory system: Poor respiratory effort.  Lungs overall clear.  Room air Cardiovascular system: S1 & S2 heard, RRR. No JVD, murmurs, rubs, gallops or clicks. No pedal edema. Gastrointestinal system: Abdomen is nondistended, soft and nontender. No organomegaly or masses felt. Normal bowel sounds heard. Central nervous system: Sleepy, oriented to person and place.  No focal deficits Extremities: Symmetric 5 x 5 power. Skin: No rashes, lesions or ulcers Psychiatry: Judgement and insight appear impaired. Mood & affect flattened.     Data Reviewed: I have personally reviewed following labs and imaging studies  CBC: Recent Labs  Lab 03/28/20 0757 03/29/20 0631 03/30/20 0445 03/31/20 0526 04/02/20 0553  WBC 8.7 11.6* 12.1* 15.0* 15.3*  NEUTROABS  --   --   --   --  13.6*  HGB 15.6 17.2* 14.8 16.4 13.8  HCT 47.2 52.6* 44.5 47.8 41.2  MCV 88.9 89.9 89.7 88.4 89.2  PLT 219 268 212 253 098   Basic Metabolic Panel: Recent Labs  Lab 03/27/20 0504 03/27/20 0504 03/27/20 1191 03/27/20 4782 03/28/20 0757 03/29/20 0631 03/30/20 0445 03/31/20 0526 04/02/20 0553  NA  QUESTIONABLE IDENTIFICATION / INCORRECTLY LABELED SPECIMEN   < > 139   < > 140 144 139 138 137  K QUESTIONABLE IDENTIFICATION / INCORRECTLY LABELED SPECIMEN   < > 5.0   < > 4.1 4.2 4.2 4.3 4.9  CL QUESTIONABLE IDENTIFICATION / INCORRECTLY LABELED SPECIMEN   < > 104   < > 104 101 101 98 99  CO2 QUESTIONABLE IDENTIFICATION / INCORRECTLY LABELED SPECIMEN   < > 27   < > 26 33* _0 GLUCOSE QUESTIONABLE IDENTIFICATION / INCORRECTLY LABELED SPECIMEN   < > 100*   < > 100* 103* 109* 86 98  BUN QUESTIONABLE IDENTIFICATION / INCORRECTLY LABELED SPECIMEN   < > 41*   < > 34* 32* 33* 27* 34*  CREATININE QUESTIONABLE IDENTIFICATION / INCORRECTLY LABELED SPECIMEN   < > 1.11   < > 0.88 1.09 0.87 0.78 0.89  CALCIUM  QUESTIONABLE IDENTIFICATION / INCORRECTLY LABELED SPECIMEN   < > 8.5*   < > 8.5* 8.8* 8.4* 8.6* 8.7*  MG QUESTIONABLE IDENTIFICATION / INCORRECTLY LABELED SPECIMEN  --  2.5*  --  2.6* 2.6*  --   --  2.2  PHOS  --   --   --   --   --   --   --   --  2.0*   < > = values in this interval not displayed.   GFR: Estimated Creatinine Clearance: 51.8 mL/min (by C-G formula based on SCr of 0.89 mg/dL). Liver Function Tests: No results for input(s): AST, ALT, ALKPHOS, BILITOT, PROT, ALBUMIN in the last 168 hours. No results for input(s): LIPASE, AMYLASE in the last 168 hours. No results for input(s): AMMONIA in the last 168 hours. Coagulation Profile: No results for input(s): INR, PROTIME in the last 168 hours. Cardiac Enzymes: No results for input(s): CKTOTAL, CKMB, CKMBINDEX, TROPONINI in the last 168 hours. BNP (last 3 results) No results for input(s): PROBNP in the last 8760 hours. HbA1C: No results for input(s): HGBA1C in the last 72 hours. CBG: Recent Labs  Lab 03/26/20 2052  GLUCAP 141*   Lipid Profile: No results for input(s): CHOL, HDL, LDLCALC, TRIG, CHOLHDL, LDLDIRECT in the last 72 hours. Thyroid Function Tests: No results for input(s): TSH, T4TOTAL, FREET4, T3FREE, THYROIDAB in the last 72 hours. Anemia Panel: No results for input(s): VITAMINB12, FOLATE, FERRITIN, TIBC, IRON, RETICCTPCT in the last 72 hours. Sepsis Labs: No results for input(s): PROCALCITON, LATICACIDVEN in the last 168 hours.  Recent Results (from the past 240 hour(s))  Resp Panel by RT-PCR (Flu A&B, Covid) Nasopharyngeal Swab     Status: Abnormal   Collection Time: 03/24/20  7:17 PM   Specimen: Nasopharyngeal Swab; Nasopharyngeal(NP) swabs in vial transport medium  Result Value Ref Range Status   SARS Coronavirus 2 by RT PCR POSITIVE (A) NEGATIVE Final    Comment: RESULT CALLED TO, READ BACK BY AND VERIFIED WITH: MAC BROWN,RN 2101 03/24/2020 DB (NOTE) SARS-CoV-2 target nucleic acids are  DETECTED.  The SARS-CoV-2 RNA is generally detectable in upper respiratory specimens during the acute phase of infection. Positive results are indicative of the presence of the identified virus, but do not rule out bacterial infection or co-infection with other pathogens not detected by the test. Clinical correlation with patient history and other diagnostic information is necessary to determine patient infection status. The expected result is Negative.  Fact Sheet for Patients: EntrepreneurPulse.com.au  Fact Sheet for Healthcare Providers: IncredibleEmployment.be  This test is not yet approved or cleared by the [place]  States FDA and  has been authorized for detection and/or diagnosis of SARS-CoV-2 by FDA under an Emergency Use Authorization (EUA).  This EUA will remain in effect (meaning this test can be u sed) for the duration of  the COVID-19 declaration under Section 564(b)(1) of the Act, 21 U.S.C. section 360bbb-3(b)(1), unless the authorization is terminated or revoked sooner.     Influenza A by PCR NEGATIVE NEGATIVE Final   Influenza B by PCR NEGATIVE NEGATIVE Final    Comment: (NOTE) The Xpert Xpress SARS-CoV-2/FLU/RSV plus assay is intended as an aid in the diagnosis of influenza from Nasopharyngeal swab specimens and should not be used as a sole basis for treatment. Nasal washings and aspirates are unacceptable for Xpert Xpress SARS-CoV-2/FLU/RSV testing.  Fact Sheet for Patients: EntrepreneurPulse.com.au  Fact Sheet for Healthcare Providers: IncredibleEmployment.be  This test is not yet approved or cleared by the [place] FDA and has been authorized for detection and/or diagnosis of SARS-CoV-2 by FDA under an Emergency Use Authorization (EUA). This EUA will remain in effect (meaning this test can be used) for the duration of the COVID-19 declaration under Section 564(b)(1) of the Act, 21  U.S.C. section 360bbb-3(b)(1), unless the authorization is terminated or revoked.  Performed at [facility], [address] [place], [place] [address]          Radiology Studies: DG Abd 1 View  Result Date: 04/01/2020 CLINICAL DATA:  COVID-19 positive on 11/26.  Constipation. EXAM: ABDOMEN - 1 VIEW COMPARISON:  None. FINDINGS: Mild to moderate increase in colonic and rectal stool burden. No bowel dilation to suggest obstruction. No evidence of renal or ureteral stones. Scattered arterial atherosclerotic calcifications. Soft tissues otherwise unremarkable. Degenerative changes of the lumbar spine. No acute skeletal abnormality. IMPRESSION: 1. No acute findings.  No bowel obstruction. 2. Mild to moderate increase in colonic and rectal stool. Electronically Signed   By: Lajean Manes M.D.   On: 04/01/2020 13:48        Scheduled Meds: . albuterol  2 puff Inhalation Q6H  . amLODipine  5 mg Oral Daily  . apixaban  5 mg Oral BID  . vitamin C  500 mg Oral Daily  . dexamethasone  6 mg Oral Daily  . feeding supplement  237 mL Oral TID BM  . metoprolol tartrate  12.5 mg Oral BID  . mirtazapine  15 mg Oral QHS  . polyethylene glycol  17 g Oral Daily  . QUEtiapine  100 mg Oral QHS  . senna-docusate  1 tablet Oral BID  . zinc sulfate  220 mg Oral Daily   Continuous Infusions:   LOS: 9 days    Time spent: 15 minutes    Sidney Ace, MD [facility] Pager [phone]  If 7PM-7AM, please contact night-coverage 04/02/2020, 11:17 AM

## 2020-04-02 NOTE — Progress Notes (Signed)
Pt up OOB despite alarms and instructions to wait for staff. This nurse assisted pt to bathroom. Pt returned to bed, but soon after was OOB and playing with ventilation fan in room. Multiple attempts to redirect patient were unsuccessful. PRN haldol given.

## 2020-04-02 NOTE — NC FL2 (Signed)
[place] MEDICAID FL2 LEVEL OF CARE SCREENING TOOL     IDENTIFICATION  Patient Name: Darryl Haley Birthdate: 02/15/1930 Sex: male Admission Date (Current Location): 03/24/2020  [place] and [place] Number:  Chiropodist and Address:  [facility], [address] [place], [place] [address]      Provider Number: [id number]  Attending Physician Name and Address:  Tresa Moore, MD  Relative Name and Phone Number:       Current Level of Care: Hospital Recommended Level of Care: Skilled Nursing Facility Prior Approval Number:    Date Approved/Denied:   PASRR Number: [id number]  Discharge Plan: SNF    Current Diagnoses: Patient Active Problem List   Diagnosis Date Noted  . Protein-calorie malnutrition, severe 03/27/2020  . Pneumonia due to COVID-19 virus 03/24/2020  . AKI (acute kidney injury) (HCC) 03/24/2020  . Elevated troponin 03/24/2020  . Rapid atrial fibrillation, new onset (HCC) 03/24/2020  . Inadequate oral nutritional intake 03/24/2020  . Acute metabolic encephalopathy 03/24/2020  . Cognitive dysfunction 03/24/2020  . Unintentional weight loss 03/24/2020  . Elevated blood-pressure reading without prior diagnosis of hypertension 03/24/2020    Orientation RESPIRATION BLADDER Height & Weight     Self  Normal Incontinent Weight: 144 lb 10 oz (65.6 kg) Height:  5\' 6"  (167.6 cm)  BEHAVIORAL SYMPTOMS/MOOD NEUROLOGICAL BOWEL NUTRITION STATUS      Incontinent Diet  AMBULATORY STATUS COMMUNICATION OF NEEDS Skin   Extensive Assist Verbally Normal                       Personal Care Assistance Level of Assistance  Bathing, Feeding, Dressing Bathing Assistance: Maximum assistance Feeding assistance: Limited assistance Dressing Assistance: Maximum assistance     Functional Limitations Info  Sight, Hearing, Speech Sight Info: Adequate Hearing Info: Adequate Speech Info: Adequate    SPECIAL CARE FACTORS FREQUENCY   PT (By licensed PT), OT (By licensed OT)     PT Frequency: 5x week OT Frequency: 5x week            Contractures Contractures Info: Not present    Additional Factors Info  Code Status Code Status Info: Full             Current Medications (04/02/2020):  This is the current hospital active medication list Current Facility-Administered Medications  Medication Dose Route Frequency Provider Last Rate Last Admin  . acetaminophen (TYLENOL) tablet 650 mg  650 mg Oral Q6H PRN 14/08/2019, MD      . albuterol (VENTOLIN HFA) 108 (90 Base) MCG/ACT inhaler 2 puff  2 puff Inhalation Q6H Andris Baumann, MD   2 puff at 04/02/20 0947  . amLODipine (NORVASC) tablet 5 mg  5 mg Oral Daily 14/05/21 B, MD   5 mg at 04/02/20 0948  . apixaban (ELIQUIS) tablet 5 mg  5 mg Oral BID 14/05/21, MD   5 mg at 04/02/20 0948  . ascorbic acid (VITAMIN C) tablet 500 mg  500 mg Oral Daily 14/05/21, MD   500 mg at 04/02/20 14/05/21  . chlorpheniramine-HYDROcodone (TUSSIONEX) 10-8 MG/5ML suspension 5 mL  5 mL Oral Q12H PRN [id number], MD   5 mL at 04/01/20 1937  . dexamethasone (DECADRON) tablet 6 mg  6 mg Oral Daily 14/04/21, MD   6 mg at 04/02/20 0948  . feeding supplement (ENSURE ENLIVE / ENSURE PLUS) liquid 237 mL  237 mL Oral TID BM 14/05/21,  MD   237 mL at 04/02/20 0952  . guaiFENesin-dextromethorphan (ROBITUSSIN DM) 100-10 MG/5ML syrup 10 mL  10 mL Oral Q4H PRN Lindajo Royal V, MD      . haloperidol lactate (HALDOL) injection 2 mg  2 mg Intravenous Q6H PRN Andris Baumann, MD   2 mg at 04/02/20 0458  . hydrALAZINE (APRESOLINE) injection 10 mg  10 mg Intravenous Q4H PRN Lolita Patella B, MD   10 mg at 03/31/20 0059  . metoprolol tartrate (LOPRESSOR) tablet 12.5 mg  12.5 mg Oral BID Lolita Patella B, MD   12.5 mg at 04/02/20 0948  . mirtazapine (REMERON) tablet 15 mg  15 mg Oral QHS Lolita Patella B, MD   15 mg at 04/01/20 1938  . ondansetron (ZOFRAN) tablet 4 mg  4 mg Oral Q6H  PRN Andris Baumann, MD       Or  . ondansetron [facility]) injection 4 mg  4 mg Intravenous Q6H PRN Andris Baumann, MD      . polyethylene glycol (MIRALAX / GLYCOLAX) packet 17 g  17 g Oral Daily Lolita Patella B, MD   17 g at 04/02/20 0947  . QUEtiapine (SEROQUEL) tablet 100 mg  100 mg Oral QHS Darlin Priestly, MD   100 mg at 04/01/20 1936  . senna-docusate (Senokot-S) tablet 1 tablet  1 tablet Oral BID Lolita Patella B, MD   1 tablet at 04/02/20 0948  . zinc sulfate capsule 220 mg  220 mg Oral Daily Andris Baumann, MD   220 mg at 04/02/20 1950     Discharge Medications: Please see discharge summary for a list of discharge medications.  Relevant Imaging Results:  Relevant Lab Results:   Additional Information SS: [id number]  Maud Deed, LCSW

## 2020-04-02 NOTE — TOC Progression Note (Signed)
Transition of Care [facility]) - Progression Note    Patient Details  Name: Maher Shon MRN: [id number] Date of Birth: Dec 05, 1929  Transition of [facility]) CM/SW Contact  Maud Deed, LCSW Phone Number: 04/02/2020, 10:59 AM  Clinical Narrative:    Clovis Cao, PASRR complete and sent out to facilities, preference is [facility]. Insurance auth started with most recent threapy notes, will upload new notes once available.   Expected Discharge Plan: Skilled Nursing Facility Barriers to Discharge: Continued Medical Work up  Expected Discharge Plan and Services Expected Discharge Plan: Skilled Nursing Facility       Living arrangements for the past 2 months: Single Family Home                                       Social Determinants of Health (SDOH) Interventions    Readmission Risk Interventions No flowsheet data found.

## 2020-04-03 DIAGNOSIS — G9341 Metabolic encephalopathy: Secondary | ICD-10-CM | POA: Diagnosis not present

## 2020-04-03 LAB — CBC WITH DIFFERENTIAL/PLATELET
Abs Immature Granulocytes: 0.21 10*3/uL — ABNORMAL HIGH (ref 0.00–0.07)
Basophils Absolute: 0 10*3/uL (ref 0.0–0.1)
Basophils Relative: 0 %
Eosinophils Absolute: 0 10*3/uL (ref 0.0–0.5)
Eosinophils Relative: 0 %
HCT: 45 % (ref 39.0–52.0)
Hemoglobin: 15.3 g/dL (ref 13.0–17.0)
Immature Granulocytes: 1 %
Lymphocytes Relative: 7 %
Lymphs Abs: 1.4 10*3/uL (ref 0.7–4.0)
MCH: 30.1 pg (ref 26.0–34.0)
MCHC: 34 g/dL (ref 30.0–36.0)
MCV: 88.4 fL (ref 80.0–100.0)
Monocytes Absolute: 1.1 10*3/uL — ABNORMAL HIGH (ref 0.1–1.0)
Monocytes Relative: 6 %
Neutro Abs: 16.9 10*3/uL — ABNORMAL HIGH (ref 1.7–7.7)
Neutrophils Relative %: 86 %
Platelets: 288 10*3/uL (ref 150–400)
RBC: 5.09 MIL/uL (ref 4.22–5.81)
RDW: 13.9 % (ref 11.5–15.5)
WBC: 19.7 10*3/uL — ABNORMAL HIGH (ref 4.0–10.5)
nRBC: 0 % (ref 0.0–0.2)

## 2020-04-03 LAB — BASIC METABOLIC PANEL
Anion gap: 9 (ref 5–15)
BUN: 32 mg/dL — ABNORMAL HIGH (ref 8–23)
CO2: 29 mmol/L (ref 22–32)
Calcium: 8.8 mg/dL — ABNORMAL LOW (ref 8.9–10.3)
Chloride: 99 mmol/L (ref 98–111)
Creatinine, Ser: 0.9 mg/dL (ref 0.61–1.24)
GFR, Estimated: >60 mL/min (ref >60)
Glucose, Bld: 97 mg/dL (ref 70–99)
Potassium: 4.8 mmol/L (ref 3.5–5.1)
Sodium: 137 mmol/L (ref 135–145)

## 2020-04-03 LAB — PHOSPHORUS: Phosphorus: 2.8 mg/dL (ref 2.5–4.6)

## 2020-04-03 LAB — MAGNESIUM: Magnesium: 2.2 mg/dL (ref 1.7–2.4)

## 2020-04-03 NOTE — Progress Notes (Signed)
Calorie Count Note  48 hour calorie count ordered. Results available for 72 hours.  Diet: Regular Supplements: Ensure Enlive/Ensure Plus po TID between meals; Magic Cup po TID with meals  Lunch 12/3: 283.5 kcal, 14.5 grams of protein (75% tilapia, 75% mac and cheese, 75% broccoli, 50% unsweet tea) Dinner 12/3: 617 kcal, 26 grams of protein (50% meatloaf with gravy, 50% baked potato with sour cream, 50% green beans, 100% 2% milk, 100% Magic Cup) Breakfast 12/4: 621 kcal, 18 grams of protein (75% of meal per RN note in chart - no other specifics available) Supplements: 700, 32 grams of protein (Ensure Plus x 2)  Total intake Day 1: 2221.5 kcal (>100% of estimated needs)  90.5 grams of protein (95% of minimum estimated needs)  Lunch 12/4: 605 kcal, 25 grams of protein (75% of meal per documentation in chart - no other specifics available) Dinner 12/4: 380 kcal, 12 grams of protein (40% of meal per RN note in chart - no other specifics) Breakfast 12/5: 975.5 kcal, 14 grams of protein (90% oatmeal, 90% eggs, 90% breakfast potatoes, 90% English muffin, 90% bacon, 100% banana, 50% 2% milk, 100% Magic Cup) Supplements: 700 kcal, 32 grams of protein (Ensure Plus x 2)  Total intake Day 2: 2660.5 kcal (>100% of estimated needs)  83 grams of protein (87% of minimum estimated needs)  Lunch 12/5: 399.5 kcal, 16 grams of protein (25% chicken noodle soup, 25% pot roast with gravy, 25% potato, 100% Magic Cup) Dinner 12/5: 363 kcal, 18 grams of protein (50% roast [place] with gravy, 50% mashed potatoes, 50% green peas, 75% Magic Cup) Breakfast 12/6: 528 kcal, 14 grams of protein (95% pancakes, 95% sausage, 50% 2% milk, 50% coffee with cream and sugar, 50% cranberry juice) Supplements: 1050 kcal, 48 grams of protein (Ensure Plus x 3)  Total intake Day 3: 2340.5 kcal (>100% of estimated needs)  96 grams of protein (101% of minimum estimated needs)  Estimated Nutritional Needs:  Kcal:   1800-2000 Protein:  95-105 grams Fluid:  1.6-1.8 L/day  Nutrition Dx: Severe Malnutrition related to social / environmental circumstances (suspected inadequate oral intake, suspected underlying cognitive dysfunction and dementia) as evidenced by severe fat depletion, moderate muscle depletion, severe muscle depletion.  Goal: Patient will meet greater than or equal to 90% of their needs  Intervention:  -Continue Ensure Enlive po TID, each supplement provides 350 kcal and 20 grams of protein. Patient prefers chocolate. -Continue Magic cup TID with meals, each supplement provides 290 kcal and 9 grams of protein.  Jacklynn Barnacle, MS, RD, LDN Pager number available on Amion

## 2020-04-03 NOTE — Plan of Care (Signed)
  Problem: Education: Goal: Knowledge of risk factors and measures for prevention of condition will improve Outcome: Progressing   Problem: Coping: Goal: Psychosocial and spiritual needs will be supported Outcome: Progressing   Problem: Respiratory: Goal: Will maintain a patent airway Outcome: Progressing Goal: Complications related to the disease process, condition or treatment will be avoided or minimized Outcome: Progressing   

## 2020-04-03 NOTE — Progress Notes (Signed)
PROGRESS NOTE    Dan Scearce  GEZ:[id number] DOB: 11/16/1929 DOA: 03/24/2020 PCP: Pcp, No    Brief Narrative:  [name] a 86 y.o.malewithno significant past medical history and who has not seen a doctor in 8 years and who lives independently but whose son checks in on him twice a day, who was brought in under involuntary commitment due to concerns for confusion, lethargy and not eating. Most of the history is taken from his son who states that patient was in his usual state of health until the past 2 weeks when he appeared more confused than his baseline.  At baseline he is independent in his ADLs but has been noted to have some memory problems over the past 5 months.    12/1: Patient is more alert and awake today.  Ate 75% of breakfast.  Requesting coffee.  Much more oriented to time place and person today.  12/2: Patient remains hemodynamically stable.  Has had some issues with sundowning and nocturnal delirium.  Received Haldol 100 this morning.  Sleepy on my evaluation.  12/3: Patient remains on room air and hemodynamically stable.  P.o. intake remains poor.  Registered dietitian recommending initiation of tube feeds given severe malnutrition.  Per palliative care this is in line with the family's desires as well.  12/4: Calorie count initiated yesterday.  Patient remains on room air, hemodynamically stable.  12/6: Calorie count should be complete today. Patient has been impulsive at night. Mains hemodynamically stable.   Assessment & Plan:   Principal Problem:   Acute metabolic encephalopathy Active Problems:   Pneumonia due to COVID-19 virus   AKI (acute kidney injury) (HCC)   Elevated troponin   Rapid atrial fibrillation, new onset (HCC)   Inadequate oral nutritional intake   Cognitive dysfunction   Unintentional weight loss   Elevated blood-pressure reading without prior diagnosis of hypertension   Protein-calorie malnutrition, severe  Inadequate oral  nutritional intake Unintentional weight loss Severe malnutrition  -Son reports weight loss of 50 pounds over the past 5 months with decreased oral intake over the past 2 weeks --liberalize diet to regular --Ensure TID --Palliative medicine discussed with family.  They are desiring tube feeds should the clinical situation dictate --P.o. intake has actually been improving with frequent encouragement Plan: 48-hour calorie count to complete today. RD to follow-up Continue to encourage patient to eat as much as possible.  This has been effective Reconsider tube feed placement on 04/03/2020 after evaluation by RD and palliative care Continue nightly Remeron  AMS 2/2 COVID encephalopathy Suspect underlying cognitive dysfunction and dementia -Patient presents with 2-week history of confusion and paranoid ideation, lethargy, with 20-monthhistory of cognitive dysfunction. Independent in ADLs at baseline. -Likely multifactorial related to Covid, dehydration -Seems to be improved over interval -Some sundowning/hospital-acquired delirium noted.  Could also be steroid effect PLAN: -Treat acute etiologies, outlined below -Fall and aspiration precautions --Seroquel 100 mg nightly for delirium and agitation -Haldol PRN for agitation --Palliative medicine to follow-up  Pneumonia due to COVID-19 virus -Patient with 5-day history of cough but no shortness of breath or fever and not hypoxic -Covid PCR positive with chest x-ray showing multifocal airspace opacities --completed Remdesivir. PLAN: --Continue oral Decadron, last day today, day 10/10 -albuterol, antitussives and vitamins  Acute PE, POA --D-dimer >7500 on presentation.  CTA chest positive for PE. --Patient has been stable on room air nontachycardic PLAN: --cont Eliquis  AK[place]) ([place] -Creatinine of 1.55 with BUN 62, suspect prerenal related to  decreased oral intake over the past 2 weeks related to his acute  medical condition.  Cr improved with IVF. PLAN: --Hold further IVF and encourage oral hydration.  Rapid atrial fibrillation, new onset ([place]) -EKG with rapid A. fib rate 121 -CHA2DS2-VASc score of 3 based on age of 79 and suspect underlying undiagnosed hypertension --rate controlled with Metoprolol 25 mg twice daily PLAN: --cont metop 25 mg BID --cont Eliquis (started for PE)  Elevated troponin 2/2 demand ischemia -Troponin 40 and flat.  Suspect supply demand mismatch related to rapid A. fib and Covid pneumonia  Elevated blood-pressure reading without prior diagnosis of hypertension Suspect essential hypertension -BP 167/98 on arrival and remaining elevated -Suspect undiagnosed hypertension. Patient has not seen a physician in 8 years PLAN: --cont metop 25 mg BID   DVT prophylaxis: Eliquis Code Status: Full Family Communication: Son Jefferie Holston via phone [phone] on 04/02/2020 Disposition Plan: Status is: Inpatient  Remains inpatient appropriate because:Inpatient level of care appropriate due to severity of illness   Dispo: The patient is from: Home              Anticipated d/c is to: SNF              Anticipated d/c date is: 1 day              Patient currently is not medically stable to d/c.   Pending follow-up from palliative care and registered dietitian regarding nutritional status and possible need for NG tube placement and initiation of tube feeds. Disposition plan pending.  Consultants:   Palliative care  Procedures:   None  Antimicrobials:  None   Subjective: Patient seen and examined. Some agitation and impulsive behavior noted overnight. Stable this morning.  Objective: Vitals:   04/03/20 0038 04/03/20 0405 04/03/20 0653 04/03/20 1031  BP: (!) 149/69 (!) 175/97 (!) 116/50 (!) 118/50  Pulse: 77 81 73 75  Resp: _0 Temp: 98 F (36.7 C) 98 F (36.7 C) 97.9 F (36.6 C)   TempSrc:   Oral   SpO2: 98% 98% 98%   Weight:       Height:        Intake/Output Summary (Last 24 hours) at 04/03/2020 1103 Last data filed at 04/03/2020 0816 Gross per 24 hour  Intake 361 ml  Output --  Net 361 ml   Filed Weights   03/24/20 1646 03/25/20 2049 03/28/20 1046  Weight: 79.4 kg 65.5 kg 65.6 kg    Examination:  General exam: No acute distress.  Appears frail Respiratory system: Poor respiratory effort.  Lungs overall clear.  Room air Cardiovascular system: S1 & S2 heard, RRR. No JVD, murmurs, rubs, gallops or clicks. No pedal edema. Gastrointestinal system: Abdomen is nondistended, soft and nontender. No organomegaly or masses felt. Normal bowel sounds heard. Central nervous system: Sleepy, oriented to person and place.  No focal deficits Extremities: Symmetric 5 x 5 power. Skin: No rashes, lesions or ulcers Psychiatry: Judgement and insight appear impaired. Mood & affect flattened.     Data Reviewed: I have personally reviewed following labs and imaging studies  CBC: Recent Labs  Lab 03/29/20 0631 03/30/20 0445 03/31/20 0526 04/02/20 0553 04/03/20 0535  WBC 11.6* 12.1* 15.0* 15.3* 19.7*  NEUTROABS  --   --   --  13.6* 16.9*  HGB 17.2* 14.8 16.4 13.8 15.3  HCT 52.6* 44.5 47.8 41.2 45.0  MCV 89.9 89.7 88.4 89.2 88.4  PLT 268 212 253 238 288  Basic Metabolic Panel: Recent Labs  Lab 03/28/20 0757 03/28/20 0757 03/29/20 0631 03/30/20 0445 03/31/20 0526 04/02/20 0553 04/03/20 0535  NA 140   < > 144 139 138 137 137  K 4.1   < > 4.2 4.2 4.3 4.9 4.8  CL 104   < > 101 101 98 99 99  CO2 26   < > 33* _0 GLUCOSE 100*   < > 103* 109* 86 98 97  BUN 34*   < > 32* 33* 27* 34* 32*  CREATININE 0.88   < > 1.09 0.87 0.78 0.89 0.90  CALCIUM 8.5*   < > 8.8* 8.4* 8.6* 8.7* 8.8*  MG 2.6*  --  2.6*  --   --  2.2 2.2  PHOS  --   --   --   --   --  2.0* 2.8   < > = values in this interval not displayed.   GFR: Estimated Creatinine Clearance: 51.2 mL/min (by C-G formula based on SCr of 0.9 mg/dL). Liver  Function Tests: No results for input(s): AST, ALT, ALKPHOS, BILITOT, PROT, ALBUMIN in the last 168 hours. No results for input(s): LIPASE, AMYLASE in the last 168 hours. No results for input(s): AMMONIA in the last 168 hours. Coagulation Profile: No results for input(s): INR, PROTIME in the last 168 hours. Cardiac Enzymes: No results for input(s): CKTOTAL, CKMB, CKMBINDEX, TROPONINI in the last 168 hours. BNP (last 3 results) No results for input(s): PROBNP in the last 8760 hours. HbA1C: No results for input(s): HGBA1C in the last 72 hours. CBG: No results for input(s): GLUCAP in the last 168 hours. Lipid Profile: No results for input(s): CHOL, HDL, LDLCALC, TRIG, CHOLHDL, LDLDIRECT in the last 72 hours. Thyroid Function Tests: No results for input(s): TSH, T4TOTAL, FREET4, T3FREE, THYROIDAB in the last 72 hours. Anemia Panel: No results for input(s): VITAMINB12, FOLATE, FERRITIN, TIBC, IRON, RETICCTPCT in the last 72 hours. Sepsis Labs: No results for input(s): PROCALCITON, LATICACIDVEN in the last 168 hours.  Recent Results (from the past 240 hour(s))  Resp Panel by RT-PCR (Flu A&B, Covid) Nasopharyngeal Swab     Status: Abnormal   Collection Time: 03/24/20  7:17 PM   Specimen: Nasopharyngeal Swab; Nasopharyngeal(NP) swabs in vial transport medium  Result Value Ref Range Status   SARS Coronavirus 2 by RT PCR POSITIVE (A) NEGATIVE Final    Comment: RESULT CALLED TO, READ BACK BY AND VERIFIED WITH: MAC BROWN,RN 2101 03/24/2020 DB (NOTE) SARS-CoV-2 target nucleic acids are DETECTED.  The SARS-CoV-2 RNA is generally detectable in upper respiratory specimens during the acute phase of infection. Positive results are indicative of the presence of the identified virus, but do not rule out bacterial infection or co-infection with other pathogens not detected by the test. Clinical correlation with patient history and other diagnostic information is necessary to determine  patient infection status. The expected result is Negative.  Fact Sheet for Patients: EntrepreneurPulse.com.au  Fact Sheet for Healthcare Providers: IncredibleEmployment.be  This test is not yet approved or cleared by the [place] FDA and  has been authorized for detection and/or diagnosis of SARS-CoV-2 by FDA under an Emergency Use Authorization (EUA).  This EUA will remain in effect (meaning this test can be u sed) for the duration of  the COVID-19 declaration under Section 564(b)(1) of the Act, 21 U.S.C. section 360bbb-3(b)(1), unless the authorization is terminated or revoked sooner.     Influenza A by PCR NEGATIVE NEGATIVE Final   Influenza  B by PCR NEGATIVE NEGATIVE Final    Comment: (NOTE) The Xpert Xpress SARS-CoV-2/FLU/RSV plus assay is intended as an aid in the diagnosis of influenza from Nasopharyngeal swab specimens and should not be used as a sole basis for treatment. Nasal washings and aspirates are unacceptable for Xpert Xpress SARS-CoV-2/FLU/RSV testing.  Fact Sheet for Patients: EntrepreneurPulse.com.au  Fact Sheet for Healthcare Providers: IncredibleEmployment.be  This test is not yet approved or cleared by the [place] FDA and has been authorized for detection and/or diagnosis of SARS-CoV-2 by FDA under an Emergency Use Authorization (EUA). This EUA will remain in effect (meaning this test can be used) for the duration of the COVID-19 declaration under Section 564(b)(1) of the Act, 21 U.S.C. section 360bbb-3(b)(1), unless the authorization is terminated or revoked.  Performed at [facility], [address], [place], [place] [address]          Radiology Studies: DG Abd 1 View  Result Date: 04/01/2020 CLINICAL DATA:  COVID-19 positive on 11/26.  Constipation. EXAM: ABDOMEN - 1 VIEW COMPARISON:  None. FINDINGS: Mild to moderate increase in colonic and rectal  stool burden. No bowel dilation to suggest obstruction. No evidence of renal or ureteral stones. Scattered arterial atherosclerotic calcifications. Soft tissues otherwise unremarkable. Degenerative changes of the lumbar spine. No acute skeletal abnormality. IMPRESSION: 1. No acute findings.  No bowel obstruction. 2. Mild to moderate increase in colonic and rectal stool. Electronically Signed   By: Lajean Manes M.D.   On: 04/01/2020 13:48        Scheduled Meds: . albuterol  2 puff Inhalation Q6H  . amLODipine  5 mg Oral Daily  . apixaban  5 mg Oral BID  . vitamin C  500 mg Oral Daily  . dexamethasone  6 mg Oral Daily  . feeding supplement  237 mL Oral TID BM  . metoprolol tartrate  12.5 mg Oral BID  . mirtazapine  15 mg Oral QHS  . polyethylene glycol  17 g Oral Daily  . QUEtiapine  100 mg Oral QHS  . senna-docusate  1 tablet Oral BID  . zinc sulfate  220 mg Oral Daily   Continuous Infusions:   LOS: 10 days    Time spent: 15 minutes    Sidney Ace, MD [facility] Pager [phone]  If 7PM-7AM, please contact night-coverage 04/03/2020, 11:03 AM

## 2020-04-03 NOTE — Progress Notes (Signed)
Per MD Sreenath, d/c decadron, pt has finished course.

## 2020-04-03 NOTE — Progress Notes (Signed)
PT Cancellation Note  Patient Details Name: Darryl Haley MRN: [id number] DOB: 1929-11-21   Cancelled Treatment:    Reason Eval/Treat Not Completed: Patient declined, no reason specified Pt laying in bed on arrival, states he has been up multiple times today and that he just finished eating and does not want to do any activity.  Offered varying levels of activity including supine exercises, balance activities, in room ambulation but he flatly refuses.  RN prior to entering room, and sitter in room confirm that he has been moving around relatively well and not needing a lot of direct assist.  Will maintain on PT schedule and attempt to see as appropriate.   Malachi Pro, DPT 04/03/2020, 3:42 PM

## 2020-04-03 NOTE — Progress Notes (Signed)
Nutrition Follow-up  DOCUMENTATION CODES:   Severe malnutrition in context of social or environmental circumstances  INTERVENTION:  48 hour calorie count now complete. Patient with improved PO intake and RD no longer recommending placement of feeding tube.  Continue Ensure Enlive po TID, each supplement provides 350 kcal and 20 grams of protein. Patient prefers chocolate.  Continue Magic cup TID with meals, each supplement provides 290 kcal and 9 grams of protein.  NUTRITION DIAGNOSIS:   Severe Malnutrition related to social / environmental circumstances (suspected inadequate oral intake, suspected underlying cognitive dysfunction and dementia) as evidenced by severe fat depletion, moderate muscle depletion, severe muscle depletion.  Ongoing.  GOAL:   Patient will meet greater than or equal to 90% of their needs  Met.  MONITOR:   PO intake, Supplement acceptance, Labs, Weight trends, I & O's  REASON FOR ASSESSMENT:   Malnutrition Screening Tool    ASSESSMENT:   86 year old male with no significant PMHx admitted with COVID-19 PNA, AMS suspected due to COVID encephalopathy, suspected underlying cognitive dysfunction and dementia, acute PE, AKI, rapid A-fib.  Met with patient at bedside. Safety sitter present in room. Patient much more alert now. He was somewhat confused but in good spirits and joking with RD. He is eating much better now. Calorie count completed over the weekend (previous note). Patient meeting >100% kcal needs and 87-101% minimum estimated protein needs. RD no longer recommending placement of feeding tube as PO intake has improved. Per sitter patient is easily distracted at meal times and needs redirection but is able to feed himself.  Medications reviewed and include: vitamin C 500 mg daily, Decadron, Remeron 15 mg QHS, Miralax 17 grams daily, senna-docusate 1 tablet BID, zinc sulfate 220 mg daily.  Labs reviewed: BUN 32. Potassium, Phosphorus, and Magnesium  now WNL.  Sent MD secure chat message regarding calorie count findings and updated recommendations.  Diet Order:   Diet Order            Diet regular Room service appropriate? Yes; Fluid consistency: Thin  Diet effective now                EDUCATION NEEDS:   No education needs have been identified at this time  Skin:  Skin Assessment: Reviewed RN Assessment  Last BM:  Unknown  Height:   Ht Readings from Last 1 Encounters:  03/25/20 5' 6" (1.676 m)   Weight:   Wt Readings from Last 1 Encounters:  03/28/20 65.6 kg   Ideal Body Weight:  64.5 kg  BMI:  Body mass index is 23.34 kg/m.  Estimated Nutritional Needs:   Kcal:  1800-2000  Protein:  95-105 grams  Fluid:  1.6-1.8 L/day  Jacklynn Barnacle, MS, RD, LDN Pager number available on Amion

## 2020-04-03 NOTE — Progress Notes (Signed)
OT Cancellation Note  Patient Details Name: Darryl Haley MRN: [id number] DOB: 1929-08-29   Cancelled Treatment:    Reason Eval/Treat Not Completed: Patient declined, no reason specified. Pt declining therapy this date, despite encouragement. Will re-attempt as appropriate.   Richrd Prime, MPH, MS, OTR/L ascom [phone] 04/03/20, 4:00 PM

## 2020-04-04 DIAGNOSIS — G9341 Metabolic encephalopathy: Secondary | ICD-10-CM | POA: Diagnosis not present

## 2020-04-04 LAB — BASIC METABOLIC PANEL
Anion gap: 9 (ref 5–15)
BUN: 32 mg/dL — ABNORMAL HIGH (ref 8–23)
CO2: 29 mmol/L (ref 22–32)
Calcium: 8.6 mg/dL — ABNORMAL LOW (ref 8.9–10.3)
Chloride: 102 mmol/L (ref 98–111)
Creatinine, Ser: 0.77 mg/dL (ref 0.61–1.24)
GFR, Estimated: >60 mL/min (ref >60)
Glucose, Bld: 88 mg/dL (ref 70–99)
Potassium: 4.4 mmol/L (ref 3.5–5.1)
Sodium: 140 mmol/L (ref 135–145)

## 2020-04-04 LAB — CBC WITH DIFFERENTIAL/PLATELET
Abs Immature Granulocytes: 0.19 10*3/uL — ABNORMAL HIGH (ref 0.00–0.07)
Basophils Absolute: 0 10*3/uL (ref 0.0–0.1)
Basophils Relative: 0 %
Eosinophils Absolute: 0 10*3/uL (ref 0.0–0.5)
Eosinophils Relative: 0 %
HCT: 40.1 % (ref 39.0–52.0)
Hemoglobin: 13.9 g/dL (ref 13.0–17.0)
Immature Granulocytes: 1 %
Lymphocytes Relative: 8 %
Lymphs Abs: 1.2 10*3/uL (ref 0.7–4.0)
MCH: 30.5 pg (ref 26.0–34.0)
MCHC: 34.7 g/dL (ref 30.0–36.0)
MCV: 88.1 fL (ref 80.0–100.0)
Monocytes Absolute: 1 10*3/uL (ref 0.1–1.0)
Monocytes Relative: 6 %
Neutro Abs: 13.1 10*3/uL — ABNORMAL HIGH (ref 1.7–7.7)
Neutrophils Relative %: 85 %
Platelets: 212 10*3/uL (ref 150–400)
RBC: 4.55 MIL/uL (ref 4.22–5.81)
RDW: 14 % (ref 11.5–15.5)
WBC: 15.5 10*3/uL — ABNORMAL HIGH (ref 4.0–10.5)
nRBC: 0 % (ref 0.0–0.2)

## 2020-04-04 LAB — MAGNESIUM: Magnesium: 2.2 mg/dL (ref 1.7–2.4)

## 2020-04-04 LAB — PHOSPHORUS: Phosphorus: 2.9 mg/dL (ref 2.5–4.6)

## 2020-04-04 MED ORDER — MIRTAZAPINE 15 MG PO TABS: 15.0000 mg | ORAL_TABLET | Freq: Every day | ORAL | 0 refills | Status: AC

## 2020-04-04 MED ORDER — METOPROLOL TARTRATE 25 MG PO TABS: 12.5000 mg | ORAL_TABLET | Freq: Two times a day (BID) | ORAL | 0 refills | Status: DC

## 2020-04-04 MED ORDER — APIXABAN 5 MG PO TABS
5.0000 mg | ORAL_TABLET | Freq: Two times a day (BID) | ORAL | 3 refills | Status: DC
Start: 2020-04-04 — End: 2022-12-31

## 2020-04-04 MED ORDER — ALBUTEROL SULFATE HFA 108 (90 BASE) MCG/ACT IN AERS: 2.0000 | INHALATION_SPRAY | Freq: Four times a day (QID) | RESPIRATORY_TRACT | 0 refills | Status: DC | PRN

## 2020-04-04 NOTE — TOC Transition Note (Signed)
Transition of Care [facility]) - CM/SW Discharge Note   Patient Details  Name: Darryl Haley MRN: [id number] Date of Birth: [birth year]  [facility] CM/SW Contact:  Eilleen Kempf, LCSW Phone Number: 04/04/2020, 1:22 PM   Clinical Narrative:    CSW received email this morning stating patient was denied by [facility] for SNF. CSW called son, [name] and provided information for an appeal. PT saw patient again after breakfast and changed recommendations to HH/PT. Patient is going to stay with his [name] @ [address] [place], [place] and [place] will provide HH/PT services for patient there. Son [name], is transporting patient home. CSW provided coupon card for Eliquis and information for PCP appointment for follow-up.      Barriers to Discharge: Continued Medical Work up   Patient Goals and CMS Choice Patient states their goals for this hospitalization and ongoing recovery are:: SNF rehab CMS Medicare.gov Compare Post Acute Care list provided to:: Patient Represenative (must comment) Choice offered to / list presented to : Adult Children  Discharge Placement                       Discharge Plan and Services                                     Social Determinants of Health (SDOH) Interventions     Readmission Risk Interventions No flowsheet data found.

## 2020-04-04 NOTE — Discharge Summary (Signed)
Physician Discharge Summary  Darryl Haley [name] DOB: 05-10-1929 DOA: 03/24/2020  PCP: Darryl Haley, No  Admit date: 03/24/2020 Discharge date: 04/04/2020  Admitted From: Home Disposition:  Home with home health  Recommendations for Outpatient Follow-up:  1. Follow up with PCP in 1-2 weeks 2.   Home Health:Yes Equipment/Devices:None  Discharge Condition:Stable CODE STATUS:Full Diet recommendation: Regular  Brief/Interim Summary: Darryl Whiteis a 86 y.o.malewithno significant past medical history and who has not seen a doctor in 8 years and who lives independently but whose son checks in on him twice a day, who was brought in under involuntary commitment due to concerns for confusion, lethargy and not eating. Most of the history is taken from his son who states that patient was in his usual state of health until the past 2 weeks when he appeared more confused than his baseline. At baseline he is independent in his ADLs but has been noted to have some memory problems over the past 5 months.   12/1: Patient is more alert and awake today.  Ate 75% of breakfast.  Requesting coffee.  Much more oriented to time place and person today.  12/2: Patient remains hemodynamically stable.  Has had some issues with sundowning and nocturnal delirium.  Received Haldol 100 this morning.  Sleepy on my evaluation.  12/3: Patient remains on room air and hemodynamically stable.  P.o. intake remains poor.  Registered dietitian recommending initiation of tube feeds given severe malnutrition.  Per palliative care this is in line with the family's desires as well.  12/4: Calorie count initiated yesterday.  Patient remains on room air, hemodynamically stable.  12/6: Calorie count should be complete today. Patient has been impulsive at night. Mains hemodynamically stable.  12/7: Patient seen and examined.  Ambulated with physical therapy.  Did very well.  No longer requires skilled nursing placement.   Communicated with registered dietitian.  Over weekend patient did very well with oral intake.  Meeting nearly 100% of his nutritional goals.  No indication for enteral feeding at this time.  Patient stable for discharge home with home health.  Had a long conversation with the son about the patient's diagnoses and recommendations for medications and need for primary care follow-up.  TOC will attempt to set up a primary care visit.  Discharge Diagnoses:  Principal Problem:   Acute metabolic encephalopathy Active Problems:   Pneumonia due to COVID-19 virus   AKI (acute kidney injury) (HCC)   Elevated troponin   Rapid atrial fibrillation, new onset (HCC)   Inadequate oral nutritional intake   Cognitive dysfunction   Unintentional weight loss   Elevated blood-pressure reading without prior diagnosis of hypertension   Protein-calorie malnutrition, severe  Inadequate oral nutritional intake Unintentional weight loss Severe malnutrition  -Son reports weight loss of 50 pounds over the past 5 months with decreased oral intake over the past 2 weeks --liberalize diet to regular --Ensure TID --Palliative medicine discussed with family.  They are desiring tube feeds should the clinical situation dictate --P.o. intake has actually been improving with frequent encouragement --Patient completed 48-hour calorie count --P.o. intake substantially improved --At time of discharge patient meeting nearly 100% of his nutritional goals --No indication for enteral feeds --Discharged on Remeron 15 mg nightly  AMS 2/2 COVID encephalopathy Suspect underlying cognitive dysfunction and dementia -Patient presents with 2-week history of confusion and paranoid ideation, lethargy, with 70-month history of cognitive dysfunction. Independent in ADLs at baseline. -Likely multifactorial related to Covid, dehydration -Seems to be improved over interval -Some  sundowning/hospital-acquired delirium noted.  Could also be  steroid effect -Completed steroid course in house -Appears calm and adherent to care at time of discharge -DC nightly Seroquel  Pneumonia due to COVID-19 virus -Patient with 5-day history of cough but no shortness of breath or fever and not hypoxic -Covid PCR positive with chest x-ray showing multifocal airspace opacities --completed Remdesivir. PLAN: Completed oral Decadron As needed albuterol MDI prescribed on discharge  Acute PE, POA --D-dimer >7500 on presentation. CTA chest positive for PE. --Patient has been stable on room air nontachycardic PLAN: --cont Eliquis --60-month supply prescribed on discharge  AKI (acute kidney injury) (HCC), resolved -Creatinine of 1.55 with BUN 62, suspect prerenal related to decreased oral intake over the past 2 weeks related to his acute medical condition. Cr improved with IVF.   Rapid atrial fibrillation, new onset (HCC) -EKG with rapid A. fib rate 121 -CHA2DS2-VASc score of 3 based on age of 86 and suspect underlying undiagnosed hypertension --Rate controlled on metoprolol 12.5 mg twice daily.  Discharged on this dose --Eliquis as above   Elevated troponin 2/2 demand ischemia -Troponin 40 and flat. Suspect supply demand mismatch related to rapid A. fib and Covid pneumonia  Elevated blood-pressure reading without prior diagnosis of hypertension Suspect essential hypertension -BP 167/98 on arrival and remaining elevated -Suspect undiagnosed hypertension. Patient has not seen a physician in 8 years PLAN: --cont metop 25 mg BID  Discharge Instructions  Discharge Instructions    Amb referral to AFIB Clinic   Complete by: As directed    Diet - low sodium heart healthy   Complete by: As directed    Increase activity slowly   Complete by: As directed      Allergies as of 04/04/2020   Not on File     Medication List    TAKE these medications   albuterol 108 (90 Base) MCG/ACT inhaler Commonly known as: VENTOLIN  HFA Inhale 2 puffs into the lungs every 6 (six) hours as needed for wheezing or shortness of breath.   apixaban 5 MG Tabs tablet Commonly known as: ELIQUIS Take 1 tablet (5 mg total) by mouth 2 (two) times daily.   metoprolol tartrate 25 MG tablet Commonly known as: LOPRESSOR Take 0.5 tablets (12.5 mg total) by mouth 2 (two) times daily.   mirtazapine 15 MG tablet Commonly known as: REMERON Take 1 tablet (15 mg total) by mouth at bedtime.       Not on File  Consultations:  Palliative care   Procedures/Studies: DG Abd 1 View  Result Date: 04/01/2020 CLINICAL DATA:  COVID-19 positive on 11/26.  Constipation. EXAM: ABDOMEN - 1 VIEW COMPARISON:  None. FINDINGS: Mild to moderate increase in colonic and rectal stool burden. No bowel dilation to suggest obstruction. No evidence of renal or ureteral stones. Scattered arterial atherosclerotic calcifications. Soft tissues otherwise unremarkable. Degenerative changes of the lumbar spine. No acute skeletal abnormality. IMPRESSION: 1. No acute findings.  No bowel obstruction. 2. Mild to moderate increase in colonic and rectal stool. Electronically Signed   By: Amie Portland M.D.   On: 04/01/2020 13:48   CT Head Wo Contrast  Result Date: 03/24/2020 CLINICAL DATA:  86 year old male with altered mental status. EXAM: CT HEAD WITHOUT CONTRAST TECHNIQUE: Contiguous axial images were obtained from the base of the skull through the vertex without intravenous contrast. COMPARISON:  None. FINDINGS: Brain: Mild age-related atrophy and chronic microvascular ischemic changes. There is no acute intracranial hemorrhage. No mass effect or midline shift. No extra-axial fluid  collection. Vascular: No hyperdense vessel or unexpected calcification. Skull: Normal. Negative for fracture or focal lesion. Sinuses/Orbits: No acute finding. Other: None IMPRESSION: 1. No acute intracranial pathology. 2. Mild age-related atrophy and chronic microvascular ischemic changes.  Electronically Signed   By: Elgie Collard M.D.   On: 03/24/2020 19:27   CT ANGIO CHEST PE W OR WO CONTRAST  Result Date: 03/25/2020 CLINICAL DATA:  COVID positive patient. Shortness of breath, weakness and confusion. EXAM: CT ANGIOGRAPHY CHEST WITH CONTRAST TECHNIQUE: Multidetector CT imaging of the chest was performed using the standard protocol during bolus administration of intravenous contrast. Multiplanar CT image reconstructions and MIPs were obtained to evaluate the vascular anatomy. CONTRAST:  75mL OMNIPAQUE IOHEXOL 350 MG/ML SOLN COMPARISON:  None. FINDINGS: Cardiovascular: Mild cardiac enlargement. 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 Aortic atherosclerosis. Coronary artery atherosclerotic calcification. The main pulmonary artery appears patent. No obstructing pulmonary emboli. Filling defect within the segmental branch to the lingula is identified, image 161/5. A second filling defect is identified within a segmental branch to the lateral right lung base, image 196/5. Mediastinum/Nodes: No thyroid nodules. Trachea appears patent and midline. Unremarkable appearance of the esophagus. There is no axillary, supraclavicular, mediastinal adenopathy. Lungs/Pleura: No pleural effusion. Bilateral, peripheral and basal predominant ground-glass opacities and consolidation with extensive geographic distribution. Imaging findings consistent with COVID pneumonia. Upper Abdomen: No acute findings Musculoskeletal: Thoracic spondylosis. No acute or suspicious osseous findings. Review of the MIP images confirms the above findings. IMPRESSION: 1. Examination is positive for acute pulmonary emboli within the segmental branches to the lingula and right lateral lung base. 2. Bilateral, peripheral and basal predominant ground-glass opacities and consolidation with extensive geographic distribution. Imaging findings consistent with COVID pneumonia. 3. Aortic  atherosclerosis. Coronary artery calcifications noted. Aortic Atherosclerosis (ICD10-I70.0). Critical Value/emergent results were called by telephone at the time of interpretation on 03/25/2020 at 10:44 am to provider Jene Every, MD, who verbally acknowledged these results. Electronically Signed   By: Signa Kell M.D.   On: 03/25/2020 10:44   DG Chest Portable 1 View  Result Date: 03/24/2020 CLINICAL DATA:  Weakness EXAM: PORTABLE CHEST 1 VIEW COMPARISON:  None. FINDINGS: Patchy mid to lower lung airspace opacities. No pleural effusion. Normal heart size. No pneumothorax. IMPRESSION: Patchy mid to lower lung airspace opacities bilaterally, suspicious for multifocal pneumonia. Electronically Signed   By: Jasmine Pang M.D.   On: 03/24/2020 19:36   ECHOCARDIOGRAM COMPLETE  Result Date: 03/25/2020    ECHOCARDIOGRAM REPORT   Patient Name:   Lanard Landin Date of Exam: 03/25/2020 Medical Rec #:  [id number]   Height:       66.0 in Accession #:    [id number]  Weight:       175.0 lb Date of Birth:  Dec 12, 1929   BSA:          1.889 m Patient Age:    86 years    BP:           156/64 mmHg Patient Gender: M           HR:           81 bpm. Exam Location:  [facility] Procedure: 2D Echo, Cardiac Doppler and Color Doppler Indications:     Atrial Fibrillation 427.31 / I48.91  History:         Patient has no prior history of Echocardiogram examinations.  Risk Factors:Hypertension.  Sonographer:     Neysa Bonito Roar Referring Phys:  [id number] Andris Baumann Diagnosing Phys: Harold Hedge MD IMPRESSIONS  1. Left ventricular ejection fraction, by estimation, is 55 to 60%. The left ventricle has normal function. The left ventricle has no regional wall motion abnormalities. Left ventricular diastolic parameters were normal.  2. Right ventricular systolic function is normal. The right ventricular size is normal.  3. The mitral valve is grossly normal. Trivial mitral valve regurgitation.  4. The aortic valve is grossly  normal. Aortic valve regurgitation is mild. FINDINGS  Left Ventricle: Left ventricular ejection fraction, by estimation, is 55 to 60%. The left ventricle has normal function. The left ventricle has no regional wall motion abnormalities. The left ventricular internal cavity size was normal in size. There is  no left ventricular hypertrophy. Left ventricular diastolic parameters were normal. Right Ventricle: The right ventricular size is normal. No increase in right ventricular wall thickness. Right ventricular systolic function is normal. Left Atrium: Left atrial size was normal in size. Right Atrium: Right atrial size was normal in size. Pericardium: There is no evidence of pericardial effusion. Mitral Valve: The mitral valve is grossly normal. Trivial mitral valve regurgitation. Tricuspid Valve: The tricuspid valve is not well visualized. Tricuspid valve regurgitation is mild. Aortic Valve: The aortic valve is grossly normal. Aortic valve regurgitation is mild. Aortic valve mean gradient measures 4.0 mmHg. Aortic valve peak gradient measures 6.7 mmHg. Aortic valve area, by VTI measures 1.71 cm. Pulmonic Valve: The pulmonic valve was not well visualized. Pulmonic valve regurgitation is trivial. Aorta: The aortic root is normal in size and structure. IAS/Shunts: The interatrial septum was not assessed.  LEFT VENTRICLE PLAX 2D LVIDd:         4.04 cm  Diastology LVIDs:         3.01 cm  LV e' medial:    11.90 cm/s LV PW:         0.97 cm  LV E/e' medial:  8.8 LV IVS:        1.05 cm  LV e' lateral:   12.10 cm/s LVOT diam:     1.80 cm  LV E/e' lateral: 8.7 LV SV:         45 LV SV Index:   24 LVOT Area:     2.54 cm  RIGHT VENTRICLE RV Mid diam:    2.65 cm RV S prime:     13.20 cm/s TAPSE (M-mode): 1.6 cm LEFT ATRIUM             Index       RIGHT ATRIUM           Index LA diam:        3.25 cm 1.72 cm/m  RA Area:     14.30 cm LA Vol (A2C):   36.3 ml 19.21 ml/m RA Volume:   30.70 ml  16.25 ml/m LA Vol (A4C):   48.7 ml  25.77 ml/m LA Biplane Vol: 42.5 ml 22.49 ml/m  AORTIC VALVE                   PULMONIC VALVE AV Area (Vmax):    2.09 cm    PV Vmax:       0.87 m/s AV Area (Vmean):   2.10 cm    PV Peak grad:  3.0 mmHg AV Area (VTI):     1.71 cm AV Vmax:           129.00 cm/s AV Vmean:  95.100 cm/s AV VTI:            0.261 m AV Peak Grad:      6.7 mmHg AV Mean Grad:      4.0 mmHg LVOT Vmax:         106.00 cm/s LVOT Vmean:        78.600 cm/s LVOT VTI:          0.175 m LVOT/AV VTI ratio: 0.67  AORTA Ao Root diam: 3.00 cm MITRAL VALVE                TRICUSPID VALVE MV Area (PHT): 5.23 cm     TR Peak grad:   21.3 mmHg MV Decel Time: 145 msec     TR Vmax:        231.00 cm/s MV E velocity: 105.00 cm/s                             SHUNTS                             Systemic VTI:  0.18 m                             Systemic Diam: 1.80 cm Harold HedgeKenneth Fath MD Electronically signed by Harold HedgeKenneth Fath MD Signature Date/Time: 03/25/2020/1:34:15 PM    Final     (Echo, Carotid, EGD, Colonoscopy, ERCP)    Subjective: Patient seen and examined on day of discharge.  No distress.  Stable for discharge home with home health.  Discharge Exam: Vitals:   04/04/20 0654 04/04/20 1110  BP: (!) 158/76 (!) 111/52  Pulse: 77 68  Resp: 20 18  Temp: 98.3 F (36.8 C) 98 F (36.7 C)  SpO2: 95% 99%   Vitals:   04/03/20 2014 04/03/20 2340 04/04/20 0654 04/04/20 1110  BP: (!) 154/71 (!) 152/68 (!) 158/76 (!) 111/52  Pulse: 79 78 77 68  Resp: 20 20 20 18   Temp: 98.6 F (37 C) 98.3 F (36.8 C) 98.3 F (36.8 C) 98 F (36.7 C)  TempSrc: Oral Oral  Oral  SpO2: 95% 97% 95% 99%  Weight:      Height:        General: Pt is alert, awake, not in acute distress Cardiovascular: RRR, S1/S2 +, no rubs, no gallops Respiratory: CTA bilaterally, no wheezing, no rhonchi Abdominal: Soft, NT, ND, bowel sounds + Extremities: no edema, no cyanosis    The results of significant diagnostics from this hospitalization (including imaging,  microbiology, ancillary and laboratory) are listed below for reference.     Microbiology: No results found for this or any previous visit (from the past 240 hour(s)).   Labs: BNP (last 3 results) No results for input(s): BNP in the last 8760 hours. Basic Metabolic Panel: Recent Labs  Lab 03/29/20 0631 03/29/20 0631 03/30/20 0445 03/31/20 0526 04/02/20 0553 04/03/20 0535 04/04/20 0515  NA 144   < > 139 138 137 137 140  K 4.2   < > 4.2 4.3 4.9 4.8 4.4  CL 101   < > 101 98 99 99 102  CO2 33*   < > 29 31 30 29 29   GLUCOSE 103*   < > 109* 86 98 97 88  BUN 32*   < > 33* 27* 34* 32* 32*  CREATININE 1.09   < >  0.87 0.78 0.89 0.90 0.77  CALCIUM 8.8*   < > 8.4* 8.6* 8.7* 8.8* 8.6*  MG 2.6*  --   --   --  2.2 2.2 2.2  PHOS  --   --   --   --  2.0* 2.8 2.9   < > = values in this interval not displayed.   Liver Function Tests: No results for input(s): AST, ALT, ALKPHOS, BILITOT, PROT, ALBUMIN in the last 168 hours. No results for input(s): LIPASE, AMYLASE in the last 168 hours. No results for input(s): AMMONIA in the last 168 hours. CBC: Recent Labs  Lab 03/30/20 0445 03/31/20 0526 04/02/20 0553 04/03/20 0535 04/04/20 0515  WBC 12.1* 15.0* 15.3* 19.7* 15.5*  NEUTROABS  --   --  13.6* 16.9* 13.1*  HGB 14.8 16.4 13.8 15.3 13.9  HCT 44.5 47.8 41.2 45.0 40.1  MCV 89.7 88.4 89.2 88.4 88.1  PLT 212 253 238 288 212   Cardiac Enzymes: No results for input(s): CKTOTAL, CKMB, CKMBINDEX, TROPONINI in the last 168 hours. BNP: Invalid input(s): POCBNP CBG: No results for input(s): GLUCAP in the last 168 hours. D-Dimer No results for input(s): DDIMER in the last 72 hours. Hgb A1c No results for input(s): HGBA1C in the last 72 hours. Lipid Profile No results for input(s): CHOL, HDL, LDLCALC, TRIG, CHOLHDL, LDLDIRECT in the last 72 hours. Thyroid function studies No results for input(s): TSH, T4TOTAL, T3FREE, THYROIDAB in the last 72 hours.  Invalid input(s): FREET3 Anemia work  up No results for input(s): VITAMINB12, FOLATE, FERRITIN, TIBC, IRON, RETICCTPCT in the last 72 hours. Urinalysis    Component Value Date/Time   COLORURINE YELLOW (A) 03/25/2020 1322   APPEARANCEUR HAZY (A) 03/25/2020 1322   LABSPEC 1.044 (H) 03/25/2020 1322   PHURINE 5.0 03/25/2020 1322   GLUCOSEU NEGATIVE 03/25/2020 1322   HGBUR SMALL (A) 03/25/2020 1322   BILIRUBINUR NEGATIVE 03/25/2020 1322   KETONESUR 5 (A) 03/25/2020 1322   PROTEINUR NEGATIVE 03/25/2020 1322   NITRITE NEGATIVE 03/25/2020 1322   LEUKOCYTESUR NEGATIVE 03/25/2020 1322   Sepsis Labs Invalid input(s): PROCALCITONIN,  WBC,  LACTICIDVEN Microbiology No results found for this or any previous visit (from the past 240 hour(s)).   Time coordinating discharge: Over 30 minutes  SIGNED:   Tresa Moore, MD  [facility] 04/04/2020, 12:15 PM Pager   If 7PM-7AM, please contact night-coverage

## 2020-04-04 NOTE — Progress Notes (Signed)
Attempted to call patients daughter, Kathie Rhodes, to discuss concerns. No answer. Bo Mcclintock, RN

## 2020-04-04 NOTE — Progress Notes (Signed)
Physical Therapy Treatment Patient Details Name: Darryl Haley MRN: [id number] DOB: 1930/01/21 Today's Date: 04/04/2020    History of Present Illness Patient is an 86 yo male that presented to ED with 2-week history of confusion and paranoid ideation, lethargy, with 50-month history of cognitive dysfunction. Admitted for PNA due to covid19. New onset of afib, elevated troponin due to demand ischemia noted in chart as well. work up showed acute PE in R lung (11/27).    PT Comments    Pt was supine in bed upon arriving. He was alert throughout session and pleasantly confused. Disoriented to setting/situation. He has a Comptroller for safety 2/2 to attempting to exit bed independently previous date. Overall, pt is progressing extremely well from a PT standpoint. Cognition deficits are only safety concern. Author reached out to pt's son and discussed new PT recommendations. Acute PT to change recs to home with HHPT to follow. Son states pt is planning to go stay with his sister at [place]. He will contact SW to inform her of pt's sister's address. Physically pt was easily able to exit bed, stand, and ambulate with little to no assistance. Therapist discussed with pt's son the need for supervision at home for safety. He states sister will be with pt all the time. At conclusion of session, pt was returned to bed with sitter at bedside. RN/SW/MD all made aware of pt's progress and change in PT recommendation.     Follow Up Recommendations  Home health PT;Supervision/Assistance - 24 hour;Supervision for mobility/OOB     Equipment Recommendations  Rolling walker with 5" wheels    Recommendations for Other Services       Precautions / Restrictions Precautions Precautions: Fall    Mobility  Bed Mobility Overal bed mobility: Needs Assistance Bed Mobility: Supine to Sit;Sit to Supine     Supine to sit: Supervision Sit to supine: Supervision   General bed mobility comments: Pt was easily able to exit L side  of bed and returnt o supine after OOB activity  Transfers Overall transfer level: Needs assistance Equipment used: Rolling walker (2 wheeled) Transfers: Sit to/from Stand Sit to Stand: Min guard;Supervision         General transfer comment: CGA at first however was able to progress to supervision only  Ambulation/Gait Ambulation/Gait assistance: Min Emergency planning/management officer (Feet): 50 Feet Assistive device: Rolling walker (2 wheeled) Gait Pattern/deviations: WFL(Within Functional Limits) Gait velocity: WNL   General Gait Details: pt was easily able to ambulate 50 ft without LOB or safety concerns      Balance Overall balance assessment: Needs assistance Sitting-balance support: Feet supported Sitting balance-[name] Scale: Good Sitting balance - Comments: no LOB or unsteadiness sitting EOB   Standing balance support: Bilateral upper extremity supported;During functional activity Standing balance-[name] Scale: Fair Standing balance comment: no LOB in standing with BUE supported by RW       Cognition Arousal/Alertness: Awake/alert Behavior During Therapy: WFL for tasks assessed/performed;Impulsive Overall Cognitive Status:  (per son, cognition worse 5 days prior to admission)      General Comments: Pt is A but dioriented and pleasantly confused         General Comments General comments (skin integrity, edema, etc.): Lengthy discussion with pt's son about DC      Pertinent Vitals/Pain Pain Assessment: No/denies pain Faces Pain Scale: No hurt           PT Goals (current goals can now be found in the care plan section) Acute Rehab PT Goals Patient  Stated Goal: none stated Progress towards PT goals: Progressing toward goals    Frequency    Min 2X/week      PT Plan Discharge plan needs to be updated       AM-PAC PT "6 Clicks" Mobility   Outcome Measure  Help needed turning from your back to your side while in a flat bed without using bedrails?:  A Little Help needed moving from lying on your back to sitting on the side of a flat bed without using bedrails?: A Little Help needed moving to and from a bed to a chair (including a wheelchair)?: A Little Help needed standing up from a chair using your arms (e.g., wheelchair or bedside chair)?: A Little Help needed to walk in hospital room?: A Little Help needed climbing 3-5 steps with a railing? : A Little 6 Click Score: 18    End of Session Equipment Utilized During Treatment: Gait belt Activity Tolerance: Patient tolerated treatment well Patient left: in bed;with call bell/phone within reach;with bed alarm set Nurse Communication: Mobility status PT Visit Diagnosis: Other abnormalities of gait and mobility (R26.89);Muscle weakness (generalized) (M62.81);Difficulty in walking, not elsewhere classified (R26.2)     Time: 1030-1047 PT Time Calculation (min) (ACUTE ONLY): 17 min  Charges:  $Gait Training: 8-22 mins                     Jetta Lout PTA 04/04/20, 11:50 AM

## 2020-04-04 NOTE — Care Management Important Message (Signed)
Important Message  Patient Details  Name: Darryl Haley MRN: [id number] Date of Birth: [birth year]   Medicare Important Message Given:  Yes  Reviewed with son, Jaime Grizzell, via phone.  Copy of Medicare IM placed in mail to son's attention at patient's home address on chart.      [name] 04/04/2020, 12:47 PM

## 2020-04-04 NOTE — Discharge Instructions (Signed)
10 Things You Can Do to Manage Your COVID-19 Symptoms at Home If you have possible or confirmed COVID-19: 1. Stay home from work and school. And stay away from other public places. If you must go out, avoid using any kind of public transportation, ridesharing, or taxis. 2. Monitor your symptoms carefully. If your symptoms get worse, call your healthcare provider immediately. 3. Get rest and stay hydrated. 4. If you have a medical appointment, call the healthcare provider ahead of time and tell them that you have or may have COVID-19. 5. For medical emergencies, call 911 and notify the dispatch personnel that you have or may have COVID-19. 6. Cover your cough and sneezes with a tissue or use the inside of your elbow. 7. Wash your hands often with soap and water for at least 20 seconds or clean your hands with an alcohol-based hand sanitizer that contains at least 60% alcohol. 8. As much as possible, stay in a specific room and away from other people in your home. Also, you should use a separate bathroom, if available. If you need to be around other people in or outside of the home, wear a mask. 9. Avoid sharing personal items with other people in your household, like dishes, towels, and bedding. 10. Clean all surfaces that are touched often, like counters, tabletops, and doorknobs. Use household cleaning sprays or wipes according to the label instructions. [email or web address] 10/28/2018 This information is not intended to replace advice given to you by your health care provider. Make sure you discuss any questions you have with your health care provider. Document Revised: 04/01/2019 Document Reviewed: 04/01/2019 Elsevier Patient Education  2020 Else[employer] Inc[employer]   Atrial Fibrillation  Atrial fibrillation is a type of heartbeat that is irregular or fast. If you have this condition, your heart beats without any order. This makes it hard for your heart to pump blood in a normal way. Atrial  fibrillation may come and go, or it may become a long-lasting problem. If this condition is not treated, it can put you at higher risk for stroke, heart failure, and other heart problems. What are the causes? This condition may be caused by diseases that damage the heart. They include:  High blood pressure.  Heart failure.  Heart valve disease.  Heart surgery. Other causes include:  Diabetes.  Thyroid disease.  Being overweight.  Kidney disease. Sometimes the cause is not known. What increases the risk? You are more likely to develop this condition if:  You are older.  You smoke.  You exercise often and very hard.  You have a family history of this condition.  You are a man.  You use drugs.  You drink a lot of alcohol.  You have lung conditions, such as emphysema, pneumonia, or COPD.  You have sleep apnea. What are the signs or symptoms? Common symptoms of this condition include:  A feeling that your heart is beating very fast.  Chest pain or discomfort.  Feeling short of breath.  Suddenly feeling light-headed or weak.  Getting tired easily during activity.  Fainting.  Sweating. In some cases, there are no symptoms. How is this treated? Treatment for this condition depends on underlying conditions and how you feel when you have atrial fibrillation. They include:  Medicines to: ? Prevent blood clots. ? Treat heart rate or heart rhythm problems.  Using devices, such as a pacemaker, to correct heart rhythm problems.  Doing surgery to remove the part of the heart that sends bad signals.  Closing an area where clots can form in the heart (left atrial appendage). In some cases, your doctor will treat other underlying conditions. Follow these instructions at home: Medicines  Take over-the-counter and prescription medicines only as told by your doctor.  Do not take any new medicines without first talking to your doctor.  If you are taking blood  thinners: ? Talk with your doctor before you take any medicines that have aspirin or NSAIDs, such as ibuprofen, in them. ? Take your medicine exactly as told by your doctor. Take it at the same time each day. ? Avoid activities that could hurt or bruise you. Follow instructions about how to prevent falls. ? Wear a bracelet that says you are taking blood thinners. Or, carry a card that lists what medicines you take. Lifestyle      Do not use any products that have nicotine or tobacco in them. These include cigarettes, e-cigarettes, and chewing tobacco. If you need help quitting, ask your doctor.  Eat heart-healthy foods. Talk with your doctor about the right eating plan for you.  Exercise regularly as told by your doctor.  Do not drink alcohol.  Lose weight if you are overweight.  Do not use drugs, including cannabis. General instructions  If you have a condition that causes breathing to stop for a short period of time (apnea), treat it as told by your doctor.  Keep a healthy weight. Do not use diet pills unless your doctor says they are safe for you. Diet pills may make heart problems worse.  Keep all follow-up visits as told by your doctor. This is important. Contact a doctor if:  You notice a change in the speed, rhythm, or strength of your heartbeat.  You are taking a blood-thinning medicine and you get more bruising.  You get tired more easily when you move or exercise.  You have a sudden change in weight. Get help right away if:   You have pain in your chest or your belly (abdomen).  You have trouble breathing.  You have side effects of blood thinners, such as blood in your vomit, poop (stool), or pee (urine), or bleeding that cannot stop.  You have any signs of a stroke. "BE FAST" is an easy way to remember the main warning signs: ? B - Balance. Signs are dizziness, sudden trouble walking, or loss of balance. ? E - Eyes. Signs are trouble seeing or a change in  how you see. ? F - Face. Signs are sudden weakness or loss of feeling in the face, or the face or eyelid drooping on one side. ? A - Arms. Signs are weakness or loss of feeling in an arm. This happens suddenly and usually on one side of the body. ? S - Speech. Signs are sudden trouble speaking, slurred speech, or trouble understanding what people say. ? T - Time. Time to call emergency services. Write down what time symptoms started.  You have other signs of a stroke, such as: ? A sudden, very bad headache with no known cause. ? Feeling like you may vomit (nausea). ? Vomiting. ? A seizure. These symptoms may be an emergency. Do not wait to see if the symptoms will go away. Get medical help right away. Call your local emergency services (911 in the [place] Do not drive yourself to the hospital. Summary  Atrial fibrillation is a type of heartbeat that is irregular or fast.  You are at higher risk of this condition if you smoke,  are older, have diabetes, or are overweight.  Follow your doctor's instructions about medicines, diet, exercise, and follow-up visits.  Get help right away if you have signs or symptoms of a stroke.  Get help right away if you cannot catch your breath, or you have chest pain or discomfort. This information is not intended to replace advice given to you by your health care provider. Make sure you discuss any questions you have with your health care provider. Document Revised: 10/07/2018 Document Reviewed: 10/07/2018 Elsevier Patient Education  [place].

## 2022-04-08 IMAGING — DX DG CHEST 1V PORT
1 series · 1 of 1 positions shown · non-contrast
Comparison: None.

CLINICAL DATA: Weakness

EXAM:
PORTABLE CHEST 1 VIEW

[chest ap]
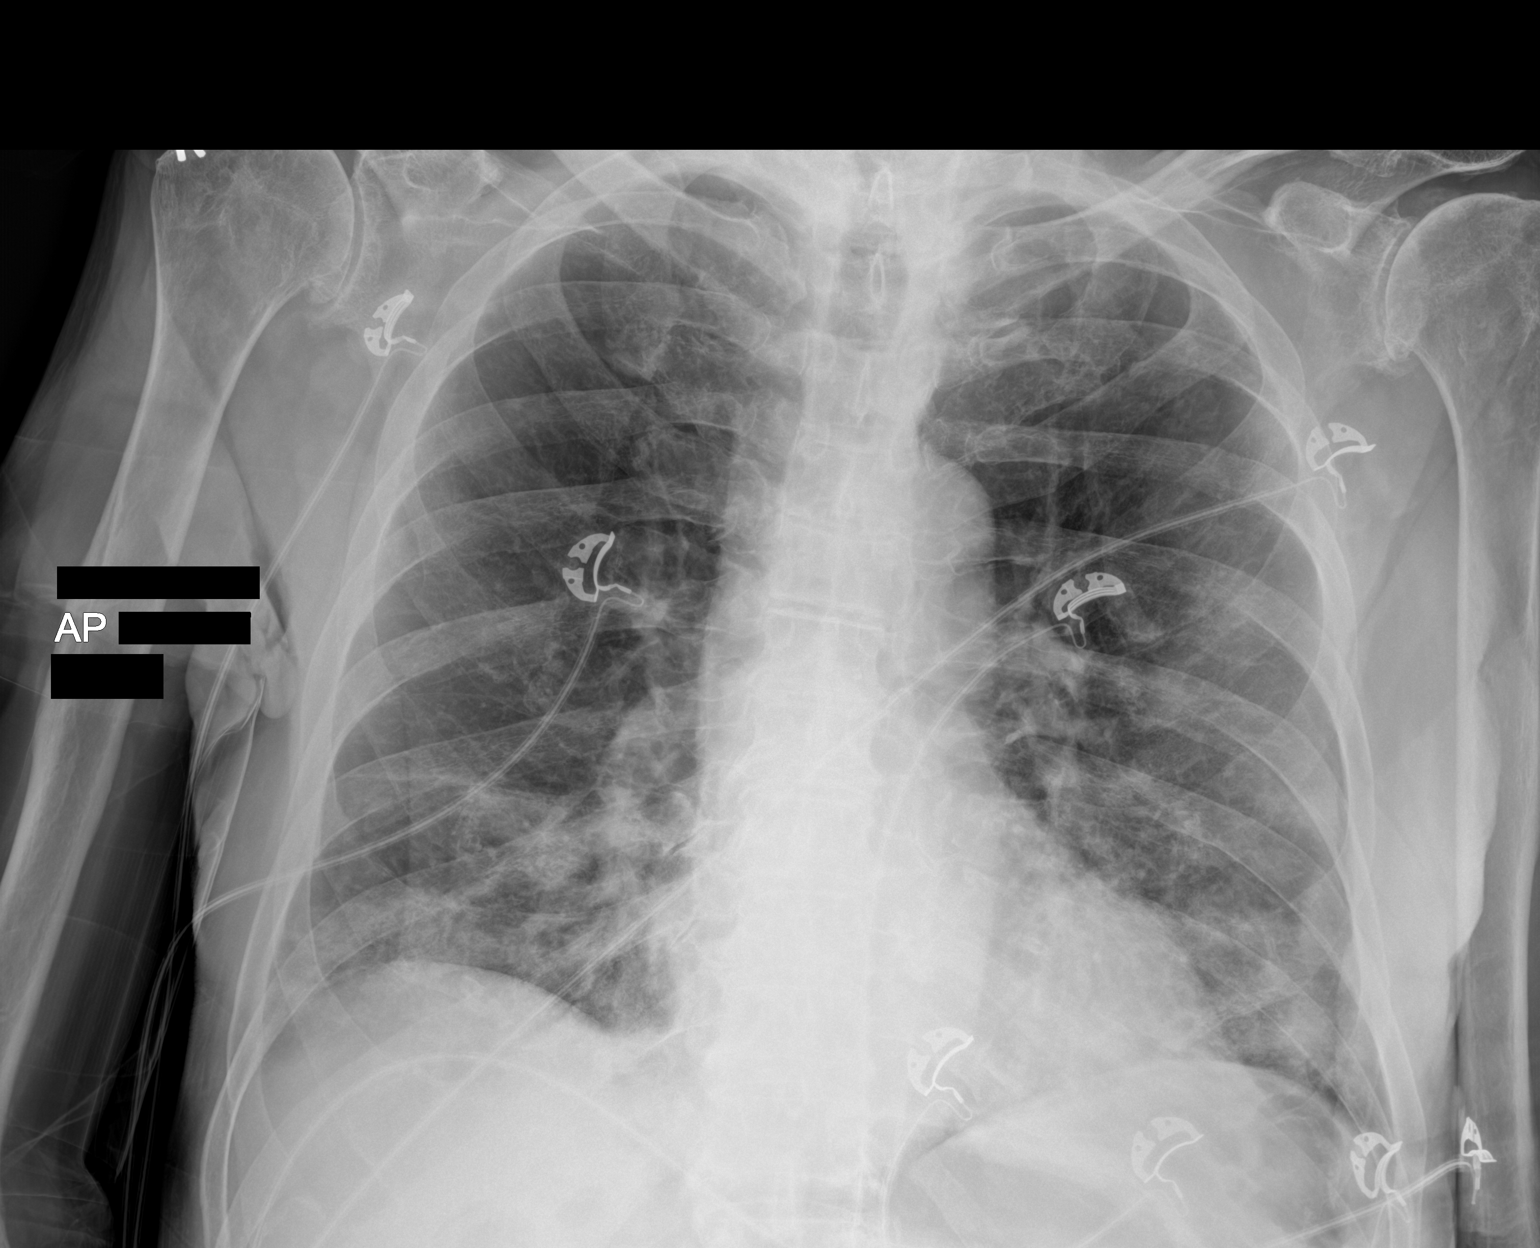

[1 of 1 positions shown; findings below may reference images not displayed]

FINDINGS: Patchy mid to lower lung airspace opacities. No pleural effusion.
Normal heart size. No pneumothorax.
IMPRESSION: Patchy mid to lower lung airspace opacities bilaterally, suspicious
for multifocal pneumonia.

## 2022-04-09 IMAGING — CT CT ANGIO CHEST
2 of 6 series · 18 of 46 positions shown · IV contrast (APPLIED)
Comparison: None.

CLINICAL DATA: COVID positive patient. Shortness of breath,
weakness and confusion.

EXAM:
CT ANGIOGRAPHY CHEST WITH CONTRAST
TECHNIQUE: Multidetector CT imaging of the chest was performed using the
standard protocol during bolus administration of intravenous
contrast. Multiplanar CT image reconstructions and MIPs were
obtained to evaluate the vascular anatomy.
CONTRAST:  75mL OMNIPAQUE IOHEXOL 350 MG/ML SOLN

[Series 5: thins · axial · 0.68mm/px · z∈[-958,-667]mm · 16 of 319 slices shown]
[im 14/319  lung]
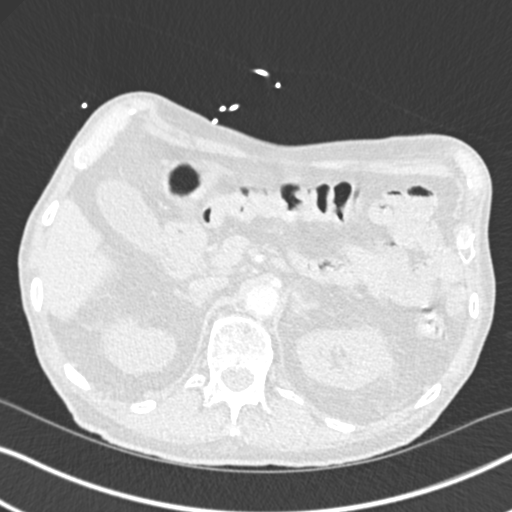
[im 42/319  soft-tissue]
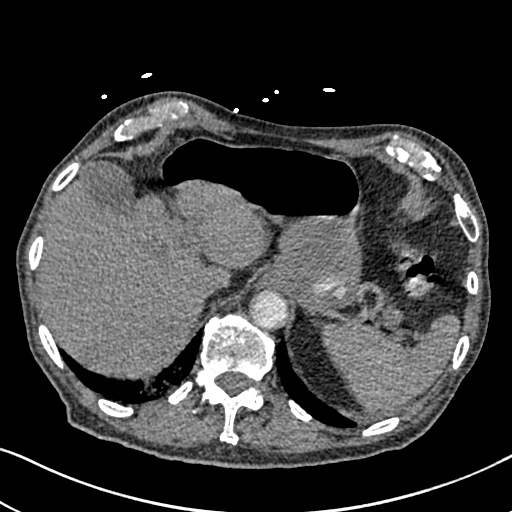
[im 56/319  lung]
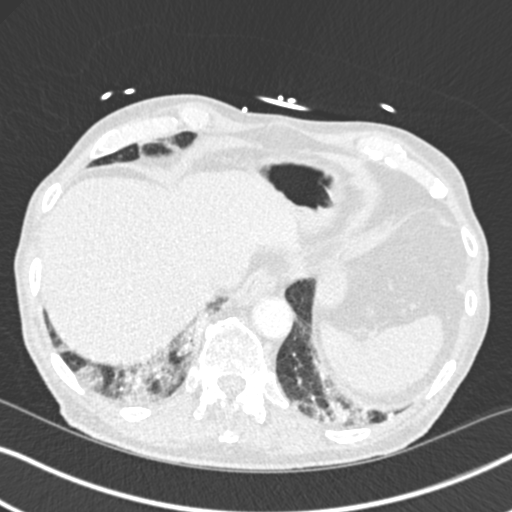
[im 70/319  soft-tissue]
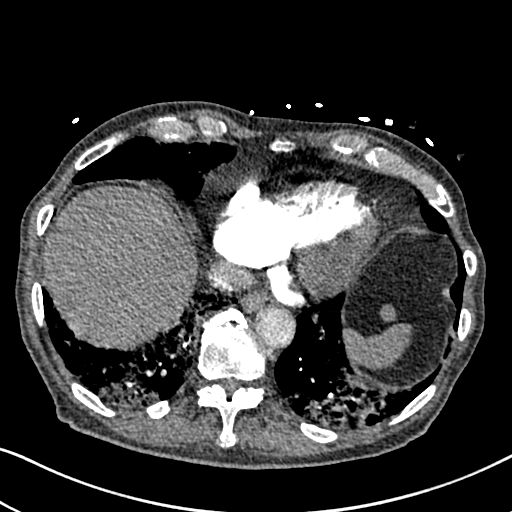
[im 97/319  lung]
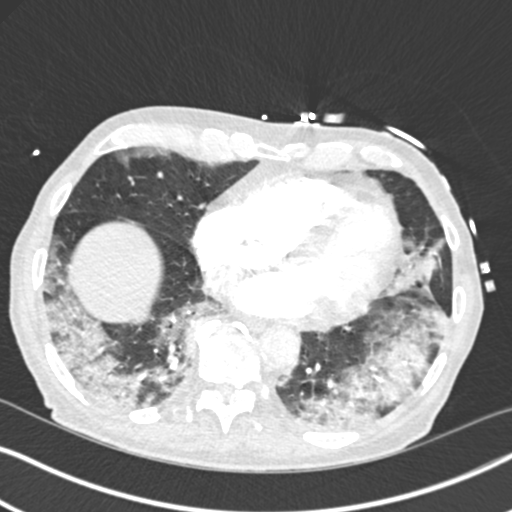
[im 111/319  soft-tissue]
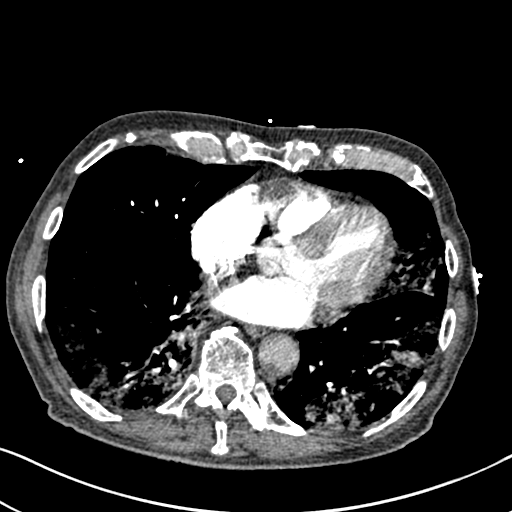
[im 125/319  lung]
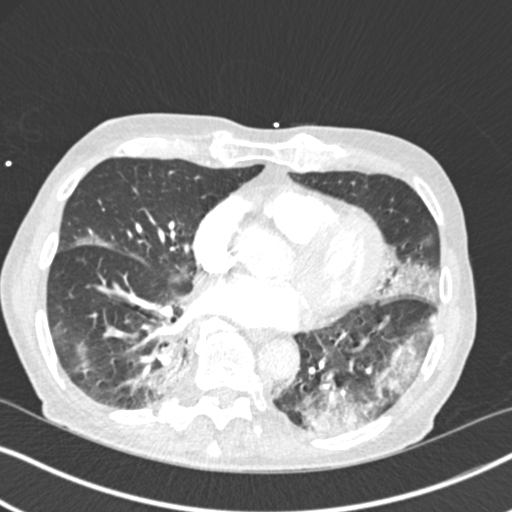
[im 153/319  soft-tissue]
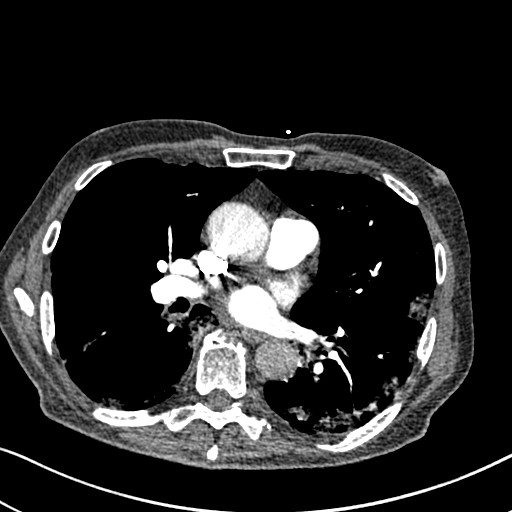
[im 166/319  lung]
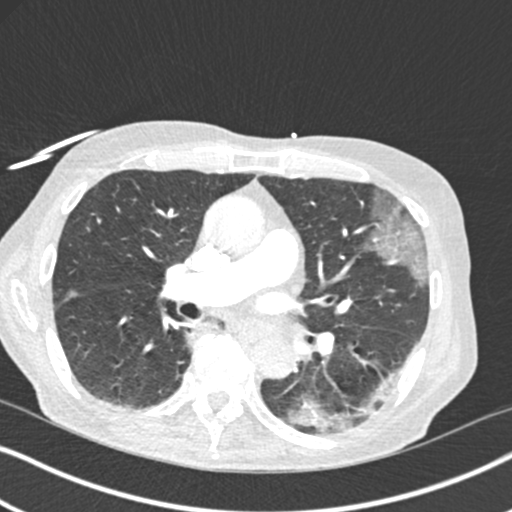
[im 194/319  soft-tissue]
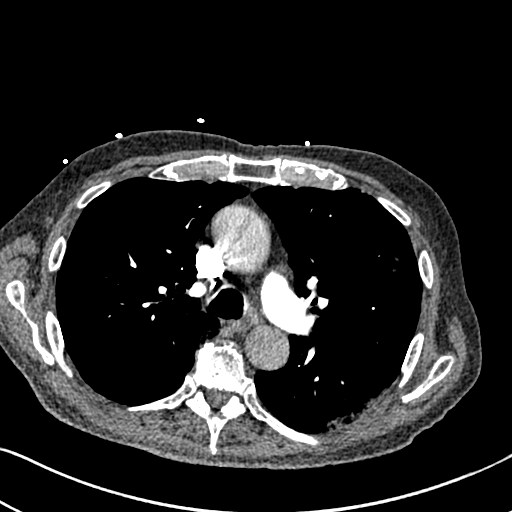
[im 208/319  lung]
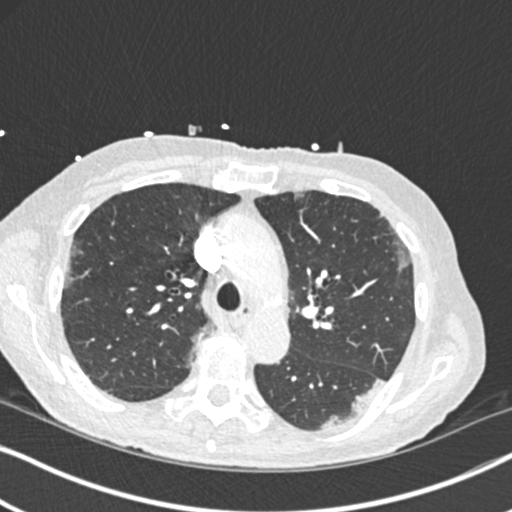
[im 222/319  soft-tissue]
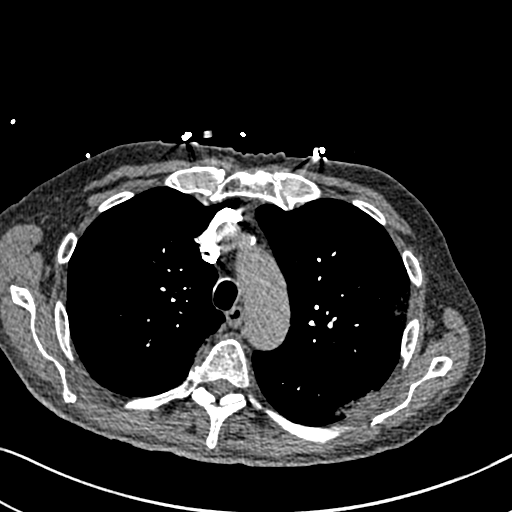
[im 249/319  lung]
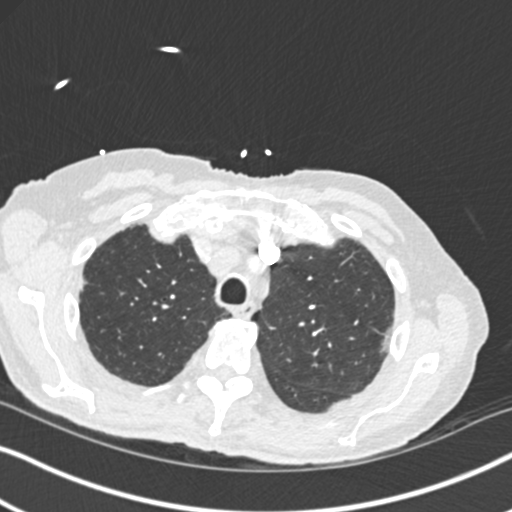
[im 263/319  soft-tissue]
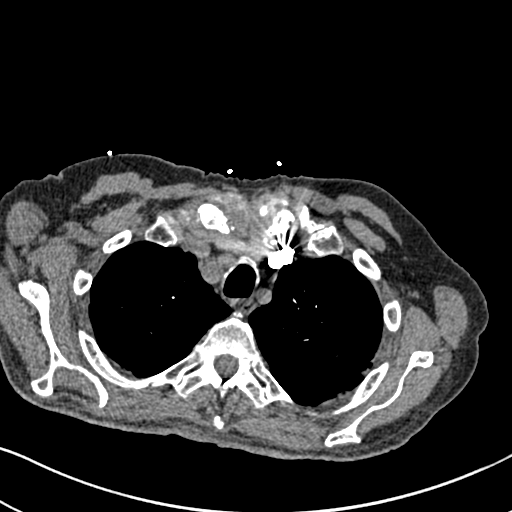
[im 277/319  lung]
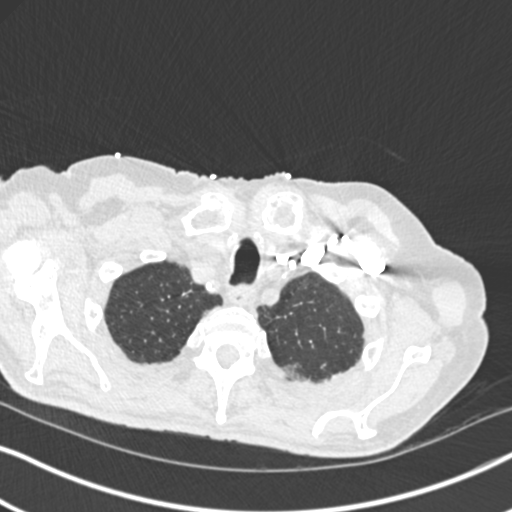
[im 305/319  soft-tissue]
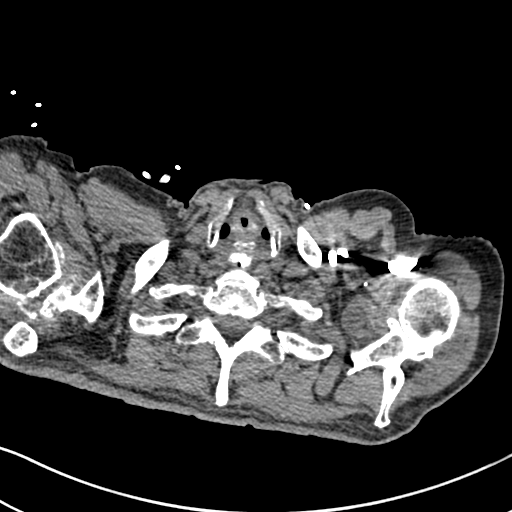

[Series 7: coronal mpr · coronal · 0.62mm/px · 2 of 80 slices shown]
[im 27/80  soft-tissue]
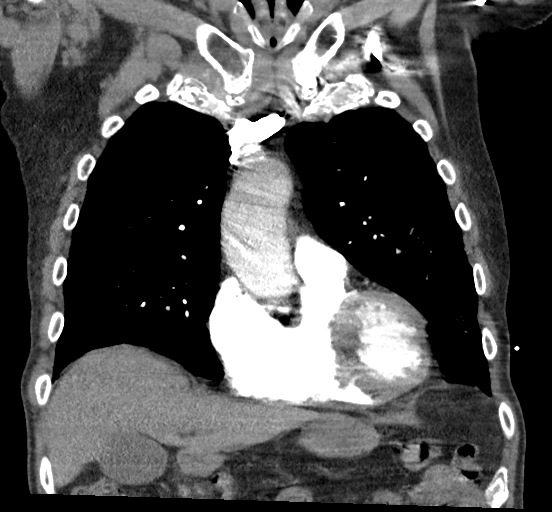
[im 53/80  soft-tissue]
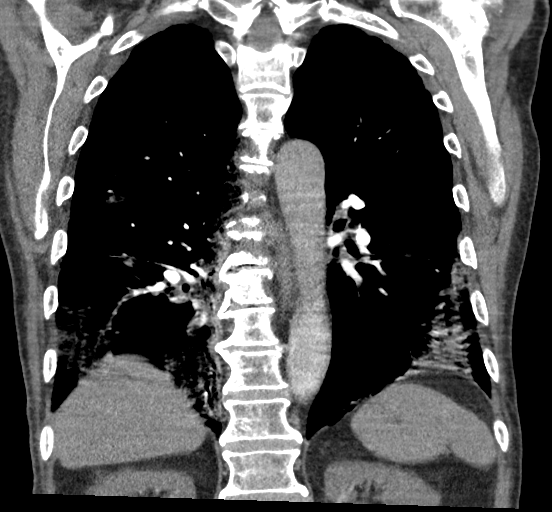

[18 of 46 positions shown; findings below may reference images not displayed]

FINDINGS: Cardiovascular: Mild cardiac enlargement. Mild aneurysmal dilatation
of the ascending thoracic aorta measures 4 cm, image [DATE]. Aortic
atherosclerosis. Coronary artery atherosclerotic calcification.

The main pulmonary artery appears patent. No obstructing pulmonary
emboli. Filling defect within the segmental branch to the lingula is
identified, image 161/5. A second filling defect is identified
within a segmental branch to the lateral right lung base, image
196/5.

Mediastinum/Nodes: No thyroid nodules. Trachea appears patent and
midline. Unremarkable appearance of the esophagus. There is no
axillary, supraclavicular, mediastinal adenopathy.

Lungs/Pleura: No pleural effusion. Bilateral, peripheral and basal
predominant ground-glass opacities and consolidation with extensive
geographic distribution. Imaging findings consistent with COVID
pneumonia.

Upper Abdomen: No acute findings

Musculoskeletal: Thoracic spondylosis. No acute or suspicious
osseous findings.

Review of the MIP images confirms the above findings.
IMPRESSION: 1. Examination is positive for acute pulmonary emboli within the
segmental branches to the lingula and right lateral lung base.
2. Bilateral, peripheral and basal predominant ground-glass
opacities and consolidation with extensive geographic distribution.
Imaging findings consistent with COVID pneumonia.
3. Aortic atherosclerosis. Coronary artery calcifications noted.

Aortic Atherosclerosis ([id number]).

Critical Value/emergent results were called by telephone at the time
of interpretation on 03/25/2020 at [DATE] to provider Jim
Aujla, Lesya, who verbally acknowledged these results.

## 2022-11-14 ENCOUNTER — Emergency Department: Payer: Medicare PPO

## 2022-11-14 ENCOUNTER — Encounter
Admission: EM | Disposition: A | Discharge status: Skilled Nursing Facility | Payer: Self-pay | Source: Home / Self Care | Attending: Student

## 2022-11-14 ENCOUNTER — Other Ambulatory Visit: Payer: Self-pay

## 2022-11-14 ENCOUNTER — Inpatient Hospital Stay: Payer: Medicare PPO

## 2022-11-14 ENCOUNTER — Inpatient Hospital Stay: Payer: Medicare PPO | Admitting: Anesthesiology

## 2022-11-14 ENCOUNTER — Inpatient Hospital Stay
Admission: EM | Admit: 2022-11-14 | Discharge: 2022-11-23 | DRG: 481 | Disposition: A | Discharge status: Skilled Nursing Facility | Payer: Medicare PPO | Attending: Internal Medicine | Admitting: Internal Medicine

## 2022-11-14 DIAGNOSIS — E538 Deficiency of other specified B group vitamins: Secondary | ICD-10-CM | POA: Diagnosis present

## 2022-11-14 DIAGNOSIS — Y92013 Bedroom of single-family (private) house as the place of occurrence of the external cause: Secondary | ICD-10-CM

## 2022-11-14 DIAGNOSIS — I480 Paroxysmal atrial fibrillation: Secondary | ICD-10-CM | POA: Insufficient documentation

## 2022-11-14 DIAGNOSIS — I10 Essential (primary) hypertension: Secondary | ICD-10-CM | POA: Diagnosis present

## 2022-11-14 DIAGNOSIS — S72142A Displaced intertrochanteric fracture of left femur, initial encounter for closed fracture: Secondary | ICD-10-CM | POA: Diagnosis not present

## 2022-11-14 DIAGNOSIS — R410 Disorientation, unspecified: Secondary | ICD-10-CM | POA: Insufficient documentation

## 2022-11-14 DIAGNOSIS — Z8616 Personal history of COVID-19: Secondary | ICD-10-CM | POA: Diagnosis not present

## 2022-11-14 DIAGNOSIS — R338 Other retention of urine: Secondary | ICD-10-CM | POA: Insufficient documentation

## 2022-11-14 DIAGNOSIS — F05 Delirium due to known physiological condition: Secondary | ICD-10-CM | POA: Insufficient documentation

## 2022-11-14 DIAGNOSIS — E669 Obesity, unspecified: Secondary | ICD-10-CM | POA: Diagnosis present

## 2022-11-14 DIAGNOSIS — I739 Peripheral vascular disease, unspecified: Secondary | ICD-10-CM | POA: Diagnosis present

## 2022-11-14 DIAGNOSIS — M25562 Pain in left knee: Secondary | ICD-10-CM | POA: Diagnosis not present

## 2022-11-14 DIAGNOSIS — Z7901 Long term (current) use of anticoagulants: Secondary | ICD-10-CM

## 2022-11-14 DIAGNOSIS — Z6831 Body mass index (BMI) 31.0-31.9, adult: Secondary | ICD-10-CM | POA: Diagnosis not present

## 2022-11-14 DIAGNOSIS — F0390 Unspecified dementia without behavioral disturbance: Secondary | ICD-10-CM | POA: Insufficient documentation

## 2022-11-14 DIAGNOSIS — F0392 Unspecified dementia, unspecified severity, with psychotic disturbance: Secondary | ICD-10-CM | POA: Diagnosis present

## 2022-11-14 DIAGNOSIS — D62 Acute posthemorrhagic anemia: Secondary | ICD-10-CM | POA: Diagnosis not present

## 2022-11-14 DIAGNOSIS — Z79899 Other long term (current) drug therapy: Secondary | ICD-10-CM

## 2022-11-14 DIAGNOSIS — W010XXA Fall on same level from slipping, tripping and stumbling without subsequent striking against object, initial encounter: Secondary | ICD-10-CM | POA: Diagnosis present

## 2022-11-14 DIAGNOSIS — E871 Hypo-osmolality and hyponatremia: Secondary | ICD-10-CM | POA: Diagnosis not present

## 2022-11-14 DIAGNOSIS — N401 Enlarged prostate with lower urinary tract symptoms: Secondary | ICD-10-CM | POA: Diagnosis present

## 2022-11-14 DIAGNOSIS — S72002A Fracture of unspecified part of neck of left femur, initial encounter for closed fracture: Secondary | ICD-10-CM | POA: Diagnosis not present

## 2022-11-14 DIAGNOSIS — Z86711 Personal history of pulmonary embolism: Secondary | ICD-10-CM

## 2022-11-14 DIAGNOSIS — Z8679 Personal history of other diseases of the circulatory system: Secondary | ICD-10-CM

## 2022-11-14 DIAGNOSIS — W19XXXA Unspecified fall, initial encounter: Principal | ICD-10-CM

## 2022-11-14 HISTORY — PX: INTRAMEDULLARY (IM) NAIL INTERTROCHANTERIC: SHX5875

## 2022-11-14 LAB — APTT: aPTT: 29 seconds (ref 24–36)

## 2022-11-14 LAB — CBC WITH DIFFERENTIAL/PLATELET
Abs Immature Granulocytes: 0.03 10*3/uL (ref 0.00–0.07)
Basophils Absolute: 0.1 10*3/uL (ref 0.0–0.1)
Basophils Relative: 1 %
Eosinophils Absolute: 0.3 10*3/uL (ref 0.0–0.5)
Eosinophils Relative: 3 %
HCT: 46.7 % (ref 39.0–52.0)
Hemoglobin: 15.4 g/dL (ref 13.0–17.0)
Immature Granulocytes: 0 %
Lymphocytes Relative: 19 %
Lymphs Abs: 1.9 10*3/uL (ref 0.7–4.0)
MCH: 29.7 pg (ref 26.0–34.0)
MCHC: 33 g/dL (ref 30.0–36.0)
MCV: 90.2 fL (ref 80.0–100.0)
Monocytes Absolute: 0.7 10*3/uL (ref 0.1–1.0)
Monocytes Relative: 7 %
Neutro Abs: 6.9 10*3/uL (ref 1.7–7.7)
Neutrophils Relative %: 70 %
Platelets: 226 10*3/uL (ref 150–400)
RBC: 5.18 MIL/uL (ref 4.22–5.81)
RDW: 13.1 % (ref 11.5–15.5)
WBC: 9.9 10*3/uL (ref 4.0–10.5)
nRBC: 0 % (ref 0.0–0.2)

## 2022-11-14 LAB — BASIC METABOLIC PANEL
Anion gap: 9 (ref 5–15)
BUN: 19 mg/dL (ref 8–23)
CO2: 23 mmol/L (ref 22–32)
Calcium: 8.6 mg/dL — ABNORMAL LOW (ref 8.9–10.3)
Chloride: 103 mmol/L (ref 98–111)
Creatinine, Ser: 1.24 mg/dL (ref 0.61–1.24)
GFR, Estimated: 55 mL/min — ABNORMAL LOW (ref >60)
Glucose, Bld: 118 mg/dL — ABNORMAL HIGH (ref 70–99)
Potassium: 4.1 mmol/L (ref 3.5–5.1)
Sodium: 135 mmol/L (ref 135–145)

## 2022-11-14 LAB — PHOSPHORUS: Phosphorus: 2.7 mg/dL (ref 2.5–4.6)

## 2022-11-14 LAB — PROTIME-INR
INR: 1 (ref 0.8–1.2)
Prothrombin Time: 13.3 seconds (ref 11.4–15.2)

## 2022-11-14 LAB — VITAMIN D 25 HYDROXY (VIT D DEFICIENCY, FRACTURES): Vit D, 25-Hydroxy: 21.3 ng/mL — ABNORMAL LOW (ref 30–100)

## 2022-11-14 LAB — TYPE AND SCREEN
ABO/RH(D): O POS
Antibody Screen: NEGATIVE

## 2022-11-14 LAB — MAGNESIUM: Magnesium: 2.2 mg/dL (ref 1.7–2.4)

## 2022-11-14 LAB — VITAMIN B12: Vitamin B-12: 217 pg/mL (ref 180–914)

## 2022-11-14 LAB — ABO/RH: ABO/RH(D): O POS

## 2022-11-14 SURGERY — FIXATION, FRACTURE, INTERTROCHANTERIC, WITH INTRAMEDULLARY ROD
Anesthesia: Spinal | Site: Hip | Laterality: Left

## 2022-11-14 MED ORDER — METHOCARBAMOL 1000 MG/10ML IJ SOLN: 500.0000 mg | Freq: Four times a day (QID) | INTRAVENOUS | Status: DC | PRN

## 2022-11-14 MED ORDER — PROPOFOL 1000 MG/100ML IV EMUL
INTRAVENOUS | Status: AC
  Filled 2022-11-14: qty 100

## 2022-11-14 MED ORDER — LIDOCAINE HCL (CARDIAC) PF 100 MG/5ML IV SOSY
PREFILLED_SYRINGE | INTRAVENOUS | Status: DC | PRN
  Administered 2022-11-14: 20 mg via INTRAVENOUS

## 2022-11-14 MED ORDER — CEFAZOLIN SODIUM-DEXTROSE 2-4 GM/100ML-% IV SOLN
2.0000 g | Freq: Four times a day (QID) | INTRAVENOUS | Status: AC
  Administered 2022-11-14 – 2022-11-15 (×3): 2 g via INTRAVENOUS
  Filled 2022-11-14 (×3): qty 100

## 2022-11-14 MED ORDER — EPHEDRINE 5 MG/ML INJ
INTRAVENOUS | Status: AC
  Filled 2022-11-14: qty 5

## 2022-11-14 MED ORDER — PROPOFOL 10 MG/ML IV BOLUS
INTRAVENOUS | Status: AC
  Filled 2022-11-14: qty 20

## 2022-11-14 MED ORDER — ONDANSETRON HCL 4 MG/2ML IJ SOLN
INTRAMUSCULAR | Status: AC
  Filled 2022-11-14: qty 2

## 2022-11-14 MED ORDER — ACETAMINOPHEN 10 MG/ML IV SOLN
INTRAVENOUS | Status: DC | PRN
  Administered 2022-11-14: 1000 mg via INTRAVENOUS

## 2022-11-14 MED ORDER — ESMOLOL HCL 100 MG/10ML IV SOLN
INTRAVENOUS | Status: DC | PRN
  Administered 2022-11-14: 30 mg via INTRAVENOUS

## 2022-11-14 MED ORDER — ACETAMINOPHEN 500 MG PO TABS
500.0000 mg | ORAL_TABLET | Freq: Four times a day (QID) | ORAL | Status: AC
  Administered 2022-11-14 – 2022-11-15 (×4): 500 mg via ORAL
  Filled 2022-11-14 (×4): qty 1

## 2022-11-14 MED ORDER — SUGAMMADEX SODIUM 500 MG/5ML IV SOLN
INTRAVENOUS | Status: DC | PRN
  Administered 2022-11-14: 200 mg via INTRAVENOUS

## 2022-11-14 MED ORDER — HYDROMORPHONE HCL 1 MG/ML IJ SOLN
INTRAMUSCULAR | Status: AC
  Filled 2022-11-14: qty 1

## 2022-11-14 MED ORDER — METOCLOPRAMIDE HCL 5 MG PO TABS: 5.0000 mg | ORAL_TABLET | Freq: Three times a day (TID) | ORAL | Status: DC | PRN

## 2022-11-14 MED ORDER — ONDANSETRON HCL 4 MG PO TABS
4.0000 mg | ORAL_TABLET | Freq: Four times a day (QID) | ORAL | Status: DC | PRN
  Administered 2022-11-20: 4 mg via ORAL
  Filled 2022-11-14: qty 1

## 2022-11-14 MED ORDER — ONDANSETRON HCL 4 MG/2ML IJ SOLN
INTRAMUSCULAR | Status: DC | PRN
  Administered 2022-11-14: 4 mg via INTRAVENOUS

## 2022-11-14 MED ORDER — OXYCODONE HCL 5 MG/5ML PO SOLN: 5.0000 mg | Freq: Once | ORAL | Status: DC | PRN

## 2022-11-14 MED ORDER — FLEET ENEMA 7-19 GM/118ML RE ENEM: 1.0000 | ENEMA | Freq: Once | RECTAL | Status: DC | PRN

## 2022-11-14 MED ORDER — DIPHENHYDRAMINE HCL 12.5 MG/5ML PO ELIX: 12.5000 mg | ORAL_SOLUTION | ORAL | Status: DC | PRN

## 2022-11-14 MED ORDER — CEFAZOLIN SODIUM-DEXTROSE 2-4 GM/100ML-% IV SOLN
2.0000 g | INTRAVENOUS | Status: AC
  Administered 2022-11-14: 2 g via INTRAVENOUS
  Filled 2022-11-14: qty 100

## 2022-11-14 MED ORDER — ACETAMINOPHEN 10 MG/ML IV SOLN: 1000.0000 mg | Freq: Once | INTRAVENOUS | Status: DC | PRN

## 2022-11-14 MED ORDER — PROPOFOL 10 MG/ML IV BOLUS
INTRAVENOUS | Status: DC | PRN
Start: 2022-11-14 — End: 2022-11-14
  Administered 2022-11-14: 20 mg via INTRAVENOUS
  Administered 2022-11-14: 10 mg via INTRAVENOUS
  Administered 2022-11-14: 20 mg via INTRAVENOUS

## 2022-11-14 MED ORDER — EPHEDRINE SULFATE (PRESSORS) 50 MG/ML IJ SOLN
INTRAMUSCULAR | Status: DC | PRN
  Administered 2022-11-14 (×2): 10 mg via INTRAVENOUS

## 2022-11-14 MED ORDER — HYDROCODONE-ACETAMINOPHEN 5-325 MG PO TABS
1.0000 | ORAL_TABLET | Freq: Four times a day (QID) | ORAL | Status: DC | PRN
  Administered 2022-11-15 – 2022-11-18 (×2): 2 via ORAL
  Administered 2022-11-18: 1 via ORAL
  Administered 2022-11-19: 2 via ORAL
  Administered 2022-11-21 (×2): 1 via ORAL
  Administered 2022-11-22: 2 via ORAL
  Filled 2022-11-14: qty 1
  Filled 2022-11-14 (×3): qty 2
  Filled 2022-11-14 (×2): qty 1
  Filled 2022-11-14 (×2): qty 2

## 2022-11-14 MED ORDER — ENOXAPARIN SODIUM 40 MG/0.4ML IJ SOSY
40.0000 mg | PREFILLED_SYRINGE | INTRAMUSCULAR | Status: DC
  Filled 2022-11-14: qty 0.4

## 2022-11-14 MED ORDER — FENTANYL CITRATE (PF) 100 MCG/2ML IJ SOLN
INTRAMUSCULAR | Status: AC
  Filled 2022-11-14: qty 2

## 2022-11-14 MED ORDER — DEXMEDETOMIDINE HCL IN NACL 80 MCG/20ML IV SOLN
INTRAVENOUS | Status: DC | PRN
  Administered 2022-11-14: 4 ug via INTRAVENOUS
  Administered 2022-11-14: 12 ug via INTRAVENOUS

## 2022-11-14 MED ORDER — PHENYLEPHRINE HCL (PRESSORS) 10 MG/ML IV SOLN
INTRAVENOUS | Status: DC | PRN
  Administered 2022-11-14: 240 ug via INTRAVENOUS
  Administered 2022-11-14 (×2): 160 ug via INTRAVENOUS
  Administered 2022-11-14: 120 ug via INTRAVENOUS
  Administered 2022-11-14: 80 ug via INTRAVENOUS

## 2022-11-14 MED ORDER — ROCURONIUM BROMIDE 100 MG/10ML IV SOLN
INTRAVENOUS | Status: DC | PRN
  Administered 2022-11-14: 30 mg via INTRAVENOUS
  Administered 2022-11-14: 5 mg via INTRAVENOUS
  Administered 2022-11-14: 15 mg via INTRAVENOUS
  Administered 2022-11-14: 10 mg via INTRAVENOUS

## 2022-11-14 MED ORDER — HYDROMORPHONE HCL 1 MG/ML IJ SOLN
INTRAMUSCULAR | Status: DC | PRN
  Administered 2022-11-14 (×2): .1 mg via INTRAVENOUS

## 2022-11-14 MED ORDER — DOCUSATE SODIUM 100 MG PO CAPS
100.0000 mg | ORAL_CAPSULE | Freq: Two times a day (BID) | ORAL | Status: DC
  Administered 2022-11-14 – 2022-11-23 (×16): 100 mg via ORAL
  Filled 2022-11-14 (×17): qty 1

## 2022-11-14 MED ORDER — DROPERIDOL 2.5 MG/ML IJ SOLN: 0.6250 mg | Freq: Once | INTRAMUSCULAR | Status: DC | PRN

## 2022-11-14 MED ORDER — ONDANSETRON HCL 4 MG/2ML IJ SOLN: 4.0000 mg | Freq: Four times a day (QID) | INTRAMUSCULAR | Status: DC | PRN

## 2022-11-14 MED ORDER — ENSURE ENLIVE PO LIQD
237.0000 mL | Freq: Two times a day (BID) | ORAL | Status: DC
  Administered 2022-11-15 – 2022-11-23 (×11): 237 mL via ORAL
  Filled 2022-11-14 (×4): qty 237

## 2022-11-14 MED ORDER — ACETAMINOPHEN 325 MG PO TABS
325.0000 mg | ORAL_TABLET | Freq: Four times a day (QID) | ORAL | Status: DC | PRN
  Administered 2022-11-21 – 2022-11-22 (×2): 650 mg via ORAL
  Filled 2022-11-14 (×2): qty 2

## 2022-11-14 MED ORDER — MAGNESIUM HYDROXIDE 400 MG/5ML PO SUSP: 30.0000 mL | Freq: Every day | ORAL | Status: DC | PRN

## 2022-11-14 MED ORDER — LACTATED RINGERS IV SOLN: INTRAVENOUS | Status: DC | PRN

## 2022-11-14 MED ORDER — BUPIVACAINE-EPINEPHRINE (PF) 0.5% -1:200000 IJ SOLN
INTRAMUSCULAR | Status: DC | PRN
  Administered 2022-11-14: 30 mL

## 2022-11-14 MED ORDER — DEXAMETHASONE SODIUM PHOSPHATE 10 MG/ML IJ SOLN
INTRAMUSCULAR | Status: AC
  Filled 2022-11-14: qty 1

## 2022-11-14 MED ORDER — HYDRALAZINE HCL 20 MG/ML IJ SOLN: 10.0000 mg | INTRAMUSCULAR | Status: DC | PRN

## 2022-11-14 MED ORDER — BISACODYL 10 MG RE SUPP: 10.0000 mg | Freq: Every day | RECTAL | Status: DC | PRN

## 2022-11-14 MED ORDER — DEXAMETHASONE SODIUM PHOSPHATE 10 MG/ML IJ SOLN
INTRAMUSCULAR | Status: DC | PRN
  Administered 2022-11-14: 10 mg via INTRAVENOUS

## 2022-11-14 MED ORDER — ADULT MULTIVITAMIN W/MINERALS CH
1.0000 | ORAL_TABLET | Freq: Every day | ORAL | Status: DC
  Administered 2022-11-15 – 2022-11-21 (×7): 1 via ORAL
  Filled 2022-11-14 (×8): qty 1

## 2022-11-14 MED ORDER — CEFAZOLIN SODIUM-DEXTROSE 2-4 GM/100ML-% IV SOLN
INTRAVENOUS | Status: AC
  Filled 2022-11-14: qty 100

## 2022-11-14 MED ORDER — PHENYLEPHRINE HCL (PRESSORS) 10 MG/ML IV SOLN
INTRAVENOUS | Status: AC
  Filled 2022-11-14: qty 1

## 2022-11-14 MED ORDER — DEXMEDETOMIDINE HCL IN NACL 80 MCG/20ML IV SOLN
INTRAVENOUS | Status: AC
  Filled 2022-11-14: qty 20

## 2022-11-14 MED ORDER — LABETALOL HCL 5 MG/ML IV SOLN
INTRAVENOUS | Status: AC
  Filled 2022-11-14: qty 4

## 2022-11-14 MED ORDER — PROMETHAZINE HCL 25 MG/ML IJ SOLN: 6.2500 mg | INTRAMUSCULAR | Status: DC | PRN

## 2022-11-14 MED ORDER — SODIUM CHLORIDE 0.9 % IV SOLN: INTRAVENOUS | Status: DC

## 2022-11-14 MED ORDER — MORPHINE SULFATE (PF) 2 MG/ML IV SOLN
0.5000 mg | INTRAVENOUS | Status: DC | PRN
  Administered 2022-11-14 (×2): 0.5 mg via INTRAVENOUS
  Filled 2022-11-14 (×2): qty 1

## 2022-11-14 MED ORDER — FENTANYL CITRATE (PF) 100 MCG/2ML IJ SOLN: 25.0000 ug | INTRAMUSCULAR | Status: DC | PRN

## 2022-11-14 MED ORDER — BUPIVACAINE-EPINEPHRINE (PF) 0.5% -1:200000 IJ SOLN
INTRAMUSCULAR | Status: AC
  Filled 2022-11-14: qty 10

## 2022-11-14 MED ORDER — HYDRALAZINE HCL 20 MG/ML IJ SOLN
5.0000 mg | INTRAMUSCULAR | Status: DC | PRN
  Administered 2022-11-14: 5 mg via INTRAVENOUS
  Filled 2022-11-14: qty 1

## 2022-11-14 MED ORDER — KETAMINE HCL 50 MG/5ML IJ SOSY
PREFILLED_SYRINGE | INTRAMUSCULAR | Status: AC
  Filled 2022-11-14: qty 5

## 2022-11-14 MED ORDER — KETAMINE HCL 10 MG/ML IJ SOLN
INTRAMUSCULAR | Status: DC | PRN
  Administered 2022-11-14: 20 mg via INTRAVENOUS
  Administered 2022-11-14: 30 mg via INTRAVENOUS

## 2022-11-14 MED ORDER — 0.9 % SODIUM CHLORIDE (POUR BTL) OPTIME
TOPICAL | Status: DC | PRN
  Administered 2022-11-14: 500 mL

## 2022-11-14 MED ORDER — ACETAMINOPHEN 10 MG/ML IV SOLN
INTRAVENOUS | Status: AC
  Filled 2022-11-14: qty 100

## 2022-11-14 MED ORDER — FENTANYL CITRATE (PF) 100 MCG/2ML IJ SOLN
INTRAMUSCULAR | Status: DC | PRN
  Administered 2022-11-14 (×2): 50 ug via INTRAVENOUS

## 2022-11-14 MED ORDER — VITAMIN D (ERGOCALCIFEROL) 1.25 MG (50000 UNIT) PO CAPS
50000.0000 [IU] | ORAL_CAPSULE | ORAL | Status: DC
  Administered 2022-11-15 – 2022-11-22 (×2): 50000 [IU] via ORAL
  Filled 2022-11-14 (×3): qty 1

## 2022-11-14 MED ORDER — METHOCARBAMOL 500 MG PO TABS
500.0000 mg | ORAL_TABLET | Freq: Four times a day (QID) | ORAL | Status: DC | PRN
  Administered 2022-11-14 – 2022-11-22 (×3): 500 mg via ORAL
  Filled 2022-11-14 (×3): qty 1

## 2022-11-14 MED ORDER — METOCLOPRAMIDE HCL 5 MG/ML IJ SOLN
5.0000 mg | Freq: Three times a day (TID) | INTRAMUSCULAR | Status: DC | PRN
  Filled 2022-11-14: qty 2

## 2022-11-14 MED ORDER — BUPIVACAINE HCL (PF) 0.5 % IJ SOLN
INTRAMUSCULAR | Status: AC
  Filled 2022-11-14: qty 10

## 2022-11-14 MED ORDER — OXYCODONE HCL 5 MG PO TABS: 5.0000 mg | ORAL_TABLET | Freq: Once | ORAL | Status: DC | PRN

## 2022-11-14 SURGICAL SUPPLY — 49 items
APL PRP STRL LF DISP 70% ISPRP (MISCELLANEOUS) ×2
BIT DRILL 4.3MMS DISTAL GRDTED (BIT) IMPLANT
BNDG CMPR 5X4 CHSV STRCH STRL (GAUZE/BANDAGES/DRESSINGS) ×1
BNDG CMPR 5X6 CHSV STRCH STRL (GAUZE/BANDAGES/DRESSINGS) ×1
BNDG COHESIVE 4X5 TAN STRL LF (GAUZE/BANDAGES/DRESSINGS) ×1 IMPLANT
BNDG COHESIVE 6X5 TAN ST LF (GAUZE/BANDAGES/DRESSINGS) ×1 IMPLANT
CHLORAPREP W/TINT 26 (MISCELLANEOUS) ×2 IMPLANT
DRAPE 3/4 80X56 (DRAPES) ×1 IMPLANT
DRAPE C-ARMOR (DRAPES) ×1 IMPLANT
DRILL 4.3MMS DISTAL GRADUATED (BIT) ×1
DRSG MEPILEX SACRM 8.7X9.8 (GAUZE/BANDAGES/DRESSINGS) ×1 IMPLANT
DRSG OPSITE POSTOP 3X4 (GAUZE/BANDAGES/DRESSINGS) IMPLANT
DRSG OPSITE POSTOP 4X6 (GAUZE/BANDAGES/DRESSINGS) IMPLANT
ELECT CAUTERY BLADE 6.4 (BLADE) ×1 IMPLANT
ELECT REM PT RETURN 9FT ADLT (ELECTROSURGICAL) ×1
ELECTRODE REM PT RTRN 9FT ADLT (ELECTROSURGICAL) ×1 IMPLANT
GAUZE SPONGE 4X4 12PLY STRL (GAUZE/BANDAGES/DRESSINGS) ×1 IMPLANT
GAUZE XEROFORM 1X8 LF (GAUZE/BANDAGES/DRESSINGS) IMPLANT
GLOVE BIO SURGEON STRL SZ7.5 (GLOVE) IMPLANT
GLOVE BIO SURGEON STRL SZ8 (GLOVE) ×2 IMPLANT
GLOVE INDICATOR 8.0 STRL GRN (GLOVE) ×1 IMPLANT
GOWN STRL REUS W/ TWL LRG LVL3 (GOWN DISPOSABLE) ×1 IMPLANT
GOWN STRL REUS W/ TWL XL LVL3 (GOWN DISPOSABLE) ×1 IMPLANT
GOWN STRL REUS W/TWL LRG LVL3 (GOWN DISPOSABLE) ×1
GOWN STRL REUS W/TWL XL LVL3 (GOWN DISPOSABLE) ×1
GUIDEPIN VERSANAIL DSP 3.2X444 (ORTHOPEDIC DISPOSABLE SUPPLIES) IMPLANT
GUIDEWIRE BALL NOSE 80CM (WIRE) IMPLANT
HANDLE YANKAUER SUCT OPEN TIP (MISCELLANEOUS) ×1 IMPLANT
HFN LAG SCREW 10.5MM X 115MM (Orthopedic Implant) IMPLANT
MANIFOLD NEPTUNE II (INSTRUMENTS) ×1 IMPLANT
MAT ABSORB FLUID 56X50 GRAY (MISCELLANEOUS) ×1 IMPLANT
NAIL HIP FRACT LT 130D 11X400 (Nail) IMPLANT
NDL FILTER BLUNT 18X1 1/2 (NEEDLE) ×1 IMPLANT
NDL HYPO 22X1.5 SAFETY MO (MISCELLANEOUS) ×1 IMPLANT
NEEDLE FILTER BLUNT 18X1 1/2 (NEEDLE) ×1 IMPLANT
NEEDLE HYPO 22X1.5 SAFETY MO (MISCELLANEOUS) ×1 IMPLANT
NS IRRIG 500ML POUR BTL (IV SOLUTION) ×1 IMPLANT
PACK HIP COMPR (MISCELLANEOUS) ×1 IMPLANT
SCREW BONE CORTICAL 5.0X44 (Screw) IMPLANT
STAPLER SKIN PROX 35W (STAPLE) ×1 IMPLANT
STRAP SAFETY 5IN WIDE (MISCELLANEOUS) ×1 IMPLANT
SUT VIC AB 0 CT1 36 (SUTURE) ×1 IMPLANT
SUT VIC AB 1 CT1 36 (SUTURE) ×1 IMPLANT
SUT VIC AB 2-0 CT1 (SUTURE) ×2 IMPLANT
SYR 10ML LL (SYRINGE) ×1 IMPLANT
SYR 30ML LL (SYRINGE) ×1 IMPLANT
TAPE MICROFOAM 4IN (TAPE) ×1 IMPLANT
TRAP FLUID SMOKE EVACUATOR (MISCELLANEOUS) ×1 IMPLANT
WATER STERILE IRR 500ML POUR (IV SOLUTION) ×1 IMPLANT

## 2022-11-14 NOTE — Consult Note (Signed)
ORTHOPAEDIC CONSULTATION  REQUESTING PHYSICIAN: Gillis Santa, MD  Chief Complaint:   Left hip pain.  History of Present Illness: Darryl Haley is a 89 y.o. male with several medical problems including dementia and early peripheral vascular disease who lives independently.  The patient apparently was in his usual state of health until last evening when he tripped and fell, landing on his left hip.  He was brought to the emergency room where x-rays demonstrated a displaced intertrochanteric fracture of the left hip.  The patient was admitted for pain control and orthopedic consultation was obtained to determine the definitive management of this injury.  The patient denies any associated injuries.  He did not strike his head or lose consciousness.  The patient also denies any lightheadedness, dizziness, chest pain, shortness of breath, or other symptoms which may have precipitated his fall.  History reviewed. No pertinent past medical history. History reviewed. No pertinent surgical history. Social History   Socioeconomic History   Marital status: Married    Spouse name: Not on file   Number of children: Not on file   Years of education: Not on file   Highest education level: Not on file  Occupational History   Not on file  Tobacco Use   Smoking status: Never   Smokeless tobacco: Never  Substance and Sexual Activity   Alcohol use: Never   Drug use: Never   Sexual activity: Not on file  Other Topics Concern   Not on file  Social History Narrative   Not on file   Social Determinants of Health   Financial Resource Strain: Not on file  Food Insecurity: Not on file  Transportation Needs: Not on file  Physical Activity: Not on file  Stress: Not on file  Social Connections: Not on file   History reviewed. No pertinent family history. No Known Allergies Prior to Admission medications   Medication Sig Start Date End Date  Taking? Authorizing Provider  albuterol (VENTOLIN HFA) 108 (90 Base) MCG/ACT inhaler Inhale 2 puffs into the lungs every 6 (six) hours as needed for wheezing or shortness of breath. Patient not taking: Reported on 11/14/2022 04/04/20   Tresa Moore, MD  apixaban (ELIQUIS) 5 MG TABS tablet Take 1 tablet (5 mg total) by mouth 2 (two) times daily. 04/04/20 08/02/20  Tresa Moore, MD  metoprolol tartrate (LOPRESSOR) 25 MG tablet Take 0.5 tablets (12.5 mg total) by mouth 2 (two) times daily. 04/04/20 05/04/20  Tresa Moore, MD  mirtazapine (REMERON) 15 MG tablet Take 1 tablet (15 mg total) by mouth at bedtime. 04/04/20 05/04/20  Tresa Moore, MD   CT HEAD WO CONTRAST ( )  Result Date: 11/14/2022 CLINICAL DATA:  fall, eval ich EXAM: CT HEAD WITHOUT CONTRAST TECHNIQUE: Contiguous axial images were obtained from the base of the skull through the vertex without intravenous contrast. RADIATION DOSE REDUCTION: This exam was performed according to the departmental dose-optimization program which includes automated exposure control, adjustment of the mA and/or kV according to patient size and/or use of iterative reconstruction technique. COMPARISON:  CT head 03/24/2020 FINDINGS: Brain: Cerebral ventricle sizes are concordant with the degree of cerebral volume loss. Patchy and confluent areas of decreased attenuation are noted throughout the deep and periventricular Umholtz matter of the cerebral hemispheres bilaterally, compatible with chronic microvascular ischemic disease. No evidence of large-territorial acute infarction. No parenchymal hemorrhage. No mass lesion. No extra-axial collection. No mass effect or midline shift. No hydrocephalus. Basilar cisterns are patent. Vascular: No hyperdense vessel. Skull:  No acute fracture or focal lesion. Sinuses/Orbits: Almost complete opacification of the right sphenoid sinus. Otherwise paranasal sinuses and mastoid air cells are clear. The orbits are  unremarkable. Other: None. IMPRESSION: No acute intracranial abnormality. Electronically Signed   By: Tish Frederickson M.D.   On: 11/14/2022 01:22   DG HIP UNILAT WITH PELVIS 2-3 VIEWS LEFT  Result Date: 11/14/2022 CLINICAL DATA:  Fall today with obvious deformity to left leg. Pain from hip to knee. EXAM: DG HIP (WITH OR WITHOUT PELVIS) 2-3V LEFT COMPARISON:  None Available. FINDINGS: Acute comminuted displaced intertrochanteric fracture of the left femur. Slight superolateral displacement of the distal fragment. No dislocation. Degenerative changes pubic symphysis, both hips, SI joints and lower lumbar spine. IMPRESSION: Comminuted displaced intertrochanteric fracture of the left femur. Electronically Signed   By: Minerva Fester M.D.   On: 11/14/2022 01:12   DG Knee Complete 4 Views Left  Result Date: 11/14/2022 CLINICAL DATA:  fall, left leg is shortened and rotated. complaining of knee pain. eval fx EXAM: LEFT KNEE - COMPLETE 4+ VIEW COMPARISON:  None Available. FINDINGS: No evidence of fracture, dislocation, or joint effusion. Tricompartmental severe degenerative changes of the knee. Soft tissues are unremarkable. Vascular calcifications. IMPRESSION: No acute displaced fracture or dislocation. Electronically Signed   By: Tish Frederickson M.D.   On: 11/14/2022 01:12   DG Chest 1 View  Result Date: 11/14/2022 CLINICAL DATA:  [id number] Fall [id number] EXAM: CHEST  1 VIEW COMPARISON:  Chest x-ray 03/24/2020 FINDINGS: The heart and mediastinal contours are within normal limits. Atherosclerotic plaque. Left base atelectasis. Slightly elevated left hemidiaphragm. No focal consolidation. No pulmonary edema. No pleural effusion. No pneumothorax. No acute osseous abnormality. IMPRESSION: 1. Left base atelectasis with no active disease. 2.  Aortic Atherosclerosis (ICD10-I70.0). Electronically Signed   By: Tish Frederickson M.D.   On: 11/14/2022 01:04    Positive ROS: All other systems have been reviewed and were  otherwise negative with the exception of those mentioned in the HPI and as above.  Physical Exam: General:  Alert, no acute distress Psychiatric:  Patient is competent for consent with normal mood and affect   Cardiovascular:  No pedal edema Respiratory:  No wheezing, non-labored breathing GI:  Abdomen is soft and non-tender Skin:  No lesions in the area of chief complaint Neurologic:  Sensation intact distally Lymphatic:  No axillary or cervical lymphadenopathy  Orthopedic Exam:  Orthopedic examination is limited to the left hip and lower extremity.  The left lower extremity is somewhat shortened and externally rotated as compared to the right.  Skin inspection around the left hip is unremarkable.  No swelling, erythema, ecchymosis, abrasions, or other skin abnormalities are identified.  He has mild tenderness to palpation over the lateral aspect of the hip.  He has more severe pain with any attempted active or passive motion of the hip.  He is grossly neurovascularly intact to the left lower extremity and foot.  X-rays:  Recent x-rays of the pelvis and left hip are available for review and have been reviewed by myself.  These films demonstrate a comminuted displaced intertrochanteric fracture of the left hip.  The hip joint itself appears to be well-maintained.  No lytic lesions or other acute bony abnormalities are identified.  Assessment: Closed displaced intertrochanteric fracture left hip.  Plan: The treatment options, including both surgical and nonsurgical choices, have been discussed in detail with the patient and his family.  The patient and his family would like to proceed with surgical intervention  to include an intramedullary nailing of the displaced intertrochanteric fracture of the left hip.  The risks (including bleeding, infection, nerve and/or blood vessel injury, persistent or recurrent pain, loosening or failure of the fixation device, leg length inequality, malunion and/or  nonunion, need for further surgery, blood clots, strokes, heart attacks or arrhythmias, pneumonia, etc.) and benefits of the surgical procedure were discussed. The patient and his family state their understanding and agree to proceed.  A formal written consent will be obtained by the nursing staff.  Thank you for asking me to participate in the care of this most pleasant yet unfortunate man.  I will be happy to follow him with you.   Maryagnes Amos, MD  Beeper #:  [phone]  11/14/2022 1:28 PM

## 2022-11-14 NOTE — Op Note (Signed)
11/14/2022  4:24 PM  Patient:   Darryl Haley  Pre-Op Diagnosis:   Closed displaced intertrochanteric fracture, left hip.  Post-Op Diagnosis:   Same  Procedure:   Reduction and internal fixation of displaced intertrochanteric left hip fracture with Biomet Affixis TFN nail.  Surgeon:   Maryagnes Amos, MD  Assistant:   None  Anesthesia:   GET  Findings:   As above  Complications:   None  EBL:   100 cc  Fluids:   500 cc crystalloid  UOP:   None  TT:   None  Drains:   None  Closure:   Staples  Implants:   Biomet Affixis 11 x 400 mm TFN with a 115 mm lag screw and a 44 mm distal interlocking screw  Brief Clinical Note:   The patient is a 89 year old male who sustained above-noted injury last evening when he apparently lost his balance and fell while at his house. He was brought to the emergency room where x-rays demonstrated the above-noted injury. The patient has been cleared medically and presents at this time for reduction and internal fixation of the displaced intertrochanteric left hip fracture.  Procedure:   The patient was brought into the operating room and lain in the supine position. After adequate general endotracheal intubation and anesthesia were obtained, the patient was repositioned on the fracture table. The uninjured leg was placed in a flexed and abducted position while the injured lower extremity was placed in longitudinal traction. The fracture was reduced using longitudinal traction and internal rotation. The adequacy of reduction was verified fluoroscopically in AP and lateral projections and found to be near anatomic. The lateral aspects of the left hip and thigh were prepped with ChloraPrep solution before being draped sterilely. Preoperative antibiotics were administered. A timeout was performed to verify the appropriate surgical site.   The greater trochanter was identified fluoroscopically and an approximately 4-5 cm incision made about 2-3 fingerbreadths  above the tip of the greater trochanter. The incision was carried down through the subcutaneous tissues to expose the gluteal fascia. This was split the length of the incision, providing access to the tip of the trochanter. Under fluoroscopic guidance, a guidewire was drilled through the tip of the trochanter into the proximal metaphysis to the level of the lesser trochanter. After verifying its position fluoroscopically in AP and lateral projections, it was overreamed with the initial reamer to the depth of the lesser trochanter. A guidewire was passed down through the femoral canal to the supracondylar region. The adequacy of guidewire position was verified fluoroscopically in AP and lateral projections before the length of the guidewire within the canal was measured and found to be 415 mm. Therefore, a 400 mm length nail was selected. The guidewire was overreamed sequentially using the flexible reamers, beginning with a 10.5 mm reamer and progressing to a 12.5 mm reamer. This provided good cortical chatter. The 11 x 400 mm Biomet Affixis TFN rod was selected and advanced to the appropriate depth, as verified fluoroscopically.   The guide system for the lag screw was positioned and advanced through an approximately 2 cm stab incision over the lateral aspect of the proximal femur. The guidewire was drilled up through the trochanteric femoral nail and into the femoral neck to rest within 5 mm of subchondral bone. After verifying its position in the femoral neck and head in both AP and lateral projections, the guidewire was measured and found to be optimally replicated by a 115 mm lag screw. The  guidewire was overreamed to the appropriate depth before the lag screw was inserted and advanced to the appropriate depth as verified fluoroscopically in AP and lateral projections. The locking screw was advanced, then backed off a quarter turn to set the lag screw. Again the adequacy of hardware position and fracture  reduction was verified fluoroscopically in AP and lateral projections and found to be excellent.  Attention was directed distally. Using the "perfect circle" technique, the leg and fluoroscopy machine were positioned appropriately. An approximately 1.5 cm stab incision was made over the skin at the appropriate point before the drill bit was advanced through the cortex and across the static hole of the nail. The appropriate length of the screw was determined before the 44 mm distal interlocking screw was positioned, then advanced and tightened securely. Again the adequacy of screw position was verified fluoroscopically in AP and lateral projections and found to be excellent.  The wounds were irrigated thoroughly with sterile saline solution before the abductor fascia was reapproximated using #1 Vicryl interrupted sutures. The subcutaneous tissues were closed using 2-0 Vicryl interrupted sutures. The skin was closed using staples. A total of 30 cc of 0.5% Sensorcaine with epinephrine was injected in and around all incisions. Sterile occlusive dressings were applied to all wounds before the patient was transferred back to his hospital bed. The patient was then returned to the recovery room in satisfactory condition after tolerating the procedure well.

## 2022-11-14 NOTE — ED Notes (Signed)
Pt transported to pre-op at this time. Stable at time of departure.

## 2022-11-14 NOTE — Assessment & Plan Note (Signed)
Accidental fall Patient with history of untreated A-fib and with elevated blood pressure not on any antihypertensives Patient at low to moderate risk for perioperative cardiopulmonary events Control blood pressure N.p.o. for hip repair on 7/18 Dr. Joice Lofts was consulted from the ED

## 2022-11-14 NOTE — Progress Notes (Signed)
Initial Nutrition Assessment  DOCUMENTATION CODES:   Not applicable  INTERVENTION:   Ensure Enlive po BID, each supplement provides 350 kcal and 20 grams of protein.  MVI po daily   Ergocalciferol 50,000 units po weekly x 6 weeks   NUTRITION DIAGNOSIS:   Increased nutrient needs related to hip fracture as evidenced by estimated needs.  GOAL:   Patient will meet greater than or equal to 90% of their needs  MONITOR:   PO intake, Supplement acceptance, Labs, Weight trends, I & O's, Skin  REASON FOR ASSESSMENT:   Consult Hip fracture protocol  ASSESSMENT:   89 y.o. male with medical history significant for dementia, A-fib, COVID-pneumonia with acute PE, HTN and PVD who is admitted with hip fracture after fall.  Pt s/p R ORIF today  RD unable to see patient today as pt in OR for most of the day. Pt with increased estimated needs r/t hip fracture. RD will add supplements and MVI to help pt meet his estimated needs. Pt is noted to have vitamin D deficiency; RD will provide supplementation. There is no recent weight history to determine if any significant weight changes. RD will obtain nutrition related history and exam at follow-up.   Medications reviewed:  Labs reviewed: K 4.1 wnl, P 2.7 wnl, Mg 2.2 wnl Vitamin D- 31.30(L)  NUTRITION - FOCUSED PHYSICAL EXAM: Unable to perform at this time   Diet Order:   Diet Order             Diet NPO time specified  Diet effective now                  EDUCATION NEEDS:   Not appropriate for education at this time  Skin:  Skin Assessment: Reviewed RN Assessment (incision L hip)  Last BM:  7/17  Height:   Ht Readings from Last 1 Encounters:  11/14/22 5\' 8"  (1.727 m)    Weight:   Wt Readings from Last 1 Encounters:  11/14/22 77.1 kg    Ideal Body Weight:  70 kg  BMI:  Body mass index is 25.85 kg/m.  Estimated Nutritional Needs:   Kcal:  1800-2100kcal/day  Protein:  90-105g/day  Fluid:   1.8-2.1L/day  Betsey Holiday MS, RD, LDN Please refer to [facility] for RD and/or RD on-call/weekend/after hours pager

## 2022-11-14 NOTE — ED Triage Notes (Signed)
Pt to ED via EMS from home, pt reports seeing someone outside and went to get up and lost his balance. Pt c/o pain to left knee, pt has some external rotation to extremity. Pt has dried blood to left side of nose. Pt denies taking blood thinners. Pt does not take any medications, per ems pt has refused seeing drs for the past few years. On scene pt was not able to stand on left leg.

## 2022-11-14 NOTE — Assessment & Plan Note (Addendum)
Patient was prescribed metoprolol and Eliquis 3 years ago but ran out of refills after he refused to visit the doctors office to get his refills renewed Has followed with Dr. Welton Flakes of [facility] in the past

## 2022-11-14 NOTE — ED Notes (Signed)
Pt and family given heat packs upon request for patients left knee

## 2022-11-14 NOTE — Anesthesia Preprocedure Evaluation (Addendum)
Anesthesia Evaluation  Patient identified by MRN, date of birth, ID band Patient confused  General Assessment Comment:Alert but only oriented to self and family in room  Reviewed: Allergy & Precautions, H&P , NPO status , Patient's Chart, lab work & pertinent test results  Airway Mallampati: II  TM Distance: >3 FB Neck ROM: full    Dental  (+) Chipped, Poor Dentition, Missing   Pulmonary neg pulmonary ROS   Pulmonary exam normal        Cardiovascular METS: 3 - Mets hypertension, + dysrhythmias Atrial Fibrillation  Rhythm:Regular Rate:Tachycardia  ECHO 2021:  1. Left ventricular ejection fraction, by estimation, is 55 to 60%. The  left ventricle has normal function. The left ventricle has no regional  wall motion abnormalities. Left ventricular diastolic parameters were  normal.   2. Right ventricular systolic function is normal. The right ventricular  size is normal.   3. The mitral valve is grossly normal. Trivial mitral valve  regurgitation.   4. The aortic valve is grossly normal. Aortic valve regurgitation is  mild.     Neuro/Psych  PSYCHIATRIC DISORDERS     Dementia negative neurological ROS     GI/Hepatic negative GI ROS, Neg liver ROS,,,  Endo/Other  negative endocrine ROS    Renal/GU negative Renal ROS     Musculoskeletal   Abdominal Normal abdominal exam  (+)   Peds  Hematology negative hematology ROS (+)   Anesthesia Other Findings Dementia History of paranoid thought content Closed left hip fracture History of atrial fibrillation- untreated HTN  BMI    Body Mass Index: 25.85 kg/m      Reproductive/Obstetrics negative OB ROS                             Anesthesia Physical Anesthesia Plan  ASA: 3  Anesthesia Plan: Spinal   Post-op Pain Management:    Induction: Intravenous  PONV Risk Score and Plan: Propofol infusion  Airway Management Planned: Natural  Airway  Additional Equipment:   Intra-op Plan:   Post-operative Plan:   Informed Consent: I have reviewed the patients History and Physical, chart, labs and discussed the procedure including the risks, benefits and alternatives for the proposed anesthesia with the patient or authorized representative who has indicated his/her understanding and acceptance.     Dental Advisory Given and Consent reviewed with POA  Plan Discussed with: CRNA and Surgeon  Anesthesia Plan Comments:         Anesthesia Quick Evaluation

## 2022-11-14 NOTE — Assessment & Plan Note (Addendum)
History of paranoid thought content According to daughter at bedside, patient has a lot of paranoia and gets up often at night to look out the window thinking that the street lamps from, is trying to look in Delirium precautions

## 2022-11-14 NOTE — Assessment & Plan Note (Signed)
Delirium precautions 

## 2022-11-14 NOTE — H&P (Signed)
History and Physical    Patient: Darryl Haley NWG:[id number] DOB: [birth year] DOA: 11/14/2022 DOS: the patient was seen and examined on 11/14/2022 PCP: Pcp, No  Patient coming from: Home  Chief Complaint:  Chief Complaint  Patient presents with   Fall    HPI: Darryl Haley is a 89 y.o. male with medical history significant for Mild cognitive deficit, hospitalized in 2021 under IVC for confusion and paranoid ideation as well as new onset rapid A-fib in the setting of acute COVID-pneumonia with acute PE, who still lives independently but whose daughter checks in on him frequently who presents to the ED following a fall.  According to the daughter, patient gets up a few times every night to either look out the window or go to the bathroom.  She believes he must have tripped on the rug..  He was otherwise in his usual state of health.  He has had no recent complaints of cough, shortness of breath, nausea, vomiting, abdominal pain.  He had no complaints of chest pain, palpitations or lightheadedness.  Patient has not seen a doctor since his hospitalization in 2021 and is no longer on anticoagulation which he stopped taking when it ran out following his discharge.  He has seen a cardiologist, Dr. Welton Flakes in the past when he was diagnosed with atrial fibrillation ED course and data review: BP 185/78 with otherwise normal vitals.  Labs including CBC, BMP and INR for the most part unremarkable. EKG reviewed and interpreted appears to be A-fib with controlled rate in the 80s Chest x-ray nonacute Left knee x-ray nonacute Left hip showing comminuted displaced intertrochanteric fracture of the left femur The ED provider spoke with Dr. Joice Lofts who will take patient to the OR later today. Hospitalist consulted for admission.     Social History:  reports that he has never smoked. He has never used smokeless tobacco. He reports that he does not drink alcohol and does not use drugs.  No Known Allergies  History  reviewed. No pertinent family history.  Prior to Admission medications   Medication Sig Start Date End Date Taking? Authorizing Provider  albuterol (VENTOLIN HFA) 108 (90 Base) MCG/ACT inhaler Inhale 2 puffs into the lungs every 6 (six) hours as needed for wheezing or shortness of breath. 04/04/20   Sreenath, Jonelle Sports, MD  apixaban (ELIQUIS) 5 MG TABS tablet Take 1 tablet (5 mg total) by mouth 2 (two) times daily. 04/04/20 08/02/20  Tresa Moore, MD  metoprolol tartrate (LOPRESSOR) 25 MG tablet Take 0.5 tablets (12.5 mg total) by mouth 2 (two) times daily. 04/04/20 05/04/20  Tresa Moore, MD  mirtazapine (REMERON) 15 MG tablet Take 1 tablet (15 mg total) by mouth at bedtime. 04/04/20 05/04/20  Tresa Moore, MD    Physical Exam: Vitals:   11/14/22 0013 11/14/22 0015  BP:  (!) 185/78  Pulse:  84  Resp:  18  Temp:  97.8 F (36.6 C)  TempSrc:  Oral  SpO2:  100%  Weight: 77.1 kg   Height: 5\' 8"  (1.727 m)    Physical Exam Vitals and nursing note reviewed.  Constitutional:      General: He is not in acute distress. HENT:     Head: Normocephalic.     Comments: Dried blood at left nasolabial fold Cardiovascular:     Rate and Rhythm: Normal rate and regular rhythm.     Heart sounds: Normal heart sounds.  Pulmonary:     Effort: Pulmonary effort is normal.  Breath sounds: Normal breath sounds.  Abdominal:     Palpations: Abdomen is soft.     Tenderness: There is no abdominal tenderness.  Neurological:     Mental Status: Mental status is at baseline.     Labs on Admission: I have personally reviewed following labs and imaging studies  CBC: Recent Labs  Lab 11/14/22 0017  WBC 9.9  NEUTROABS 6.9  HGB 15.4  HCT 46.7  MCV 90.2  PLT 226   Basic Metabolic Panel: Recent Labs  Lab 11/14/22 0017  NA 135  K 4.1  CL 103  CO2 23  GLUCOSE 118*  BUN 19  CREATININE 1.24  CALCIUM 8.6*   GFR: Estimated Creatinine Clearance: 38.3 mL/min (by C-G formula based on  SCr of 1.24 mg/dL). Liver Function Tests: No results for input(s): "AST", "ALT", "ALKPHOS", "BILITOT", "PROT", "ALBUMIN" in the last 168 hours. No results for input(s): "LIPASE", "AMYLASE" in the last 168 hours. No results for input(s): "AMMONIA" in the last 168 hours. Coagulation Profile: Recent Labs  Lab 11/14/22 0017  INR 1.0   Cardiac Enzymes: No results for input(s): "CKTOTAL", "CKMB", "CKMBINDEX", "TROPONINI" in the last 168 hours. BNP (last 3 results) No results for input(s): "PROBNP" in the last 8760 hours. HbA1C: No results for input(s): "HGBA1C" in the last 72 hours. CBG: No results for input(s): "GLUCAP" in the last 168 hours. Lipid Profile: No results for input(s): "CHOL", "HDL", "LDLCALC", "TRIG", "CHOLHDL", "LDLDIRECT" in the last 72 hours. Thyroid Function Tests: No results for input(s): "TSH", "T4TOTAL", "FREET4", "T3FREE", "THYROIDAB" in the last 72 hours. Anemia Panel: No results for input(s): "VITAMINB12", "FOLATE", "FERRITIN", "TIBC", "IRON", "RETICCTPCT" in the last 72 hours. Urine analysis:    Component Value Date/Time   COLORURINE YELLOW (A) 03/25/2020 1322   APPEARANCEUR HAZY (A) 03/25/2020 1322   LABSPEC 1.044 (H) 03/25/2020 1322   PHURINE 5.0 03/25/2020 1322   GLUCOSEU NEGATIVE 03/25/2020 1322   HGBUR SMALL (A) 03/25/2020 1322   BILIRUBINUR NEGATIVE 03/25/2020 1322   KETONESUR 5 (A) 03/25/2020 1322   PROTEINUR NEGATIVE 03/25/2020 1322   NITRITE NEGATIVE 03/25/2020 1322   LEUKOCYTESUR NEGATIVE 03/25/2020 1322    Radiological Exams on Admission: DG HIP UNILAT WITH PELVIS 2-3 VIEWS LEFT  Result Date: 11/14/2022 CLINICAL DATA:  Fall today with obvious deformity to left leg. Pain from hip to knee. EXAM: DG HIP (WITH OR WITHOUT PELVIS) 2-3V LEFT COMPARISON:  None Available. FINDINGS: Acute comminuted displaced intertrochanteric fracture of the left femur. Slight superolateral displacement of the distal fragment. No dislocation. Degenerative changes  pubic symphysis, both hips, SI joints and lower lumbar spine. IMPRESSION: Comminuted displaced intertrochanteric fracture of the left femur. Electronically Signed   By: Minerva Fester M.D.   On: 11/14/2022 01:12   DG Knee Complete 4 Views Left  Result Date: 11/14/2022 CLINICAL DATA:  fall, left leg is shortened and rotated. complaining of knee pain. eval fx EXAM: LEFT KNEE - COMPLETE 4+ VIEW COMPARISON:  None Available. FINDINGS: No evidence of fracture, dislocation, or joint effusion. Tricompartmental severe degenerative changes of the knee. Soft tissues are unremarkable. Vascular calcifications. IMPRESSION: No acute displaced fracture or dislocation. Electronically Signed   By: Tish Frederickson M.D.   On: 11/14/2022 01:12   DG Chest 1 View  Result Date: 11/14/2022 CLINICAL DATA:  [id number] Fall [id number] EXAM: CHEST  1 VIEW COMPARISON:  Chest x-ray 03/24/2020 FINDINGS: The heart and mediastinal contours are within normal limits. Atherosclerotic plaque. Left base atelectasis. Slightly elevated left hemidiaphragm. No focal consolidation. No  pulmonary edema. No pleural effusion. No pneumothorax. No acute osseous abnormality. IMPRESSION: 1. Left base atelectasis with no active disease. 2.  Aortic Atherosclerosis (ICD10-I70.0). Electronically Signed   By: Tish Frederickson M.D.   On: 11/14/2022 01:04     Data Reviewed: Relevant notes from primary care and specialist visits, past discharge summaries as available in EHR, including Care Everywhere. Prior diagnostic testing as pertinent to current admission diagnoses Updated medications and problem lists for reconciliation ED course, including vitals, labs, imaging, treatment and response to treatment Triage notes, nursing and pharmacy notes and ED provider's notes Notable results as noted in HPI   Assessment and Plan: * Closed left hip fracture, initial encounter (HCC) Accidental fall Patient with history of untreated A-fib and with elevated blood pressure  not on any antihypertensives Patient at low to moderate risk for perioperative cardiopulmonary events Control blood pressure N.p.o. for hip repair on 7/18 Dr. Joice Lofts was consulted from the ED  History of atrial fibrillation Patient was prescribed metoprolol and Eliquis 3 years ago but ran out of refills after he refused to visit the doctors office to get his refills renewed Has followed with Dr. Welton Flakes of [facility] in the past  Primary hypertension Hydralazine IV as needed while n.p.o. Patient is not currently on any medication   Dementia without behavioral disturbance (HCC) History of paranoid thought content According to daughter at bedside, patient has a lot of paranoia and gets up often at night to look out the window thinking that the street lamps from, is trying to look in Delirium precautions     DVT prophylaxis: SCD  Consults: POGGI  Advance Care Planning:   Code Status: Prior   Family Communication: Daughter at bedside  Disposition Plan: Back to previous home environment  Severity of Illness: The appropriate patient status for this patient is INPATIENT. Inpatient status is judged to be reasonable and necessary in order to provide the required intensity of service to ensure the patient's safety. The patient's presenting symptoms, physical exam findings, and initial radiographic and laboratory data in the context of their chronic comorbidities is felt to place them at high risk for further clinical deterioration. Furthermore, it is not anticipated that the patient will be medically stable for discharge from the hospital within 2 midnights of admission.   * I certify that at the point of admission it is my clinical judgment that the patient will require inpatient hospital care spanning beyond 2 midnights from the point of admission due to high intensity of service, high risk for further deterioration and high frequency of surveillance required.*  Author: Andris Baumann,  MD 11/14/2022 1:21 AM  For on call review [email or web address].

## 2022-11-14 NOTE — Anesthesia Procedure Notes (Signed)
Procedure Name: Intubation Date/Time: 11/14/2022 2:44 PM  Performed by: Jeannene Patella, CRNAPre-anesthesia Checklist: Patient identified, Emergency Drugs available, Suction available, Patient being monitored and Timeout performed Patient Re-evaluated:Patient Re-evaluated prior to induction Oxygen Delivery Method: Circle system utilized Preoxygenation: Pre-oxygenation with 100% oxygen Induction Type: IV induction Ventilation: Oral airway inserted - appropriate to patient size and Mask ventilation without difficulty Laryngoscope Size: McGraph and 4 Grade View: Grade II Nasal Tubes: Right Tube size: 7.5 mm Number of attempts: 1 Airway Equipment and Method: Stylet and Video-laryngoscopy Secured at: 21 cm Tube secured with: Tape Dental Injury: Teeth and Oropharynx as per pre-operative assessment

## 2022-11-14 NOTE — ED Notes (Signed)
Family at bedside. 

## 2022-11-14 NOTE — Plan of Care (Signed)
Patient was seen and examined at the bedside, pain is under control with medications, it hurts when he moves otherwise he was resting only.  Patient is AO x 1, has significant dementia, no behavioral issues at present.  Awaiting for orthopedic surgery for ORIF, no any other complaints. Management plan discussed with family at bedside.

## 2022-11-14 NOTE — Assessment & Plan Note (Addendum)
Hydralazine IV as needed while n.p.o. Patient is not currently on any medication

## 2022-11-14 NOTE — Transfer of Care (Signed)
Immediate Anesthesia Transfer of Care Note  Patient: Darryl Haley  Procedure(s) Performed: INTRAMEDULLARY (IM) NAIL INTERTROCHANTERIC (Left: Hip)  Patient Location: PACU  Anesthesia Type:General  Level of Consciousness: drowsy, patient cooperative, and responds to stimulation  Airway & Oxygen Therapy: Patient Spontanous Breathing and Patient connected to face mask oxygen  Post-op Assessment: Report given to RN and Post -op Vital signs reviewed and stable  Post vital signs: Reviewed and stable  Last Vitals:  Vitals Value Taken Time  BP 175/84 11/14/22 1631  Temp    Pulse 102 11/14/22 1635  Resp 18 11/14/22 1635  SpO2 78 % 11/14/22 1635  Vitals shown include unfiled device data.  Last Pain:  Vitals:   11/14/22 1228  TempSrc: Temporal  PainSc: 3          Complications: No notable events documented.

## 2022-11-14 NOTE — TOC CM/SW Note (Signed)
Cm received consult for DME/HH/SNF placement. Cm awaiting for PT/OT eval to assist family and pt with d/c needs. Cm continue to follow.

## 2022-11-14 NOTE — Progress Notes (Signed)
OT Cancellation Note  Patient Details Name: Legacy Lacivita MRN: [id number] DOB: [birth year]   Cancelled Treatment:    Reason Eval/Treat Not Completed: Medical issues which prohibited therapy. OT order received and chart reviewed. Imagining showing comminuted displaced intertrochanteric fracture of the left femur with plans to take pt to OR today. OT will hold evaluation until surgical intervention performed.   Jackquline Denmark, MS, OTR/L , CBIS ascom [phone]  11/14/22, 9:15 AM

## 2022-11-14 NOTE — ED Provider Notes (Signed)
[facility] Provider Note    Event Date/Time   First MD Initiated Contact with Patient 11/14/22 0017     (approximate)   History   Fall   HPI  Darryl Haley is a 89 y.o. male who presents to the ED for evaluation of Fall   Review of medical DC summary from 2021.  Had no prior medical history and not seen physicians for many years.  Came in encephalopathic in the setting of COVID-19 and pneumonia with an AKI and acute PE, A-fib with RVR and discharged with Eliquis, metoprolol and Remeron  Patient presents to the ED from home via EMS for evaluation of a fall and left knee pain.  He got up in the night because he thought he saw something out of the window, did not turn the lights on, tripped over something and complaining of left knee pain.  Takes no regular medications, has not seen a physician since he was here few years ago   Physical Exam   Triage Vital Signs: ED Triage Vitals  Encounter Vitals Group     BP 11/14/22 0015 (!) 185/78     Systolic BP Percentile --      Diastolic BP Percentile --      Pulse Rate 11/14/22 0015 84     Resp 11/14/22 0015 18     Temp 11/14/22 0015 97.8 F (36.6 C)     Temp Source 11/14/22 0015 Oral     SpO2 11/14/22 0015 100 %     Weight 11/14/22 0013 170 lb (77.1 kg)     Height 11/14/22 0013 5\' 8"  (1.727 m)     Head Circumference --      Peak Flow --      Pain Score 11/14/22 0013 3     Pain Loc --      Pain Education --      Exclude from Growth Chart --     Most recent vital signs: Vitals:   11/14/22 0015  BP: (!) 185/78  Pulse: 84  Resp: 18  Temp: 97.8 F (36.6 C)  SpO2: 100%    General: Awake, no distress.  CV:  Good peripheral perfusion.  Resp:  Normal effort.  Abd:  No distention.  MSK:  No deformity noted.  Neuro:  No focal deficits appreciated. Other:  Left leg is shortened and externally rotated   ED Results / Procedures / Treatments   Labs (all labs ordered are listed, but only abnormal  results are displayed) Labs Reviewed  BASIC METABOLIC PANEL - Abnormal; Notable for the following components:      Result Value   Glucose, Bld 118 (*)    Calcium 8.6 (*)    GFR, Estimated 55 (*)    All other components within normal limits  CBC WITH DIFFERENTIAL/PLATELET  PROTIME-INR  APTT  TYPE AND SCREEN    EKG Narrow complex rhythm that appears sinus with a rate of 85 bpm.  No clear ischemic features and seemingly normal intervals  RADIOLOGY CT head interpreted by me without evidence of acute intracranial pathology CXR interpreted by me without evidence of acute cardiopulmonary pathology. Plain film of the pelvis and left hip interpreted by me with a left-sided intertrochanteric fracture  Official radiology report(s): DG Chest 1 View  Result Date: 11/14/2022 CLINICAL DATA:  [id number] Fall [id number] EXAM: CHEST  1 VIEW COMPARISON:  Chest x-ray 03/24/2020 FINDINGS: The heart and mediastinal contours are within normal limits. Atherosclerotic plaque. Left base atelectasis. Slightly elevated  left hemidiaphragm. No focal consolidation. No pulmonary edema. No pleural effusion. No pneumothorax. No acute osseous abnormality. IMPRESSION: 1. Left base atelectasis with no active disease. 2.  Aortic Atherosclerosis (ICD10-I70.0). Electronically Signed   By: Tish Frederickson M.D.   On: 11/14/2022 01:04    PROCEDURES and INTERVENTIONS:  .1-3 Lead EKG Interpretation  Performed by: Delton Prairie, MD Authorized by: Delton Prairie, MD     Interpretation: normal     ECG rate:  80   ECG rate assessment: normal     Rhythm: sinus rhythm     Ectopy: none     Conduction: normal     Medications  ceFAZolin (ANCEF) IVPB 2g/100 mL premix (has no administration in time range)     IMPRESSION / MDM / ASSESSMENT AND PLAN / ED COURSE  I reviewed the triage vital signs and the nursing notes.  Differential diagnosis includes, but is not limited to, left hip fracture, femoral fracture, knee dislocation or  fracture, ICH, syncope  {Patient presents with symptoms of an acute illness or injury that is potentially life-threatening.  Patient presents after a fall with an intertrochanteric left femur fracture requiring admission for fixation.  No signs of additional injuries.  Neurologically intact.  Screening blood work is benign with a normal CBC and metabolic panel.  Clinical Course as of 11/14/22 0110  Thu Nov 14, 2022  0106 I consult orthopedics who will plan for fixation later today (Thursday)  I updated the patient of this, he is initially hesitant to stay its myself and his daughter convinced him that he needs to stay for surgery  I consult hospitalist who agrees to admit [DS]    Clinical Course User Index [DS] Delton Prairie, MD     FINAL CLINICAL IMPRESSION(S) / ED DIAGNOSES   Final diagnoses:  Fall, initial encounter  Closed comminuted intertrochanteric fracture of left femur, initial encounter (HCC)     Rx / DC Orders   ED Discharge Orders     None        Note:  This document was prepared using Dragon voice recognition software and may include unintentional dictation errors.   Delton Prairie, MD 11/14/22 Lyda Jester

## 2022-11-15 ENCOUNTER — Encounter: Payer: Self-pay | Admitting: Surgery

## 2022-11-15 DIAGNOSIS — S72002A Fracture of unspecified part of neck of left femur, initial encounter for closed fracture: Secondary | ICD-10-CM | POA: Diagnosis not present

## 2022-11-15 LAB — CBC
HCT: 36.8 % — ABNORMAL LOW (ref 39.0–52.0)
Hemoglobin: 12.7 g/dL — ABNORMAL LOW (ref 13.0–17.0)
MCH: 30.5 pg (ref 26.0–34.0)
MCHC: 34.5 g/dL (ref 30.0–36.0)
MCV: 88.2 fL (ref 80.0–100.0)
Platelets: 181 10*3/uL (ref 150–400)
RBC: 4.17 MIL/uL — ABNORMAL LOW (ref 4.22–5.81)
RDW: 13.3 % (ref 11.5–15.5)
WBC: 17.7 10*3/uL — ABNORMAL HIGH (ref 4.0–10.5)
nRBC: 0 % (ref 0.0–0.2)

## 2022-11-15 LAB — BASIC METABOLIC PANEL
Anion gap: 10 (ref 5–15)
BUN: 17 mg/dL (ref 8–23)
CO2: 22 mmol/L (ref 22–32)
Calcium: 8.2 mg/dL — ABNORMAL LOW (ref 8.9–10.3)
Chloride: 103 mmol/L (ref 98–111)
Creatinine, Ser: 0.99 mg/dL (ref 0.61–1.24)
GFR, Estimated: >60 mL/min (ref >60)
Glucose, Bld: 123 mg/dL — ABNORMAL HIGH (ref 70–99)
Potassium: 4.3 mmol/L (ref 3.5–5.1)
Sodium: 135 mmol/L (ref 135–145)

## 2022-11-15 LAB — PHOSPHORUS: Phosphorus: 2.4 mg/dL — ABNORMAL LOW (ref 2.5–4.6)

## 2022-11-15 LAB — MAGNESIUM: Magnesium: 2.1 mg/dL (ref 1.7–2.4)

## 2022-11-15 MED ORDER — CYANOCOBALAMIN 1000 MCG/ML IJ SOLN
1000.0000 ug | Freq: Every day | INTRAMUSCULAR | Status: AC
  Administered 2022-11-15 – 2022-11-21 (×7): 1000 ug via INTRAMUSCULAR
  Filled 2022-11-15 (×7): qty 1

## 2022-11-15 MED ORDER — TRAZODONE HCL 50 MG PO TABS
ORAL_TABLET | ORAL | Status: AC
  Filled 2022-11-15: qty 1

## 2022-11-15 MED ORDER — HALOPERIDOL LACTATE 5 MG/ML IJ SOLN
1.0000 mg | Freq: Once | INTRAMUSCULAR | Status: AC
  Administered 2022-11-15: 1 mg via INTRAVENOUS
  Filled 2022-11-15: qty 1

## 2022-11-15 MED ORDER — APIXABAN 5 MG PO TABS
5.0000 mg | ORAL_TABLET | Freq: Two times a day (BID) | ORAL | Status: DC
  Administered 2022-11-15 – 2022-11-23 (×17): 5 mg via ORAL
  Filled 2022-11-15 (×16): qty 1

## 2022-11-15 MED ORDER — TRAZODONE HCL 50 MG PO TABS: 25.0000 mg | ORAL_TABLET | Freq: Every evening | ORAL | Status: DC | PRN

## 2022-11-15 MED ORDER — TRAZODONE HCL 50 MG PO TABS
25.0000 mg | ORAL_TABLET | Freq: Every evening | ORAL | Status: DC | PRN
  Administered 2022-11-15: 25 mg via ORAL

## 2022-11-15 MED ORDER — VITAMIN B-12 1000 MCG PO TABS
1000.0000 ug | ORAL_TABLET | Freq: Every day | ORAL | Status: DC
  Administered 2022-11-22 – 2022-11-23 (×2): 1000 ug via ORAL
  Filled 2022-11-15 (×2): qty 1

## 2022-11-15 NOTE — Progress Notes (Signed)
Physical Therapy Treatment Patient Details Name: Darryl Haley MRN: [id number] DOB: 10-30-1929 Today's Date: 11/15/2022   History of Present Illness Pt is a 89 y/o M admitted on 11/14/22 after presenting with c/o a fall. Pt found to have closed L hip fx & is s/p IM nail on 11/14/22. PMH: mild cognitive deficit, a-fib    PT Comments  Pt seen for PT tx with pt received in [facility], family present in room. Pt unable to void/have BM so assisted off toilet. Pt progresses to performing STS with mod assist, step pivot with max assist +2. Pt volitionally then attempts to walk with RW & +2 assist but poor safety awareness & ability to follow cuing for sequencing, technique & proper use of RW. Pt able to perform LAQ 2 sets x 10 reps for strengthening, which is an improvement compared to AM session. Continue to recommend ongoing PT treatment to address strengthening, balance, activity tolerance to progress independence with transfers & gait.     Assistance Recommended at Discharge Frequent or constant Supervision/Assistance  If plan is discharge home, recommend the following:  Can travel by private vehicle    A lot of help with bathing/dressing/bathroom;Two people to help with walking and/or transfers;Direct supervision/assist for medications management;Help with stairs or ramp for entrance;Assist for transportation;Assistance with cooking/housework;Direct supervision/assist for financial management   No  Equipment Recommendations  Rolling walker (2 wheels)    Recommendations for Other Services       Precautions / Restrictions Precautions Precautions: Fall Restrictions Weight Bearing Restrictions: Yes LLE Weight Bearing: Weight bearing as tolerated     Mobility  Bed Mobility Overal bed mobility: Needs Assistance Bed Mobility: Supine to Sit     Supine to sit: Min assist, HOB elevated     General bed mobility comments: not tested, pt received & left sitting in recliner    Transfers Overall  transfer level: Needs assistance Equipment used: Rolling walker (2 wheels) Transfers: Sit to/from Stand Sit to Stand: Max assist   Step pivot transfers: Max assist (pt's family member voluntarily provides +2 assist)       General transfer comment: Pt requires multimodal cuing re: stepping & turning to recliner. Pt with decreased weight shifting to LLE to allow increased ease of stepping with RLE 2/2 LLE pain & weakness.    Ambulation/Gait               General Gait Details: Attempted gait with pt able to take ~2 steps forwards with RW & PT & family member voluntarily providing +2 assistance. Pt pushing RW significantly far out in front if him, unable to follow cuing to reposition RW closer to him. Pt with decreased weight shifting LLE to allow stepping with RLE 2/2 LLE pain & weakness with weight bearing. PT provides cuing to sit 2/2 safety.   Stairs             Wheelchair Mobility     Tilt Bed    Modified Rankin (Stroke Patients Only)       Balance Overall balance assessment: Needs assistance Sitting-balance support: Feet supported Sitting balance-[name] Scale: Fair     Standing balance support: During functional activity, Bilateral upper extremity supported, Reliant on assistive device for balance Standing balance-[name] Scale: Poor                              Cognition Arousal/Alertness: Awake/alert Behavior During Therapy: WFL for tasks assessed/performed Overall Cognitive Status: History of  cognitive impairments - at baseline                                 General Comments: Pt HOH which also limits session. Pt internally distracted, poor memory/recall, unable to recall PT educating him on fall, hip fx & sx during session (forgets within minutes), decreased ability to follow simple commands throughout session.        Exercises General Exercises - Lower Extremity Ankle Circles/Pumps: AROM, Supine, 10 reps, Left Quad Sets:  AROM, Supine, Strengthening, Left, 10 reps Gluteal Sets:  (pt unable to coordinate/execute) Short Arc Quad: AROM, Supine, Strengthening, Left, 10 reps Long Arc Quad: AROM, Seated, Strengthening, Left, 20 reps (2 sets x 10 reps) Heel Slides: AROM, Supine, Strengthening, Left, 10 reps Hip ABduction/ADduction: AAROM, Supine, Strengthening, Left, 10 reps (hip abduction slides x 10, hip adduction pillow squeezes x 10) Straight Leg Raises: AAROM, Supine, Strengthening, Left, 10 reps    General Comments        Pertinent Vitals/Pain Pain Assessment Pain Assessment: Faces Faces Pain Scale: Hurts whole lot Pain Location: distal L femur with movement Pain Descriptors / Indicators: Grimacing, Guarding, Discomfort Pain Intervention(s): Limited activity within patient's tolerance, Monitored during session, Repositioned    Home Living Family/patient expects to be discharged to:: Private residence Living Arrangements: Children Available Help at Discharge: Family;Available 24 hours/day Type of Home: House Home Access: Stairs to enter Entrance Stairs-Rails: Left Entrance Stairs-Number of Steps: 3   Home Layout: One level Home Equipment: Agricultural consultant (2 wheels) Additional Comments: Pt resides with daughter who works from home & can assist pt.    Prior Function            PT Goals (current goals can now be found in the care plan section) Acute Rehab PT Goals Patient Stated Goal: get better PT Goal Formulation: With patient/family Time For Goal Achievement: 11/29/22 Potential to Achieve Goals: Good Progress towards PT goals: Progressing toward goals    Frequency    BID      PT Plan Current plan remains appropriate    Co-evaluation              AM-PAC PT "6 Clicks" Mobility   Outcome Measure  Help needed turning from your back to your side while in a flat bed without using bedrails?: A Little Help needed moving from lying on your back to sitting on the side of a flat  bed without using bedrails?: A Little Help needed moving to and from a bed to a chair (including a wheelchair)?: A Lot Help needed standing up from a chair using your arms (e.g., wheelchair or bedside chair)?: A Lot Help needed to walk in hospital room?: Total Help needed climbing 3-5 steps with a railing? : Total 6 Click Score: 12    End of Session Equipment Utilized During Treatment: Gait belt Activity Tolerance: Patient tolerated treatment well Patient left: in chair;with chair alarm set;with call bell/phone within reach;with family/visitor present (set up with lunch tray) Nurse Communication: Mobility status PT Visit Diagnosis: Muscle weakness (generalized) (M62.81);Pain;Unsteadiness on feet (R26.81);Difficulty in walking, not elsewhere classified (R26.2) Pain - Right/Left: Left Pain - part of body: Leg     Time: 8119-1478 PT Time Calculation (min) (ACUTE ONLY): 15 min  Charges:    $Therapeutic Exercise: 8-22 mins $Therapeutic Activity: 8-22 mins PT General Charges $$ ACUTE PT VISIT: 1 Visit  Aleda Grana, PT, DPT 11/15/22, 2:12 PM    Sandi Mariscal 11/15/2022, 2:10 PM

## 2022-11-15 NOTE — Plan of Care (Signed)
  Problem: Education: Goal: Knowledge of General Education information will improve Description: Including pain rating scale, medication(s)/side effects and non-pharmacologic comfort measures Outcome: Progressing   Problem: Activity: Goal: Risk for activity intolerance will decrease Outcome: Progressing   Problem: Nutrition: Goal: Adequate nutrition will be maintained Outcome: Progressing   Problem: Coping: Goal: Level of anxiety will decrease Outcome: Progressing   Problem: Pain Managment: Goal: General experience of comfort will improve Outcome: Progressing   Problem: Skin Integrity: Goal: Risk for impaired skin integrity will decrease Outcome: Progressing   

## 2022-11-15 NOTE — Evaluation (Signed)
Occupational Therapy Evaluation Patient Details Name: Darryl Haley MRN: [id number] DOB: 1929/11/24 Today's Date: 11/15/2022   History of Present Illness Pt is a 89 y/o M admitted on 11/14/22 after presenting with c/o a fall. Pt found to have closed L hip fx & is s/p IM nail on 11/14/22. PMH: mild cognitive deficit, a-fib   Clinical Impression   Prior to admission pt had dementia although was independent in performing BADL, ambulating without AD, living with daughter who works from home during the day. On this date, pt is oriented to self only, is generally unable to follow 1-step directions, has no recollection that he has had a fx and sx. He requires Max A +2 for transferring to/from recliner [facility] and for peri hygiene. Pt resists any WBing on L LE.  Will continue to follow pt acutely to progress fxl mobility.     Recommendations for follow up therapy are one component of a multi-disciplinary discharge planning process, led by the attending physician.  Recommendations may be updated based on patient status, additional functional criteria and insurance authorization.   Assistance Recommended at Discharge Frequent or constant Supervision/Assistance  Patient can return home with the following A lot of help with walking and/or transfers;A lot of help with bathing/dressing/bathroom;Assistance with cooking/housework;Direct supervision/assist for medications management;Assist for transportation;Help with stairs or ramp for entrance    Functional Status Assessment  Patient has had a recent decline in their functional status and demonstrates the ability to make significant improvements in function in a reasonable and predictable amount of time.  Equipment Recommendations  None recommended by OT    Recommendations for Other Services       Precautions / Restrictions Precautions Precautions: Fall Restrictions Weight Bearing Restrictions: Yes LLE Weight Bearing: Weight bearing as tolerated       Mobility Bed Mobility               General bed mobility comments: NT - Pt received/left in recliner    Transfers Overall transfer level: Needs assistance Equipment used: Rolling walker (2 wheels) Transfers: Sit to/from Stand, Bed to chair/wheelchair/BSC Sit to Stand: Max assist, +2 physical assistance     Step pivot transfers: Max assist, +2 physical assistance     General transfer comment: Pt unable to follow verbal commands re: hand placement on RW, weight shifting, stepping      Balance Overall balance assessment: Needs assistance Sitting-balance support: Feet supported Sitting balance-[name] Scale: Fair     Standing balance support: During functional activity, Bilateral upper extremity supported, Reliant on assistive device for balance Standing balance-[name] Scale: Poor                             ADL either performed or assessed with clinical judgement   ADL Overall ADL's : Needs assistance/impaired                         Toilet Transfer: +2 for physical assistance;Maximal assistance;Rolling walker (2 wheels);BSC/3in1   Toileting- Clothing Manipulation and Hygiene: +2 for physical assistance;Maximal assistance;Sit to/from stand               Vision         Perception     Praxis      Pertinent Vitals/Pain Pain Assessment Pain Assessment: Faces Pain Score: 6  Pain Location: L hip with movement Pain Descriptors / Indicators: Grimacing, Guarding, Discomfort, Sore Pain Intervention(s): Limited activity within patient's tolerance,  Repositioned     Hand Dominance Right   Extremity/Trunk Assessment Upper Extremity Assessment Upper Extremity Assessment: Overall WFL for tasks assessed   Lower Extremity Assessment Lower Extremity Assessment: LLE deficits/detail LLE Deficits / Details: 2-/5 knee extension in sitting       Communication Communication Communication: HOH   Cognition Arousal/Alertness:  Awake/alert Behavior During Therapy: WFL for tasks assessed/performed Overall Cognitive Status: History of cognitive impairments - at baseline                                 General Comments: hx of dementia, oriented to self only (name & birthday but unable to recall age), follows simple commands with extra time, pt with very poor STM     General Comments       Exercises Other Exercises Other Exercises: Educ w/ pt/daughter re: PoC, DC recs, transfer technique. Daughter verbalizes understanding; pt is unable to recall any information provided   Shoulder Instructions      Home Living Family/patient expects to be discharged to:: Private residence Living Arrangements: Children Available Help at Discharge: Family;Available 24 hours/day Type of Home: House Home Access: Stairs to enter [employer] of Steps: 3 Entrance Stairs-Rails: Left Home Layout: One level     Bathroom Shower/Tub: Producer, television/film/video: Handicapped height     Home Equipment: Agricultural consultant (2 wheels)   Additional Comments: Pt resides with daughter who works from home & can assist pt.      Prior Functioning/Environment Prior Level of Function : Needs assist             Mobility Comments: Ambulatory without AD, no other falls in the past 6 months. ADLs Comments: Pt is able to perform most BADL w/ Mod I. Requires set-up prior to dressing. Daughter assists with all IADL.        OT Problem List: Decreased strength;Decreased range of motion;Decreased activity tolerance;Impaired balance (sitting and/or standing);Decreased coordination;Decreased cognition;Pain;Decreased knowledge of use of DME or AE      OT Treatment/Interventions: Self-care/ADL training;Balance training;Therapeutic exercise;DME and/or AE instruction;Therapeutic activities;Patient/family education    OT Goals(Current goals can be found in the care plan section) Acute Rehab OT Goals Patient Stated Goal:  Per daughter -- goal is to get him back up and moving again so that he can come back home with her OT Goal Formulation: With family Time For Goal Achievement: 11/29/22 Potential to Achieve Goals: Good ADL Goals Pt Will Perform Lower Body Dressing: with supervision;sitting/lateral leans Pt Will Transfer to Toilet: with supervision;ambulating;regular height toilet;stand pivot transfer Pt Will Perform Tub/Shower Transfer: with supervision;ambulating  OT Frequency: Min 2X/week    Co-evaluation              AM-PAC OT "6 Clicks" Daily Activity     Outcome Measure Help from another person eating meals?: A Little Help from another person taking care of personal grooming?: A Little Help from another person toileting, which includes using toliet, bedpan, or urinal?: A Lot Help from another person bathing (including washing, rinsing, drying)?: A Lot Help from another person to put on and taking off regular upper body clothing?: A Little Help from another person to put on and taking off regular lower body clothing?: A Lot 6 Click Score: 15   End of Session Equipment Utilized During Treatment: Rolling walker (2 wheels)  Activity Tolerance: Patient tolerated treatment well;Patient limited by pain Patient left: in chair;with nursing/sitter in  room;with family/visitor present  OT Visit Diagnosis: Unsteadiness on feet (R26.81);Other abnormalities of gait and mobility (R26.89);Pain Pain - Right/Left: Left Pain - part of body: Hip                Time: 2536-6440 OT Time Calculation (min): 29 min Charges:  OT General Charges $OT Visit: 1 Visit OT Evaluation $OT Eval Low Complexity: 1 Low OT Treatments $Self Care/Home Management : 8-22 mins Latina Craver, PhD, MS, OTR/L 11/15/22, 1:17 PM

## 2022-11-15 NOTE — Anesthesia Postprocedure Evaluation (Signed)
Anesthesia Post Note  Patient: Darryl Haley  Procedure(s) Performed: INTRAMEDULLARY (IM) NAIL INTERTROCHANTERIC (Left: Hip)  Patient location during evaluation: Nursing Unit Anesthesia Type: Spinal Level of consciousness: oriented and awake and alert Pain management: pain level controlled Vital Signs Assessment: post-procedure vital signs reviewed and stable Respiratory status: spontaneous breathing, respiratory function stable and patient connected to nasal cannula oxygen Cardiovascular status: blood pressure returned to baseline and stable Postop Assessment: no headache, no backache and no apparent nausea or vomiting Anesthetic complications: no   No notable events documented.   Last Vitals:  Vitals:   11/14/22 2240 11/15/22 0345  BP: 118/68 136/75  Pulse: 89 78  Resp: 18   Temp: 36.9 C 36.4 C  SpO2: 98% 97%    Last Pain:  Vitals:   11/15/22 0345  TempSrc: Oral  PainSc:                  Corinda Gubler

## 2022-11-15 NOTE — Progress Notes (Addendum)
  Subjective: 1 Day Post-Op Procedure(s) (LRB): INTRAMEDULLARY (IM) NAIL INTERTROCHANTERIC (Left) Patient reports pain as mild.   Patient is well, and has had no acute complaints or problems PT and care management to assist with discharge planning. Currently lives at home with family. Negative for chest pain and shortness of breath Fever: no Gastrointestinal:Negative for nausea and vomiting Reports he is passing gas this morning.  Objective: Vital signs in last 24 hours: Temp:  [97.6 F (36.4 C)-99.1 F (37.3 C)] 97.6 F (36.4 C) (07/19 0345) Pulse Rate:  [77-100] 78 (07/19 0345) Resp:  [11-25] 18 (07/18 2240) BP: (118-175)/(68-103) 136/75 (07/19 0345) SpO2:  [94 %-100 %] 97 % (07/19 0345)  Intake/Output from previous day:  Intake/Output Summary (Last 24 hours) at 11/15/2022 0729 Last data filed at 11/15/2022 0314 Gross per 24 hour  Intake 1455.17 ml  Output 700 ml  Net 755.17 ml    Intake/Output this shift: No intake/output data recorded.  Labs: Recent Labs    11/14/22 0017  HGB 15.4   Recent Labs    11/14/22 0017  WBC 9.9  RBC 5.18  HCT 46.7  PLT 226   Recent Labs    11/14/22 0017  NA 135  K 4.1  CL 103  CO2 23  BUN 19  CREATININE 1.24  GLUCOSE 118*  CALCIUM 8.6*   Recent Labs    11/14/22 0017  INR 1.0   EXAM General - Patient is Alert and Appropriate.  He remembers that he had surgery and that he is in the hospital. Extremity - ABD soft Neurovascular intact Dorsiflexion/Plantar flexion intact Incision: dressing C/D/I No cellulitis present Compartment soft Dressing/Incision - clean, dry, no drainage to the left hip honeycomb dressings. Motor Function - intact, moving foot and toes well on exam.  Abdomen soft with intact bowel sounds.  History reviewed. No pertinent past medical history.  Assessment/Plan: 1 Day Post-Op Procedure(s) (LRB): INTRAMEDULLARY (IM) NAIL INTERTROCHANTERIC (Left) Principal Problem:   Closed left hip fracture,  initial encounter (HCC) Active Problems:   Accidental fall   History of atrial fibrillation   Primary hypertension   Dementia without behavioral disturbance (HCC)  Estimated body mass index is 25.85 kg/m as calculated from the following:   Height as of this encounter: 5\' 8"  (1.727 m).   Weight as of this encounter: 77.1 kg. Advance diet Up with therapy D/C IV fluids when tolerating po intake.  Vitals reviewed this AM.  Labs pending. Patient is passing gas without pain this morning. Up with therapy today.  May need SNF following discharge.  Following discharge, follow-up with [facility] orthopaedics in 10-14 days for x-rays and staple removal. Resume home dose of Eliquis at this time.  DVT Prophylaxis - TED hose and Eliquis Weight-Bearing as tolerated to left leg  J. Horris Latino, PA-C [facility] Orthopa[facility] 11/15/2022, 7:29 AM

## 2022-11-15 NOTE — Progress Notes (Addendum)
[facility] Progress Note  Patient: Darryl Haley    WGN:[id number]  DOA: 11/14/2022     Date of Service: the patient was seen and examined on 11/15/2022  Chief Complaint  Patient presents with   Fall   Brief hospital course: Gifford Ballon is a 89 y.o. male with medical history significant for Mild cognitive deficit, hospitalized in 2021 under IVC for confusion and paranoid ideation as well as new onset rapid A-fib in the setting of acute COVID-pneumonia with acute PE, who still lives independently but whose daughter checks in on him frequently who presents to the ED following a fall.  ED course and data review: BP 185/78 with otherwise normal vitals.  Labs including CBC, BMP and INR for the most part unremarkable. EKG reviewed and interpreted appears to be A-fib with controlled rate in the 80s Chest x-ray nonacute Left knee x-ray nonacute Left hip showing comminuted displaced intertrochanteric fracture of the left femur The ED provider spoke with Dr. Joice Lofts who will take patient to the OR later today. Hospitalist consulted for admission.  Assessment and Plan:  # Closed Left hip intertrochanteric fracture due to mechanical Fall S/p Reduction and internal fixation of displaced intertrochanteric left hip fracture with [employer] TFN nail.  Continue pain meds F/u Ortho, PT/OT for placement  History of atrial fibrillation Patient was prescribed metoprolol and Eliquis 3 years ago but ran out of refills after he refused to visit the doctors office to get his refills renewed Has followed with Dr. Welton Flakes of [facility] in the past   Primary hypertension Hydralazine IV as needed while n.p.o. Patient is not currently on any medication    Dementia without behavioral disturbance (HCC) History of paranoid thought content According to daughter at bedside, patient has a lot of paranoia and gets up often at night to look out the window thinking that the street lamps from, is trying to look  in Delirium precautions  Vitamin D deficiency: started vitamin D 50,000 units p.o. weekly, follow with PCP to repeat vitamin D level after 3 to 6 months.  Vitamin B12 deficiency: Started vitamin B12 1000 mcg IM injection daily during hospital stay, followed by oral supplement.  Follow-up PCP to repeat vitamin B12 level after 3 to 6 months.   Body mass index is 25.85 kg/m.  Nutrition Problem: Increased nutrient needs Etiology: hip fracture Interventions:  Diet: Heart healthy DVT Prophylaxis: Therapeutic Anticoagulation with Eliquis     Advance goals of care discussion: Full code  Family Communication: family was present at bedside, at the time of interview.  The pt provided permission to discuss medical plan with the family. Opportunity was given to ask question and all questions were answered satisfactorily.   Disposition:  Pt is from Home, admitted with fall and Left femur fracture s/p ORIF, need to be seen by PT/OT, most likely will need SNF placement, which precludes a safe discharge. Discharge to SNF TBD, stable to dc when bed will be available..  Subjective: no overnight issues, pt was sitting comfortably on the recliner, no pain at rest but it hurts with movement and walking. Denies any other complaints     Physical Exam: General: NAD, lying comfortably Appear in no distress, affect appropriate Eyes: PERRLA ENT: Oral Mucosa Clear, moist  Neck: no JVD,  Cardiovascular: S1 and S2 Present, no Murmur,  Respiratory: good respiratory effort, Bilateral Air entry equal and Decreased, no Crackles, no wheezes Abdomen: Bowel Sound present, Soft and no tenderness,  Skin: no rashes Extremities: no Pedal  edema, no calf tenderness, Left Hip dressing CDI Neurologic: without any new focal findings Gait not checked due to patient safety concerns  Vitals:   11/14/22 1700 11/14/22 1737 11/14/22 2240 11/15/22 0345  BP: 139/82 (!) 142/79 118/68 136/75  Pulse: 77 82 89 78  Resp: 13 16 18     Temp: 97.7 F (36.5 C) 98.7 F (37.1 C) 98.5 F (36.9 C) 97.6 F (36.4 C)  TempSrc:  Oral  Oral  SpO2: 98% 94% 98% 97%  Weight:      Height:        Intake/Output Summary (Last 24 hours) at 11/15/2022 1337 Last data filed at 11/15/2022 0314 Gross per 24 hour  Intake 1455.17 ml  Output 700 ml  Net 755.17 ml   Filed Weights   11/14/22 0013  Weight: 77.1 kg    Data Reviewed: I have personally reviewed and interpreted daily labs, tele strips, imagings as discussed above. I reviewed all nursing notes, pharmacy notes, vitals, pertinent old records I have discussed plan of care as described above with RN and patient/family.  CBC: Recent Labs  Lab 11/14/22 0017 11/15/22 0849  WBC 9.9 17.7*  NEUTROABS 6.9  --   HGB 15.4 12.7*  HCT 46.7 36.8*  MCV 90.2 88.2  PLT 226 181   Basic Metabolic Panel: Recent Labs  Lab 11/14/22 0017 11/15/22 0849  NA 135 135  K 4.1 4.3  CL 103 103  CO2 23 22  GLUCOSE 118* 123*  BUN 19 17  CREATININE 1.24 0.99  CALCIUM 8.6* 8.2*  MG 2.2 2.1  PHOS 2.7 2.4*    Studies: DG FEMUR MIN 2 VIEWS LEFT  Result Date: 11/14/2022 CLINICAL DATA:  Fluoroscopic assistance for internal fixation EXAM: LEFT FEMUR 2 VIEWS COMPARISON:  Study done earlier today FINDINGS: Fluoroscopic images show reduction and internal fixation of comminuted fracture of the proximal left femur. The intramedullary rod and surgical screw in the neck are noted. Fluoroscopic time 1 minutes and 30 seconds. Radiation dose 13.68 mGy. IMPRESSION: Fluoroscopic assistance was provided for internal fixation of intertrochanteric fracture of left femur. Electronically Signed   By: Ernie Avena M.D.   On: 11/14/2022 16:19   DG C-Arm 1-60 Min-No Report  Result Date: 11/14/2022 Fluoroscopy was utilized by the requesting physician.  No radiographic interpretation.    Scheduled Meds:  acetaminophen  500 mg Oral Q6H   apixaban  5 mg Oral BID   cyanocobalamin  1,000 mcg Intramuscular  Daily   Followed by   Melene Muller ON 11/22/2022] vitamin B-12  1,000 mcg Oral Daily   docusate sodium  100 mg Oral BID   feeding supplement  237 mL Oral BID BM   multivitamin with minerals  1 tablet Oral Daily   Vitamin D (Ergocalciferol)  50,000 Units Oral Q7 days   Continuous Infusions:  sodium chloride 75 mL/hr at 11/14/22 1843   methocarbamol (ROBAXIN) IV     PRN Meds: acetaminophen, bisacodyl, diphenhydrAMINE, hydrALAZINE, HYDROcodone-acetaminophen, magnesium hydroxide, methocarbamol **OR** methocarbamol (ROBAXIN) IV, metoCLOPramide **OR** metoCLOPramide (REGLAN) injection, morphine injection, ondansetron **OR** ondansetron (ZOFRAN) IV, sodium phosphate  Time spent: 55 minutes  Author: Gillis Santa. MD [facility] 11/15/2022 1:37 PM  To reach On-call, see care teams to locate the attending and reach out to them via [email or web address]. If 7PM-7AM, please contact night-coverage If you still have difficulty reaching the attending provider, please page the [facility] (Director on Call) for [facility] on amion for assistance.

## 2022-11-15 NOTE — NC FL2 (Signed)
[place] MEDICAID FL2 LEVEL OF CARE FORM     IDENTIFICATION  Patient Name: Darryl Haley Birthdate: [birth year] Sex: male Admission Date (Current Location): 11/14/2022  [facility] and [place] Number:  Chiropodist and Address:  [facility] [address], [address], [place], [place] [address]      Provider Number: [id number]  Attending Physician Name and Address:  Gillis Santa, MD  Relative Name and Phone Number:  Othella Boyer [phone]    Current Level of Care: Hospital Recommended Level of Care: Skilled Nursing Facility Prior Approval Number:    Date Approved/Denied:   PASRR Number: [id number]  Discharge Plan: SNF    Current Diagnoses: Patient Active Problem List   Diagnosis Date Noted   Closed left hip fracture, initial encounter (HCC) 11/14/2022   Accidental fall 11/14/2022   History of atrial fibrillation 11/14/2022   Primary hypertension 11/14/2022   Dementia without behavioral disturbance (HCC) 11/14/2022   Protein-calorie malnutrition, severe 03/27/2020   Pneumonia due to COVID-19 virus 03/24/2020   AKI (acute kidney injury) (HCC) 03/24/2020   Elevated troponin 03/24/2020   Rapid atrial fibrillation, new onset (HCC) 03/24/2020   Inadequate oral nutritional intake 03/24/2020   Acute metabolic encephalopathy 03/24/2020   Unintentional weight loss 03/24/2020   Elevated blood-pressure reading without prior diagnosis of hypertension 03/24/2020    Orientation RESPIRATION BLADDER Height & Weight     Self, Place  Normal Continent, External catheter Weight: 77.1 kg Height:  5\' 8"  (172.7 cm)  BEHAVIORAL SYMPTOMS/MOOD NEUROLOGICAL BOWEL NUTRITION STATUS      Continent Diet (See DC summary)  AMBULATORY STATUS COMMUNICATION OF NEEDS Skin   Extensive Assist Verbally Surgical wounds                       Personal Care Assistance Level of Assistance  Bathing, Feeding, Dressing Bathing Assistance: Maximum assistance Feeding  assistance: Limited assistance Dressing Assistance: Maximum assistance     Functional Limitations Info  Sight, Hearing, Speech Sight Info: Adequate Hearing Info: Impaired Speech Info: Adequate    SPECIAL CARE FACTORS FREQUENCY  PT (By licensed PT), OT (By licensed OT)                    Contractures Contractures Info: Not present    Additional Factors Info  Code Status, Allergies Code Status Info: Full code Allergies Info: NKDA           Current Medications (11/15/2022):  This is the current hospital active medication list Current Facility-Administered Medications  Medication Dose Route Frequency Provider Last Rate Last Admin   0.9 %  sodium chloride infusion   Intravenous Continuous Poggi, Excell Seltzer, MD 75 mL/hr at 11/14/22 1843 Infusion Verify at 11/14/22 1843   acetaminophen (TYLENOL) tablet 325-650 mg  325-650 mg Oral Q6H PRN Poggi, Excell Seltzer, MD       acetaminophen (TYLENOL) tablet 500 mg  500 mg Oral Q6H Poggi, Excell Seltzer, MD   500 mg at 11/15/22 0900   apixaban (ELIQUIS) tablet 5 mg  5 mg Oral BID Anson [place], PA-C   5 mg at 11/15/22 1145   bisacodyl (DULCOLAX) suppository 10 mg  10 mg Rectal Daily PRN Poggi, Excell Seltzer, MD       cyanocobalamin (VITAMIN B12) injection 1,000 mcg  1,000 mcg Intramuscular Daily Gillis Santa, MD   1,000 mcg at 11/15/22 1146   Followed by   Melene Muller ON 11/22/2022] cyanocobalamin (VITAMIN B12) tablet 1,000 mcg  1,000 mcg Oral  Daily Gillis Santa, MD       diphenhydrAMINE (BENADRYL) 12.5 MG/5ML elixir 12.5-25 mg  12.5-25 mg Oral Q4H PRN Poggi, Excell Seltzer, MD       docusate sodium (COLACE) capsule 100 mg  100 mg Oral BID Poggi, Excell Seltzer, MD   100 mg at 11/15/22 1114   feeding supplement (ENSURE ENLIVE / ENSURE PLUS) liquid 237 mL  237 mL Oral BID BM Gillis Santa, MD   237 mL at 11/15/22 1156   hydrALAZINE (APRESOLINE) injection 10 mg  10 mg Intravenous Q4H PRN Poggi, Excell Seltzer, MD       HYDROcodone-acetaminophen (NORCO/VICODIN) 5-325 MG per tablet 1-2  tablet  1-2 tablet Oral Q6H PRN Poggi, Excell Seltzer, MD       magnesium hydroxide (MILK OF MAGNESIA) suspension 30 mL  30 mL Oral Daily PRN Poggi, Excell Seltzer, MD       methocarbamol (ROBAXIN) tablet 500 mg  500 mg Oral Q6H PRN Poggi, Excell Seltzer, MD   500 mg at 11/14/22 2216   Or   methocarbamol (ROBAXIN) 500 mg in dextrose 5 % 50 mL IVPB  500 mg Intravenous Q6H PRN Poggi, Excell Seltzer, MD       metoCLOPramide (REGLAN) tablet 5-10 mg  5-10 mg Oral Q8H PRN Poggi, Excell Seltzer, MD       Or   metoCLOPramide (REGLAN) injection 5-10 mg  5-10 mg Intravenous Q8H PRN Poggi, Excell Seltzer, MD       morphine (PF) 2 MG/ML injection 0.5 mg  0.5 mg Intravenous Q2H PRN Poggi, Excell Seltzer, MD   0.5 mg at 11/14/22 1143   multivitamin with minerals tablet 1 tablet  1 tablet Oral Daily Gillis Santa, MD   1 tablet at 11/15/22 1114   ondansetron (ZOFRAN) tablet 4 mg  4 mg Oral Q6H PRN Poggi, Excell Seltzer, MD       Or   ondansetron (ZOFRAN) injection 4 mg  4 mg Intravenous Q6H PRN Poggi, Excell Seltzer, MD       sodium phosphate (FLEET) 7-19 GM/118ML enema 1 enema  1 enema Rectal Once PRN Poggi, Excell Seltzer, MD       Vitamin D (Ergocalciferol) (DRISDOL) 1.25 MG (50000 UNIT) capsule 50,000 Units  50,000 Units Oral Q7 days Gillis Santa, MD   50,000 Units at 11/15/22 1144     Discharge Medications: Please see discharge summary for a list of discharge medications.  Relevant Imaging Results:  Relevant Lab Results:   Additional Information SS: [id number]  Marlowe Sax, RN

## 2022-11-15 NOTE — Evaluation (Signed)
Physical Therapy Evaluation Patient Details Name: Darryl Haley MRN: [id number] DOB: 1930/03/28 Today's Date: 11/15/2022  History of Present Illness  Pt is a 89 y/o M admitted on 11/14/22 after presenting with c/o a fall. Pt found to have closed L hip fx & is s/p IM nail on 11/14/22. PMH: mild cognitive deficit, a-fib  Clinical Impression  Pt seen for PT evaluation with pt agreeable, daughter Darryl Haley) present for session. Prior to admission pt was independent without AD, living with daughter who works from home during the day. On this date, pt is oriented to self only but follows simple commands throughout session. Provided pt with HEP & pt performed LLE strengthening exercises with cuing for technique, AAROM. Pt is able to complete bed mobility with min assist to move LLE to EOB. Pt requires max assist for STS from EOB with RW & max assist for step pivot bed>recliner. Pt with decreased ability to weight shift to LLE during stepping 2/2 pain & weakness; would benefit from +2 chair follow for safe gait attempts. Will continue to follow pt acutely to progress mobility as able.        Assistance Recommended at Discharge Frequent or constant Supervision/Assistance  If plan is discharge home, recommend the following:  Can travel by private vehicle  A lot of help with bathing/dressing/bathroom;Two people to help with walking and/or transfers;Direct supervision/assist for medications management;Help with stairs or ramp for entrance;Assist for transportation;Assistance with cooking/housework;Direct supervision/assist for financial management   No    Equipment Recommendations Rolling walker (2 wheels)  Recommendations for Other Services       Functional Status Assessment Patient has had a recent decline in their functional status and demonstrates the ability to make significant improvements in function in a reasonable and predictable amount of time.     Precautions / Restrictions  Precautions Precautions: Fall Restrictions Weight Bearing Restrictions: Yes LLE Weight Bearing: Weight bearing as tolerated      Mobility  Bed Mobility Overal bed mobility: Needs Assistance Bed Mobility: Supine to Sit     Supine to sit: Min assist, HOB elevated     General bed mobility comments: use of bed rails with extra time    Transfers Overall transfer level: Needs assistance Equipment used: Rolling walker (2 wheels) Transfers: Sit to/from Stand, Bed to chair/wheelchair/BSC Sit to Stand: Max assist, From elevated surface   Step pivot transfers: Max assist       General transfer comment: Education re: hand placement during STS. Step pivot bed>recliner with RW & max assist, pt with decreased weight shift to LLE 2/2 pain & weakness.    Ambulation/Gait                  Stairs            Wheelchair Mobility     Tilt Bed    Modified Rankin (Stroke Patients Only)       Balance Overall balance assessment: Needs assistance Sitting-balance support: Feet supported Sitting balance-[name] Scale: Fair     Standing balance support: During functional activity, Bilateral upper extremity supported, Reliant on assistive device for balance Standing balance-[name] Scale: Poor                               Pertinent Vitals/Pain Pain Assessment Pain Assessment: Faces Faces Pain Scale: Hurts even more Pain Location: L hip with movement Pain Descriptors / Indicators: Grimacing, Guarding, Discomfort, Sore Pain Intervention(s): Limited activity within patient's  tolerance, Monitored during session, Repositioned    Home Living Family/patient expects to be discharged to:: Private residence Living Arrangements: Children Available Help at Discharge: Family;Available 24 hours/day Type of Home: House Home Access: Stairs to enter Entrance Stairs-Rails: Left Entrance Stairs-Number of Steps: 3   Home Layout: One level Home Equipment: Agricultural consultant  (2 wheels) (not tall enough for pt) Additional Comments: Pt resides with daughter who works from home & can assist pt.    Prior Function Prior Level of Function : Independent/Modified Independent;History of Falls (last six months)             Mobility Comments: Ambulatory without AD, no other falls in the past 6 months. ADLs Comments: Daughter assists with meal prep, cleaning, pt doesn't take meds so no med management.     Hand Dominance        Extremity/Trunk Assessment   Upper Extremity Assessment Upper Extremity Assessment: Overall WFL for tasks assessed    Lower Extremity Assessment Lower Extremity Assessment: LLE deficits/detail LLE Deficits / Details: 2-/5 knee extension in sitting       Communication   Communication: HOH  Cognition Arousal/Alertness: Awake/alert Behavior During Therapy: WFL for tasks assessed/performed Overall Cognitive Status: History of cognitive impairments - at baseline                                 General Comments: hx of dementia, oriented to self only (name & birthday but unable to recall age), follows simple commands with extra time, pt with very poor memory & doesn't recall PT educating him on falling & breaking hip & now s/p sx        General Comments      Exercises General Exercises - Lower Extremity Ankle Circles/Pumps: AROM, Supine, 10 reps, Left Quad Sets: AROM, Supine, Strengthening, Left, 10 reps Gluteal Sets:  (pt unable to coordinate/execute) Short Arc Quad: AROM, Supine, Strengthening, Left, 10 reps Heel Slides: AROM, Supine, Strengthening, Left, 10 reps Hip ABduction/ADduction: AAROM, Supine, Strengthening, Left, 10 reps (hip abduction slides x 10, hip adduction pillow squeezes x 10) Straight Leg Raises: AAROM, Supine, Strengthening, Left, 10 reps   Assessment/Plan    PT Assessment Patient needs continued PT services  PT Problem List Decreased strength;Pain;Decreased range of motion;Decreased  knowledge of use of DME;Decreased activity tolerance;Decreased balance;Decreased safety awareness;Decreased mobility       PT Treatment Interventions DME instruction;Therapeutic exercise;Gait training;Balance training;Neuromuscular re-education;Stair training;Functional mobility training;Patient/family education;Therapeutic activities;Modalities;Manual techniques    PT Goals (Current goals can be found in the Care Plan section)  Acute Rehab PT Goals Patient Stated Goal: get better PT Goal Formulation: With patient/family Time For Goal Achievement: 11/29/22 Potential to Achieve Goals: Good    Frequency BID     Co-evaluation               AM-PAC PT "6 Clicks" Mobility  Outcome Measure Help needed turning from your back to your side while in a flat bed without using bedrails?: A Little Help needed moving from lying on your back to sitting on the side of a flat bed without using bedrails?: A Little Help needed moving to and from a bed to a chair (including a wheelchair)?: Total Help needed standing up from a chair using your arms (e.g., wheelchair or bedside chair)?: Total Help needed to walk in hospital room?: Total Help needed climbing 3-5 steps with a railing? : Total 6 Click Score: 10  End of Session Equipment Utilized During Treatment: Gait belt Activity Tolerance: Patient tolerated treatment well Patient left: in chair;with chair alarm set;with call bell/phone within reach;with family/visitor present Nurse Communication: Mobility status PT Visit Diagnosis: Muscle weakness (generalized) (M62.81);Pain;Unsteadiness on feet (R26.81);Difficulty in walking, not elsewhere classified (R26.2) Pain - Right/Left: Left Pain - part of body: Hip    Time: 1610-9604 PT Time Calculation (min) (ACUTE ONLY): 25 min   Charges:   PT Evaluation $PT Eval Low Complexity: 1 Low PT Treatments $Therapeutic Exercise: 8-22 mins PT General Charges $$ ACUTE PT VISIT: 1 Visit          Aleda Grana, PT, DPT 11/15/22, 11:25 AM   Sandi Mariscal 11/15/2022, 11:23 AM

## 2022-11-15 NOTE — TOC Progression Note (Addendum)
Transition of Care [facility]) - Progression Note    Patient Details  Name: Darryl Haley MRN: [id number] Date of Birth: 1929/10/06  Transition [facility]) CM/SW Contact  Marlowe Sax, RN Phone Number: 11/15/2022, 9:20 AM  Clinical Narrative:     The patient lives at home with his daughter that works from home and  can check on him frequently, he is usually independent.  TOC will assist with DC Planning and needs       Expected Discharge Plan and Services                                               Social Determinants of Health (SDOH) Interventions SDOH Screenings   Tobacco Use: Low Risk  (11/14/2022)    Readmission Risk Interventions     No data to display

## 2022-11-15 NOTE — TOC Progression Note (Signed)
Transition of Care [facility]) - Progression Note    Patient Details  Name: Darryl Haley MRN: [id number] Date of Birth: 09/08/1929  Transition of Care [facility]) CM/SW Contact  Marlowe Sax, RN Phone Number: 11/15/2022, 1:57 PM  Clinical Narrative:    Spoke with Daughter Kathie Rhodes, she is agreeable to a the patient going to [facility] SNF she stated that her mom was at [facility] a couple of times in the past and she would prefer the patient to go there as well, He has a school friend that is a long term resident at [facility] as well, I obtained the PASSR number, completed the [facility] and sent for the bedsearch, I reached out to [place] at [facility] and requested them to review for a bed offer.        Expected Discharge Plan and Services                                               Social Determinants of Health (SDOH) Interventions SDOH Screenings   Tobacco Use: Low Risk  (11/14/2022)    Readmission Risk Interventions     No data to display

## 2022-11-16 DIAGNOSIS — S72002A Fracture of unspecified part of neck of left femur, initial encounter for closed fracture: Secondary | ICD-10-CM | POA: Diagnosis not present

## 2022-11-16 LAB — CBC
HCT: 36.5 % — ABNORMAL LOW (ref 39.0–52.0)
Hemoglobin: 12.5 g/dL — ABNORMAL LOW (ref 13.0–17.0)
MCH: 30.4 pg (ref 26.0–34.0)
MCHC: 34.2 g/dL (ref 30.0–36.0)
MCV: 88.8 fL (ref 80.0–100.0)
Platelets: 235 10*3/uL (ref 150–400)
RBC: 4.11 MIL/uL — ABNORMAL LOW (ref 4.22–5.81)
RDW: 13.2 % (ref 11.5–15.5)
WBC: 17.1 10*3/uL — ABNORMAL HIGH (ref 4.0–10.5)
nRBC: 0 % (ref 0.0–0.2)

## 2022-11-16 LAB — BASIC METABOLIC PANEL
Anion gap: 8 (ref 5–15)
BUN: 21 mg/dL (ref 8–23)
CO2: 25 mmol/L (ref 22–32)
Calcium: 8.2 mg/dL — ABNORMAL LOW (ref 8.9–10.3)
Chloride: 102 mmol/L (ref 98–111)
Creatinine, Ser: 0.98 mg/dL (ref 0.61–1.24)
GFR, Estimated: >60 mL/min (ref >60)
Glucose, Bld: 129 mg/dL — ABNORMAL HIGH (ref 70–99)
Potassium: 3.7 mmol/L (ref 3.5–5.1)
Sodium: 135 mmol/L (ref 135–145)

## 2022-11-16 LAB — PHOSPHORUS: Phosphorus: 1.8 mg/dL — ABNORMAL LOW (ref 2.5–4.6)

## 2022-11-16 LAB — MAGNESIUM: Magnesium: 2.3 mg/dL (ref 1.7–2.4)

## 2022-11-16 MED ORDER — HALOPERIDOL LACTATE 5 MG/ML IJ SOLN
2.0000 mg | Freq: Four times a day (QID) | INTRAMUSCULAR | Status: DC | PRN
  Filled 2022-11-16: qty 1

## 2022-11-16 MED ORDER — QUETIAPINE FUMARATE 25 MG PO TABS
50.0000 mg | ORAL_TABLET | Freq: Two times a day (BID) | ORAL | Status: DC
  Administered 2022-11-16 – 2022-11-17 (×3): 50 mg via ORAL
  Filled 2022-11-16 (×3): qty 2

## 2022-11-16 MED ORDER — POLYETHYLENE GLYCOL 3350 17 G PO PACK
17.0000 g | PACK | Freq: Two times a day (BID) | ORAL | Status: DC
  Administered 2022-11-16 – 2022-11-23 (×8): 17 g via ORAL
  Filled 2022-11-16 (×10): qty 1

## 2022-11-16 MED ORDER — K PHOS MONO-SOD PHOS DI & MONO 155-852-130 MG PO TABS
500.0000 mg | ORAL_TABLET | Freq: Three times a day (TID) | ORAL | Status: AC
  Administered 2022-11-16 – 2022-11-17 (×6): 500 mg via ORAL
  Filled 2022-11-16 (×6): qty 2

## 2022-11-16 MED ORDER — BISACODYL 5 MG PO TBEC
10.0000 mg | DELAYED_RELEASE_TABLET | Freq: Every day | ORAL | Status: DC
  Administered 2022-11-18 – 2022-11-22 (×5): 10 mg via ORAL
  Filled 2022-11-16 (×6): qty 2

## 2022-11-16 MED ORDER — TRAZODONE HCL 50 MG PO TABS
50.0000 mg | ORAL_TABLET | Freq: Every evening | ORAL | Status: DC | PRN
  Administered 2022-11-16 – 2022-11-21 (×2): 50 mg via ORAL
  Filled 2022-11-16 (×3): qty 1

## 2022-11-16 NOTE — Progress Notes (Addendum)
Subjective: 2 Days Post-Op Procedure(s) (LRB): INTRAMEDULLARY (IM) NAIL INTERTROCHANTERIC (Left) Patient reports pain as mild, son-in-law present does state that he had some increased pain while nursing tried to turn the patient onto his left side to change him.  Urged to take pain medication prior to working with PT. Patient is well, and has had no acute complaints or problems PT and care management to assist with discharge planning.  PT arriving with provider in the room to work with patient. Currently lives at home with family. Negative for chest pain and shortness of breath Fever: no fever or chills Gastrointestinal:Negative for nausea and vomiting Reports he is passing gas this morning.  Objective: Vital signs in last 24 hours: Temp:  [98.4 F (36.9 C)] 98.4 F (36.9 C) (07/20 0038) Pulse Rate:  [77-92] 77 (07/20 0038) Resp:  [16-18] 18 (07/20 0038) BP: (103-127)/(57-85) 103/57 (07/20 0038) SpO2:  [96 %-98 %] 96 % (07/20 0038) Weight:  [90.9 kg] 90.9 kg (07/20 0719)  Intake/Output from previous day:  Intake/Output Summary (Last 24 hours) at 11/16/2022 1057 Last data filed at 11/15/2022 2125 Gross per 24 hour  Intake --  Output 500 ml  Net -500 ml    Intake/Output this shift: No intake/output data recorded.  Labs: Recent Labs    11/14/22 0017 11/15/22 0849 11/16/22 0449  HGB 15.4 12.7* 12.5*   Recent Labs    11/15/22 0849 11/16/22 0449  WBC 17.7* 17.1*  RBC 4.17* 4.11*  HCT 36.8* 36.5*  PLT 181 235   Recent Labs    11/15/22 0849 11/16/22 0449  NA 135 135  K 4.3 3.7  CL 103 102  CO2 22 25  BUN 17 21  CREATININE 0.99 0.98  GLUCOSE 123* 129*  CALCIUM 8.2* 8.2*   Recent Labs    11/14/22 0017  INR 1.0   EXAM General - Patient is Alert and Appropriate.  He remembers that he had surgery and that he is in the hospital. Extremity - ABD soft Neurovascular intact Dorsiflexion/Plantar flexion intact Incision: dressing C/D/I No cellulitis  present Compartment soft Dressing/Incision -proximal dressing shows mild dried blood within the honeycomb dressing and Xeroform.  Other middle and distal dressings are clean, dry, no drainage to the left hip honeycomb dressings.  Proximal dressing was changed, reapplied with Xeroform and honeycomb dressing Motor Function - intact, moving foot and toes well on exam.  Patient able to plantar and dorsiflex with good range of motion and strength.  Patient is neurovascularly intact down his left leg.  Posterior tibial pulses appreciated 2+ Abdomen soft with intact bowel sounds.  History reviewed. No pertinent past medical history.  Assessment/Plan: 2 Days Post-Op Procedure(s) (LRB): INTRAMEDULLARY (IM) NAIL INTERTROCHANTERIC (Left) Principal Problem:   Closed left hip fracture, initial encounter (HCC) Active Problems:   Accidental fall   History of atrial fibrillation   Primary hypertension   Dementia without behavioral disturbance (HCC)  Estimated body mass index is 30.47 kg/m as calculated from the following:   Height as of this encounter: 5\' 8"  (1.727 m).   Weight as of this encounter: 90.9 kg. Advance diet Up with therapy D/C IV fluids when tolerating po intake.  Vitals reviewed this AM. Patient is passing gas without pain this morning. Up with therapy today.  Planning to transition to a SNF following discharge. WBAT to left side  Following discharge, follow-up with [facility] orthopaedics in 10-14 days for x-rays and staple removal. Continue home dose of Eliquis at this time.  DVT Prophylaxis - TED  hose and Eliquis Weight-Bearing as tolerated to left leg  Danise Edge, PA-C [facility] 11/16/2022, 10:57 AM

## 2022-11-16 NOTE — Progress Notes (Signed)
       CROSS COVER NOTE  NAME: Darryl Haley MRN: [id number] DOB : 1929/06/29    Concern as stated by nurse / staff   Patient with increased agitation. Has history of dementia POD 1 internal fixation of rleft hip/ Daughter reported the only time patient slept was during  during surgery yesterday      Pertinent findings on chart review: Labs leukocytosis likely reactive, no fever Lytes WNL  Assessment and  Interventions   Assessment: Pain, potty hunger and thirst addressed. Up out of bed twice in the evening for urination. Post trazodone, muscle relaxer and pain treatment, patient alert, agitated, confused to place time and situation. Insisting he needed to go home with ongoing attempts to get out of bed. Unable to redirect. Daughter at bedside reports he took melatonin once in the past that seemed to help him sleep but states he rarely ever has difficulty sleeping, and takes very little over the counter meds. He admits to mild pain in right knee now but says it is alright. Daughter reports use of pain relieving gel on chronic knee arthritis pain   Plan: Haldol 1 mg IV x1 Sitter 1:1 Continue with delirium prevention/treatment interventions       [name] NP [facility] Cross Cover 7pm-7am - check amion for availability Pager [phone]

## 2022-11-16 NOTE — Progress Notes (Signed)
PT Cancellation Note  Patient Details Name: Deanna Boehlke MRN: [id number] DOB: 02-29-1930   Cancelled Treatment:     Therapist in this pm. Pt remains lethargic, responds in brief muffled voice on occasion, however unable to open eyes and falls back asleep. Nursing aware, will re-attempt tomorrow if able to arouse and participate.    Jannet Askew 11/16/2022, 2:18 PM

## 2022-11-16 NOTE — Progress Notes (Signed)
Physical Therapy Treatment Patient Details Name: Darryl Haley MRN: [id number] DOB: [birth year] Today's Date: 11/16/2022   History of Present Illness Pt is a 89 y/o M admitted on 11/14/22 after presenting with c/o a fall. Pt found to have closed L hip fx & is s/p IM nail on 11/14/22. PMH: mild cognitive deficit, a-fib    PT Comments  Therapist in to see pt this am. Pt was anxious last night and unable to calm self down. Multiple sedative meds given, pt finally fell asleep around 8am this morning and currently unable to arouse pt for mobility. Therefore, L LE PROM performed through all available planes with only occasional grimacing, however did not awake. Pt to start Seroquel to help regulate sleep patterns. Will re-attempt in pm    Assistance Recommended at Discharge Frequent or constant Supervision/Assistance  If plan is discharge home, recommend the following:  Can travel by private vehicle    A lot of help with bathing/dressing/bathroom;Two people to help with walking and/or transfers;Direct supervision/assist for medications management;Help with stairs or ramp for entrance;Assist for transportation;Assistance with cooking/housework;Direct supervision/assist for financial management   No  Equipment Recommendations  Rolling walker (2 wheels)    Recommendations for Other Services       Precautions / Restrictions Precautions Precautions: Fall Restrictions Weight Bearing Restrictions: Yes LLE Weight Bearing: Weight bearing as tolerated     Mobility  Bed Mobility               General bed mobility comments:  (deferred)    Transfers                   General transfer comment: deferred    Ambulation/Gait               General Gait Details:  (deferred)   Stairs             Wheelchair Mobility     Tilt Bed    Modified Rankin (Stroke Patients Only)       Balance                                            Cognition  Arousal/Alertness: Lethargic, Suspect due to medications Behavior During Therapy:  (Unable to arouse) Overall Cognitive Status: Impaired/Different from baseline                                          Exercises General Exercises - Lower Extremity Ankle Circles/Pumps: PROM, Left, 20 reps Heel Slides: PROM, Left, 20 reps Hip ABduction/ADduction: PROM, Left, 20 reps Straight Leg Raises: PROM, Left, 5 reps Other Exercises Other Exercises: Gentle L Hip PROM IR/ER x 15 reps    General Comments General comments (skin integrity, edema, etc.): Discussed POC with family and role of PT      Pertinent Vitals/Pain Pain Assessment Pain Assessment: No/denies pain Pain Score: Asleep (Ocassional grimacing with L LE PROM)    Home Living                          Prior Function            PT Goals (current goals can now be found in the care plan section) Acute Rehab PT Goals Patient Stated Goal: get  better    Frequency    BID      PT Plan Current plan remains appropriate    Co-evaluation              AM-PAC PT "6 Clicks" Mobility   Outcome Measure  Help needed turning from your back to your side while in a flat bed without using bedrails?: A Little Help needed moving from lying on your back to sitting on the side of a flat bed without using bedrails?: A Little Help needed moving to and from a bed to a chair (including a wheelchair)?: A Lot Help needed standing up from a chair using your arms (e.g., wheelchair or bedside chair)?: A Lot Help needed to walk in hospital room?: Total Help needed climbing 3-5 steps with a railing? : Total 6 Click Score: 12    End of Session   Activity Tolerance: Patient limited by lethargy;Other (comment) (Multiple sedative meds given at 12am causing significant lethargy) Patient left: in bed;with call bell/phone within reach;with family/visitor present;with SCD's reapplied Nurse Communication: Other (comment)  (Sedative state, nursing aware) PT Visit Diagnosis: Muscle weakness (generalized) (M62.81);Pain;Unsteadiness on feet (R26.81);Difficulty in walking, not elsewhere classified (R26.2) Pain - Right/Left: Left Pain - part of body: Leg     Time: 1020-1040 PT Time Calculation (min) (ACUTE ONLY): 20 min  Charges:    $Therapeutic Exercise: 8-22 mins PT General Charges $$ ACUTE PT VISIT: 1 Visit                    Zadie Cleverly, PTA  Jannet Askew 11/16/2022, 2:12 PM

## 2022-11-16 NOTE — TOC Progression Note (Signed)
Transition of Care [facility]) - Progression Note    Patient Details  Name: Darryl Haley MRN: [id number] Date of Birth: Aug 09, 1929  Transition of Care [facility]) CM/SW Contact  Bing Quarry, RN Phone Number: 11/16/2022, 9:47 AM  Clinical Narrative: 11/16/22: Bed offers from [facility] remains pending for search began 11/15/22 with [facility]  choice preference. Per prior CM notes, patient has been at [facility] before and Tiffany at [facility] was contacted directly to review bed offer for patient.  Gabriel Cirri MSN RN CM  Transitions of Care Department [facility] [phone] Weekends Only           Expected Discharge Plan and Services                                               Social Determinants of Health (SDOH) Interventions SDOH Screenings   Tobacco Use: Low Risk  (11/14/2022)    Readmission Risk Interventions     No data to display

## 2022-11-16 NOTE — Plan of Care (Signed)
?  Problem: Education: ?Goal: Knowledge of General Education information will improve ?Description: Including pain rating scale, medication(s)/side effects and non-pharmacologic comfort measures ?Outcome: Progressing ?  ?Problem: Clinical Measurements: ?Goal: Ability to maintain clinical measurements within normal limits will improve ?Outcome: Progressing ?  ?Problem: Nutrition: ?Goal: Adequate nutrition will be maintained ?Outcome: Progressing ?  ?Problem: Coping: ?Goal: Level of anxiety will decrease ?Outcome: Progressing ?  ?Problem: Elimination: ?Goal: Will not experience complications related to bowel motility ?Outcome: Progressing ?  ?

## 2022-11-16 NOTE — Plan of Care (Signed)
  Problem: Health Behavior/Discharge Planning: Goal: Ability to manage health-related needs will improve Outcome: Progressing   Problem: Clinical Measurements: Goal: Ability to maintain clinical measurements within normal limits will improve Outcome: Progressing Goal: Diagnostic test results will improve Outcome: Progressing   Problem: Activity: Goal: Risk for activity intolerance will decrease Outcome: Progressing   Problem: Nutrition: Goal: Adequate nutrition will be maintained Outcome: Progressing   Problem: Coping: Goal: Level of anxiety will decrease Outcome: Progressing   Problem: Elimination: Goal: Will not experience complications related to bowel motility Outcome: Progressing Goal: Will not experience complications related to urinary retention Outcome: Progressing   Problem: Pain Managment: Goal: General experience of comfort will improve Outcome: Progressing   

## 2022-11-16 NOTE — Progress Notes (Addendum)
[facility] Progress Note  Patient: Darryl Haley    BJY:[id number]  DOA: 11/14/2022     Date of Service: the patient was seen and examined on 11/16/2022  Chief Complaint  Patient presents with   Fall   Brief hospital course: Ramil Edgington is a 89 y.o. male with medical history significant for Mild cognitive deficit, hospitalized in 2021 under IVC for confusion and paranoid ideation as well as new onset rapid A-fib in the setting of acute COVID-pneumonia with acute PE, who still lives independently but whose daughter checks in on him frequently who presents to the ED following a fall.  ED course and data review: BP 185/78 with otherwise normal vitals.  Labs including CBC, BMP and INR for the most part unremarkable. EKG reviewed and interpreted appears to be A-fib with controlled rate in the 80s Chest x-ray nonacute Left knee x-ray nonacute Left hip showing comminuted displaced intertrochanteric fracture of the left femur The ED provider spoke with Dr. Joice Lofts who will take patient to the OR later today. Hospitalist consulted for admission.  Assessment and Plan:  # Closed Left hip intertrochanteric fracture due to mechanical Fall S/p Reduction and internal fixation of displaced intertrochanteric left hip fracture with [employer] TFN nail.  Continue pain meds F/u Ortho, PT/OT for placement  # History of atrial fibrillation Patient was prescribed metoprolol and Eliquis 3 years ago but ran out of refills after he refused to visit the doctors office to get his refills renewed Has followed with Dr. Welton Flakes of [facility] in the past Started Eliquis 5 mg p.o. twice daily  # Primary hypertension Hydralazine IV as needed while n.p.o. Patient is not currently on any medication    # Dementia without behavioral disturbance  # History of paranoid thought content According to daughter at bedside, patient has a lot of paranoia and gets up often at night to look out the window thinking that the  street lamps from, is trying to look in Delirium precautions Started Seroquel 50 mg p.o. twice daily, use Haldol as needed  # Vitamin D deficiency: started vitamin D 50,000 units p.o. weekly, follow with PCP to repeat vitamin D level after 3 to 6 months.  # Vitamin B12 deficiency: Started vitamin B12 1000 mcg IM injection daily during hospital stay, followed by oral supplement.  Follow-up PCP to repeat vitamin B12 level after 3 to 6 months.  # Hypophosphatemia, Phos repleted. Monitor electrolytes.    Body mass index is 30.47 kg/m.  Nutrition Problem: Increased nutrient needs Etiology: hip fracture Interventions:  Diet: Heart healthy DVT Prophylaxis: Therapeutic Anticoagulation with Eliquis     Advance goals of care discussion: Full code  Family Communication: family was present at bedside, at the time of interview.  The pt provided permission to discuss medical plan with the family. Opportunity was given to ask question and all questions were answered satisfactorily.   Disposition:  Pt is from Home, admitted with fall and Left femur fracture s/p ORIF, need to be seen by PT/OT, most likely will need SNF placement, which precludes a safe discharge. Discharge to SNF TBD, stable to dc when bed will be available..  Subjective: Overnight patient was agitated, Haldol was given.  In the morning time patient was sleepy, did not wake up the patient. Discussed management plan with family at bedside.   Physical Exam: General: NAD, lying comfortably Appear in no distress, affect appropriate Eyes: PERRLA ENT: Oral Mucosa Clear, moist  Neck: no JVD,  Cardiovascular: S1 and S2 Present,  no Murmur,  Respiratory: good respiratory effort, Bilateral Air entry equal and Decreased, no Crackles, no wheezes Abdomen: Bowel Sound present, Soft and no tenderness,  Skin: no rashes Extremities: no Pedal edema, no calf tenderness, Left Hip dressing CDI Neurologic: without any new focal findings Gait  not checked due to patient safety concerns  Vitals:   11/15/22 1748 11/16/22 0038 11/16/22 0719 11/16/22 1427  BP: 127/85 (!) 103/57  (!) 123/44  Pulse: 92 77  83  Resp: 16 18  16   Temp:  98.4 F (36.9 C)    TempSrc:      SpO2: 98% 96%  98%  Weight:   90.9 kg   Height:        Intake/Output Summary (Last 24 hours) at 11/16/2022 1432 Last data filed at 11/15/2022 2125 Gross per 24 hour  Intake --  Output 500 ml  Net -500 ml   Filed Weights   11/14/22 0013 11/16/22 0719  Weight: 77.1 kg 90.9 kg    Data Reviewed: I have personally reviewed and interpreted daily labs, tele strips, imagings as discussed above. I reviewed all nursing notes, pharmacy notes, vitals, pertinent old records I have discussed plan of care as described above with RN and patient/family.  CBC: Recent Labs  Lab 11/14/22 0017 11/15/22 0849 11/16/22 0449  WBC 9.9 17.7* 17.1*  NEUTROABS 6.9  --   --   HGB 15.4 12.7* 12.5*  HCT 46.7 36.8* 36.5*  MCV 90.2 88.2 88.8  PLT 226 181 235   Basic Metabolic Panel: Recent Labs  Lab 11/14/22 0017 11/15/22 0849 11/16/22 0449  NA 135 135 135  K 4.1 4.3 3.7  CL 103 103 102  CO2 23 22 25   GLUCOSE 118* 123* 129*  BUN 19 17 21   CREATININE 1.24 0.99 0.98  CALCIUM 8.6* 8.2* 8.2*  MG 2.2 2.1 2.3  PHOS 2.7 2.4* 1.8*    Studies: No results found.  Scheduled Meds:  apixaban  5 mg Oral BID   bisacodyl  10 mg Oral QHS   cyanocobalamin  1,000 mcg Intramuscular Daily   Followed by   Melene Muller ON 11/22/2022] vitamin B-12  1,000 mcg Oral Daily   docusate sodium  100 mg Oral BID   feeding supplement  237 mL Oral BID BM   multivitamin with minerals  1 tablet Oral Daily   phosphorus  500 mg Oral TID   polyethylene glycol  17 g Oral BID   QUEtiapine  50 mg Oral BID   Vitamin D (Ergocalciferol)  50,000 Units Oral Q7 days   Continuous Infusions:  sodium chloride 75 mL/hr at 11/14/22 1843   methocarbamol (ROBAXIN) IV     PRN Meds: acetaminophen, bisacodyl,  diphenhydrAMINE, haloperidol lactate, hydrALAZINE, HYDROcodone-acetaminophen, magnesium hydroxide, methocarbamol **OR** methocarbamol (ROBAXIN) IV, metoCLOPramide **OR** metoCLOPramide (REGLAN) injection, morphine injection, ondansetron **OR** ondansetron (ZOFRAN) IV, sodium phosphate, traZODone  Time spent: 40 minutes  Author: Gillis Santa. MD [facility] 11/16/2022 2:32 PM  To reach On-call, see care teams to locate the attending and reach out to them via [email or web address]. If 7PM-7AM, please contact night-coverage If you still have difficulty reaching the attending provider, please page the [facility] (Director on Call) for [facility] on amion for assistance.

## 2022-11-17 DIAGNOSIS — S72002A Fracture of unspecified part of neck of left femur, initial encounter for closed fracture: Secondary | ICD-10-CM | POA: Diagnosis not present

## 2022-11-17 LAB — BASIC METABOLIC PANEL
Anion gap: 6 (ref 5–15)
BUN: 21 mg/dL (ref 8–23)
CO2: 27 mmol/L (ref 22–32)
Calcium: 7.7 mg/dL — ABNORMAL LOW (ref 8.9–10.3)
Chloride: 101 mmol/L (ref 98–111)
Creatinine, Ser: 1.14 mg/dL (ref 0.61–1.24)
GFR, Estimated: >60 mL/min (ref >60)
Glucose, Bld: 116 mg/dL — ABNORMAL HIGH (ref 70–99)
Potassium: 3.9 mmol/L (ref 3.5–5.1)
Sodium: 134 mmol/L — ABNORMAL LOW (ref 135–145)

## 2022-11-17 LAB — CBC
HCT: 32.7 % — ABNORMAL LOW (ref 39.0–52.0)
Hemoglobin: 11.1 g/dL — ABNORMAL LOW (ref 13.0–17.0)
MCH: 30 pg (ref 26.0–34.0)
MCHC: 33.9 g/dL (ref 30.0–36.0)
MCV: 88.4 fL (ref 80.0–100.0)
Platelets: 212 10*3/uL (ref 150–400)
RBC: 3.7 MIL/uL — ABNORMAL LOW (ref 4.22–5.81)
RDW: 13.5 % (ref 11.5–15.5)
WBC: 10.8 10*3/uL — ABNORMAL HIGH (ref 4.0–10.5)
nRBC: 0 % (ref 0.0–0.2)

## 2022-11-17 LAB — MAGNESIUM: Magnesium: 2.2 mg/dL (ref 1.7–2.4)

## 2022-11-17 LAB — PHOSPHORUS: Phosphorus: 2.8 mg/dL (ref 2.5–4.6)

## 2022-11-17 MED ORDER — QUETIAPINE FUMARATE 25 MG PO TABS: 25.0000 mg | ORAL_TABLET | Freq: Every day | ORAL | Status: DC

## 2022-11-17 MED ORDER — QUETIAPINE FUMARATE 25 MG PO TABS
25.0000 mg | ORAL_TABLET | Freq: Every evening | ORAL | Status: DC
  Administered 2022-11-18 – 2022-11-21 (×4): 25 mg via ORAL
  Filled 2022-11-17 (×4): qty 1

## 2022-11-17 NOTE — Progress Notes (Signed)
Physical Therapy Treatment Patient Details Name: Darryl Haley MRN: [id number] DOB: 1929-05-04 Today's Date: 11/17/2022   History of Present Illness Pt is a 89 y/o M admitted on 11/14/22 after presenting with c/o a fall. Pt found to have closed L hip fx & is s/p IM nail on 11/14/22. PMH: mild cognitive deficit, a-fib    PT Comments  Pt sleepy this am but does awake to voice later this pm.  Participated in exercises as described below.  Generally hesitant with ROM but overall does well with encouragement.  To EOB with mod a x 2.  Does attempt to assist as able but limited by pain.  Steady in sitting with supervision.  He does stand on second attempt from elevated bed and mod a x 2.  Care is provided for small soft BM.  After short seated rest on bed, he is able to take several small steps to recliner at bedside.  Remains up with needs met and daughter in room.    Assistance Recommended at Discharge Frequent or constant Supervision/Assistance  If plan is discharge home, recommend the following:  Can travel by private vehicle    A lot of help with bathing/dressing/bathroom;Two people to help with walking and/or transfers;Direct supervision/assist for medications management;Help with stairs or ramp for entrance;Assist for transportation;Assistance with cooking/housework;Direct supervision/assist for financial management      Equipment Recommendations       Recommendations for Other Services       Precautions / Restrictions Precautions Precautions: Fall Restrictions Weight Bearing Restrictions: Yes LLE Weight Bearing: Weight bearing as tolerated     Mobility  Bed Mobility Overal bed mobility: Needs Assistance Bed Mobility: Supine to Sit     Supine to sit: Mod assist, +2 for physical assistance       Patient Response: Cooperative  Transfers Overall transfer level: Needs assistance Equipment used: Rolling walker (2 wheels) Transfers: Sit to/from Stand Sit to Stand: +2 physical  assistance                Ambulation/Gait Ambulation/Gait assistance: Mod assist, Min assist, +2 physical assistance Gait Distance (Feet): 3 Feet Assistive device: Rolling walker (2 wheels) Gait Pattern/deviations: Step-to pattern Gait velocity: decreased     General Gait Details: +2 needed fot mobility but he does take good quality steps this session   Stairs             Wheelchair Mobility     Tilt Bed Tilt Bed Patient Response: Cooperative  Modified Rankin (Stroke Patients Only)       Balance Overall balance assessment: Needs assistance Sitting-balance support: Feet supported Sitting balance-[name] Scale: Fair     Standing balance support: During functional activity, Bilateral upper extremity supported, Reliant on assistive device for balance Standing balance-[name] Scale: Poor Standing balance comment: +2 assist                            Cognition Arousal/Alertness: Awake/alert, Lethargic Behavior During Therapy: WFL for tasks assessed/performed Overall Cognitive Status: Within Functional Limits for tasks assessed                                 General Comments: Pt HOH which also limits session. Pt internally distracted, poor memory/recall, unable to recall PT educating him on fall, hip fx & sx during session (forgets within minutes), decreased ability to follow simple commands throughout session.  Exercises Other Exercises Other Exercises: L hip AA/PROM x 10 supine prior to mobilty    General Comments        Pertinent Vitals/Pain Pain Assessment Pain Assessment: Faces Faces Pain Scale: Hurts even more Pain Location: distal L femur with movement Pain Descriptors / Indicators: Grimacing, Guarding, Discomfort Pain Intervention(s): Limited activity within patient's tolerance, Monitored during session, Repositioned    Home Living                          Prior Function            PT Goals  (current goals can now be found in the care plan section) Progress towards PT goals: Progressing toward goals    Frequency    BID      PT Plan Current plan remains appropriate    Co-evaluation              AM-PAC PT "6 Clicks" Mobility   Outcome Measure  Help needed turning from your back to your side while in a flat bed without using bedrails?: A Little Help needed moving from lying on your back to sitting on the side of a flat bed without using bedrails?: A Lot Help needed moving to and from a bed to a chair (including a wheelchair)?: A Lot Help needed standing up from a chair using your arms (e.g., wheelchair or bedside chair)?: A Lot Help needed to walk in hospital room?: A Lot Help needed climbing 3-5 steps with a railing? : Total 6 Click Score: 12    End of Session Equipment Utilized During Treatment: Gait belt Activity Tolerance: Patient tolerated treatment well Patient left: in chair;with call bell/phone within reach;with chair alarm set Nurse Communication: Other (comment) (small soft BM) PT Visit Diagnosis: Muscle weakness (generalized) (M62.81);Pain;Unsteadiness on feet (R26.81);Difficulty in walking, not elsewhere classified (R26.2) Pain - Right/Left: Left Pain - part of body: Leg     Time: 7829-5621 PT Time Calculation (min) (ACUTE ONLY): 23 min  Charges:    $Therapeutic Exercise: 8-22 mins $Therapeutic Activity: 8-22 mins PT General Charges $$ ACUTE PT VISIT: 1 Visit                   Danielle Dess, PTA 11/17/22, 12:51 PM]

## 2022-11-17 NOTE — Progress Notes (Signed)
[facility] Progress Note  Patient: Darryl Haley    TDD:[id number]  DOA: 11/14/2022     Date of Service: the patient was seen and examined on 11/17/2022  Chief Complaint  Patient presents with   Fall   Brief hospital course: Darryl Haley is a 89 y.o. male with medical history significant for Mild cognitive deficit, hospitalized in 2021 under IVC for confusion and paranoid ideation as well as new onset rapid A-fib in the setting of acute COVID-pneumonia with acute PE, who still lives independently but whose daughter checks in on him frequently who presents to the ED following a fall.  ED course and data review: BP 185/78 with otherwise normal vitals.  Labs including CBC, BMP and INR for the most part unremarkable. EKG reviewed and interpreted appears to be A-fib with controlled rate in the 80s Chest x-ray nonacute Left knee x-ray nonacute Left hip showing comminuted displaced intertrochanteric fracture of the left femur The ED provider spoke with Dr. Joice Lofts who will take patient to the OR later today. Hospitalist consulted for admission.  Assessment and Plan:  # Closed Left hip intertrochanteric fracture due to mechanical Fall S/p Reduction and internal fixation of displaced intertrochanteric left hip fracture with Bi[employer] Affixis TFN nail.  Continue pain meds F/u Ortho, PT/OT for placement  # History of atrial fibrillation Patient was prescribed metoprolol and Eliquis 3 years ago but ran out of refills after he refused to visit the doctors office to get his refills renewed Has followed with Dr. Welton Flakes of [facility] in the past Started Eliquis 5 mg p.o. twice daily  # Primary hypertension Hydralazine IV as needed while n.p.o. Patient is not currently on any medication    # Dementia without behavioral disturbance  # History of paranoid thought content According to daughter at bedside, patient has a lot of paranoia and gets up often at night to look out the window thinking that the  street lamps from, is trying to look in Delirium precautions Started Seroquel 50 mg p.o. twice daily, use Haldol as needed  # Vitamin D deficiency: started vitamin D 50,000 units p.o. weekly, follow with PCP to repeat vitamin D level after 3 to 6 months.  # Vitamin B12 deficiency: Started vitamin B12 1000 mcg IM injection daily during hospital stay, followed by oral supplement.  Follow-up PCP to repeat vitamin B12 level after 3 to 6 months.  # Hypophosphatemia, Phos repleted. Monitor electrolytes.    Body mass index is 30.44 kg/m.  Nutrition Problem: Increased nutrient needs Etiology: hip fracture Interventions:  Diet: Heart healthy DVT Prophylaxis: Therapeutic Anticoagulation with Eliquis     Advance goals of care discussion: Full code  Family Communication: family was present at bedside, at the time of interview.  The pt provided permission to discuss medical plan with the family. Opportunity was given to ask question and all questions were answered satisfactorily.   Disposition:  Pt is from Home, admitted with fall and Left femur fracture s/p ORIF, need to be seen by PT/OT, most likely will need SNF placement, which precludes a safe discharge. Discharge to SNF TBD, stable to dc when bed will be available..  Subjective: No overnight significant events, patient did sleep well throughout the day yesterday and last night.  Patient was sitting comfortably on the recliner.  Still has pain in the left hip but well-controlled. As per family patient still gets confused and we are in the room and is making him confuse, so family, date with a towel.  Physical Exam: General: NAD, lying comfortably Appear in no distress, affect appropriate Eyes: PERRLA ENT: Oral Mucosa Clear, moist  Neck: no JVD,  Cardiovascular: S1 and S2 Present, no Murmur,  Respiratory: good respiratory effort, Bilateral Air entry equal and Decreased, no Crackles, no wheezes Abdomen: Bowel Sound present, Soft and  no tenderness,  Skin: no rashes Extremities: no Pedal edema, no calf tenderness, Left Hip dressing CDI Neurologic: without any new focal findings Gait not checked due to patient safety concerns  Vitals:   11/16/22 2312 11/17/22 0500 11/17/22 0725 11/17/22 1554  BP: 116/62  116/74 (!) 156/68  Pulse: 67  65 75  Resp: 16  16 16   Temp: 97.9 F (36.6 C)  (!) 97.4 F (36.3 C) 98.7 F (37.1 C)  TempSrc:      SpO2: 97%  94% 99%  Weight:  90.8 kg    Height:        Intake/Output Summary (Last 24 hours) at 11/17/2022 1604 Last data filed at 11/17/2022 0741 Gross per 24 hour  Intake --  Output 350 ml  Net -350 ml   Filed Weights   11/14/22 0013 11/16/22 0719 11/17/22 0500  Weight: 77.1 kg 90.9 kg 90.8 kg    Data Reviewed: I have personally reviewed and interpreted daily labs, tele strips, imagings as discussed above. I reviewed all nursing notes, pharmacy notes, vitals, pertinent old records I have discussed plan of care as described above with RN and patient/family.  CBC: Recent Labs  Lab 11/14/22 0017 11/15/22 0849 11/16/22 0449 11/17/22 0454  WBC 9.9 17.7* 17.1* 10.8*  NEUTROABS 6.9  --   --   --   HGB 15.4 12.7* 12.5* 11.1*  HCT 46.7 36.8* 36.5* 32.7*  MCV 90.2 88.2 88.8 88.4  PLT 226 181 235 212   Basic Metabolic Panel: Recent Labs  Lab 11/14/22 0017 11/15/22 0849 11/16/22 0449 11/17/22 0454  NA 135 135 135 134*  K 4.1 4.3 3.7 3.9  CL 103 103 102 101  CO2 23 22 25 27   GLUCOSE 118* 123* 129* 116*  BUN 19 17 21 21   CREATININE 1.24 0.99 0.98 1.14  CALCIUM 8.6* 8.2* 8.2* 7.7*  MG 2.2 2.1 2.3 2.2  PHOS 2.7 2.4* 1.8* 2.8    Studies: No results found.  Scheduled Meds:  apixaban  5 mg Oral BID   bisacodyl  10 mg Oral QHS   cyanocobalamin  1,000 mcg Intramuscular Daily   Followed by   Melene Muller ON 11/22/2022] vitamin B-12  1,000 mcg Oral Daily   docusate sodium  100 mg Oral BID   feeding supplement  237 mL Oral BID BM   multivitamin with minerals  1 tablet  Oral Daily   phosphorus  500 mg Oral TID   polyethylene glycol  17 g Oral BID   QUEtiapine  50 mg Oral BID   Vitamin D (Ergocalciferol)  50,000 Units Oral Q7 days   Continuous Infusions:  sodium chloride 75 mL/hr at 11/14/22 1843   methocarbamol (ROBAXIN) IV     PRN Meds: acetaminophen, bisacodyl, diphenhydrAMINE, haloperidol lactate, hydrALAZINE, HYDROcodone-acetaminophen, magnesium hydroxide, methocarbamol **OR** methocarbamol (ROBAXIN) IV, metoCLOPramide **OR** metoCLOPramide (REGLAN) injection, morphine injection, ondansetron **OR** ondansetron (ZOFRAN) IV, sodium phosphate, traZODone  Time spent: 40 minutes  Author: Gillis Santa. MD [facility] 11/17/2022 4:04 PM  To reach On-call, see care teams to locate the attending and reach out to them via [email or web address]. If 7PM-7AM, please contact night-coverage If you still have difficulty reaching the attending provider, please page  the [facility] (Director on Call) for [facility] on amion for assistance.

## 2022-11-17 NOTE — Plan of Care (Signed)
  Problem: Education: Goal: Knowledge of General Education information will improve Description: Including pain rating scale, medication(s)/side effects and non-pharmacologic comfort measures Outcome: Progressing   Problem: Clinical Measurements: Goal: Ability to maintain clinical measurements within normal limits will improve Outcome: Progressing   Problem: Nutrition: Goal: Adequate nutrition will be maintained Outcome: Progressing   Problem: Coping: Goal: Level of anxiety will decrease Outcome: Progressing   Problem: Pain Managment: Goal: General experience of comfort will improve Outcome: Progressing   

## 2022-11-17 NOTE — Plan of Care (Signed)

## 2022-11-17 NOTE — TOC Progression Note (Signed)
Transition of Care [facility]) - Progression Note    Patient Details  Name: Darryl Haley MRN: [id number] Date of Birth: [birth year]  [facility]) CM/SW Contact  Bing Quarry, RN Phone Number: 11/17/2022, 12:55 PM  Clinical Narrative: 7/21: [facility] Accepted bed referral, but preference of [facility] remains pending review by [facility] Tiffany contacted by RN CM on 11/15/22. 1255 pm today.  Gabriel Cirri MSN RN CM  Transitions of Care Department [facility] [phone] Weekends Only          Expected Discharge Plan and Services                                               Social Determinants of Health (SDOH) Interventions SDOH Screenings   Tobacco Use: Low Risk  (11/14/2022)    Readmission Risk Interventions     No data to display

## 2022-11-17 NOTE — Progress Notes (Signed)
Subjective: 3 Days Post-Op Procedure(s) (LRB): INTRAMEDULLARY (IM) NAIL INTERTROCHANTERIC (Left) Patient reports pain as mild, daughter and PT is present with patient in the room. Patient is more awake and lucid today and states he slept well throughout the night last night. Patient is well, and has had no acute complaints or problems PT and care management to assist with discharge planning. Looking to [facility] to [facility], waiting to hear back for bed placement.  PT present with provider in the room to work with patient. Currently lives at home with family. Negative for chest pain and shortness of breath Fever: no fever or chills Gastrointestinal:Negative for nausea and vomiting Reports he is passing gas this morning.  Objective: Vital signs in last 24 hours: Temp:  [97.4 F (36.3 C)-97.9 F (36.6 C)] 97.4 F (36.3 C) (07/21 0725) Pulse Rate:  [65-83] 65 (07/21 0725) Resp:  [16] 16 (07/21 0725) BP: (116-123)/(44-74) 116/74 (07/21 0725) SpO2:  [94 %-98 %] 94 % (07/21 0725) Weight:  [90.8 kg] 90.8 kg (07/21 0500)  Intake/Output from previous day:  Intake/Output Summary (Last 24 hours) at 11/17/2022 1116 Last data filed at 11/17/2022 0741 Gross per 24 hour  Intake --  Output 350 ml  Net -350 ml    Intake/Output this shift: Total I/O In: -  Out: 350 [Urine:350]  Labs: Recent Labs    11/15/22 0849 11/16/22 0449 11/17/22 0454  HGB 12.7* 12.5* 11.1*   Recent Labs    11/16/22 0449 11/17/22 0454  WBC 17.1* 10.8*  RBC 4.11* 3.70*  HCT 36.5* 32.7*  PLT 235 212   Recent Labs    11/16/22 0449 11/17/22 0454  NA 135 134*  K 3.7 3.9  CL 102 101  CO2 25 27  BUN 21 21  CREATININE 0.98 1.14  GLUCOSE 129* 116*  CALCIUM 8.2* 7.7*   No results for input(s): "LABPT", "INR" in the last 72 hours.  EXAM General - Patient is Alert and Appropriate.  He remembers that he had surgery and that he is in the hospital. Extremity - ABD soft Neurovascular  intact Dorsiflexion/Plantar flexion intact Incision: dressing C/D/I No cellulitis present Compartment soft Dressing/Incision - dressings intact without apparent drainage or pus appreciated on honeycomb bandages. No surrounding swelling or erythema surrounding the incisions. Motor Function - intact, moving foot and toes well on exam.  Patient able to plantar and dorsiflex with good range of motion and strength.  Patient is neurovascularly intact down his left leg.  Posterior tibial pulses appreciated 2+ Abdomen soft with intact bowel sounds.  History reviewed. No pertinent past medical history.  Assessment/Plan: 3 Days Post-Op Procedure(s) (LRB): INTRAMEDULLARY (IM) NAIL INTERTROCHANTERIC (Left) Principal Problem:   Closed left hip fracture, initial encounter (HCC) Active Problems:   Accidental fall   History of atrial fibrillation   Primary hypertension   Dementia without behavioral disturbance (HCC)  Estimated body mass index is 30.44 kg/m as calculated from the following:   Height as of this encounter: 5\' 8"  (1.727 m).   Weight as of this encounter: 90.8 kg. Advance diet Up with therapy D/C IV fluids when tolerating po intake.  Vitals and labs reviewed this AM. Hemoglobin noted to drop 1.4 points from 12.5-11.1 on 7/20 to 7/21.  Continue to monitor [place] blood cell count improving Patient is passing gas without pain this morning. Up with therapy today.  Planning to transition to a SNF following discharge.  Planning to find bed placement at [facility].  Continue to follow along with TOC for  placement WBAT to left side  Following discharge, follow-up with [facility] orthopaedics in 10-14 days for x-rays and staple removal. Continue home dose of Eliquis at this time.  DVT Prophylaxis - TED hose and Eliquis Weight-Bearing as tolerated to left leg  Danise Edge, PA-C [facility] 11/17/2022, 11:16 AM

## 2022-11-18 DIAGNOSIS — S72002A Fracture of unspecified part of neck of left femur, initial encounter for closed fracture: Secondary | ICD-10-CM | POA: Diagnosis not present

## 2022-11-18 LAB — CBC
HCT: 32.7 % — ABNORMAL LOW (ref 39.0–52.0)
Hemoglobin: 11.2 g/dL — ABNORMAL LOW (ref 13.0–17.0)
MCH: 30 pg (ref 26.0–34.0)
MCHC: 34.3 g/dL (ref 30.0–36.0)
MCV: 87.7 fL (ref 80.0–100.0)
Platelets: 142 10*3/uL — ABNORMAL LOW (ref 150–400)
RBC: 3.73 MIL/uL — ABNORMAL LOW (ref 4.22–5.81)
RDW: 13.5 % (ref 11.5–15.5)
WBC: 10.7 10*3/uL — ABNORMAL HIGH (ref 4.0–10.5)
nRBC: 0 % (ref 0.0–0.2)

## 2022-11-18 LAB — BASIC METABOLIC PANEL
Anion gap: 9 (ref 5–15)
BUN: 24 mg/dL — ABNORMAL HIGH (ref 8–23)
CO2: 26 mmol/L (ref 22–32)
Calcium: 7.5 mg/dL — ABNORMAL LOW (ref 8.9–10.3)
Chloride: 100 mmol/L (ref 98–111)
Creatinine, Ser: 1.04 mg/dL (ref 0.61–1.24)
GFR, Estimated: >60 mL/min (ref >60)
Glucose, Bld: 117 mg/dL — ABNORMAL HIGH (ref 70–99)
Potassium: 3.9 mmol/L (ref 3.5–5.1)
Sodium: 135 mmol/L (ref 135–145)

## 2022-11-18 MED ORDER — HYDROCODONE-ACETAMINOPHEN 5-325 MG PO TABS: 0.5000 | ORAL_TABLET | Freq: Four times a day (QID) | ORAL | 0 refills | Status: AC | PRN

## 2022-11-18 MED ORDER — ACETAMINOPHEN 500 MG PO TABS: 500.0000 mg | ORAL_TABLET | Freq: Three times a day (TID) | ORAL | 0 refills | Status: AC

## 2022-11-18 NOTE — Progress Notes (Signed)
Physical Therapy Treatment Patient Details Name: Darryl Haley MRN: [id number] DOB: [birth year] Today's Date: 11/18/2022   History of Present Illness Pt is a 89 y/o M admitted on 11/14/22 after presenting with c/o a fall. Pt found to have closed L hip fx & is s/p IM nail on 11/14/22. PMH: mild cognitive deficit, a-fib    PT Comments  Co-tx with OT this am for safe transfers due limited wt bearing tolerance through L LE. Pt with baseline dementia, pleasant and cooperative throughout session with supportive family at bedside. Pt required ModAx2 for bed mobility and sit to stand transfers to reduce increased pain. Once standing with RW, pt required MinAx2 to side step to bedside chair. Assist needed for balance/weight shifting and management of RW. Improved tolerance for mobility this am. Will see pt again for PT after lunch. Continue per POC.    Assistance Recommended at Discharge Frequent or constant Supervision/Assistance  If plan is discharge home, recommend the following:  Can travel by private vehicle    A lot of help with bathing/dressing/bathroom;Two people to help with walking and/or transfers;Direct supervision/assist for medications management;Help with stairs or ramp for entrance;Assist for transportation;Assistance with cooking/housework;Direct supervision/assist for financial management   No  Equipment Recommendations  Other (comment) (TBD at next level of care)    Recommendations for Other Services       Precautions / Restrictions Precautions Precautions: Fall Restrictions Weight Bearing Restrictions: Yes LLE Weight Bearing: Weight bearing as tolerated     Mobility  Bed Mobility Overal bed mobility: Needs Assistance Bed Mobility: Supine to Sit     Supine to sit: Mod assist, +2 for physical assistance, HOB elevated     General bed mobility comments: requires cues    Transfers Overall transfer level: Needs assistance Equipment used: Rolling walker (2  wheels) Transfers: Sit to/from Stand Sit to Stand: Mod assist, +2 physical assistance, From elevated surface           General transfer comment: MIN A x2 for sidesteps with RW to the chair; pt using heel-toe motion on R foot due to difficulty weightbearing on L foot; pt holding RW too far out in front and requiring cues.    Ambulation/Gait                   Stairs             Wheelchair Mobility     Tilt Bed    Modified Rankin (Stroke Patients Only)       Balance Overall balance assessment: Needs assistance Sitting-balance support: Feet supported Sitting balance-[name] Scale: Fair     Standing balance support: Bilateral upper extremity supported, During functional activity, Reliant on assistive device for balance Standing balance-[name] Scale: Poor Standing balance comment: +2 assist                            Cognition Arousal/Alertness: Awake/alert Behavior During Therapy: WFL for tasks assessed/performed Overall Cognitive Status: History of cognitive impairments - at baseline                                 General Comments: Daughter in room and explaining that pt having difficulty with cognition prior to surgery as well. Mirror covered with towel to prevent further pt confusion. Pt knows that his L hip doesn't move well, but is unable to state why.  Exercises General Exercises - Lower Extremity Ankle Circles/Pumps: AROM, Both, 10 reps, Seated Long Arc Quad: AROM, Both, 10 reps, Seated Hip Flexion/Marching: AROM, Right, AAROM, Left, 10 reps, Seated    General Comments General comments (skin integrity, edema, etc.): Pt and daughter educated on role of PT and current functional goals, discussed transition to STR once medically cleared      Pertinent Vitals/Pain Pain Assessment Faces Pain Scale: Hurts a little bit Pain Location: L hip Pain Descriptors / Indicators: Grimacing, Discomfort, Guarding Pain  Intervention(s): Limited activity within patient's tolerance, Premedicated before session    Home Living                          Prior Function            PT Goals (current goals can now be found in the care plan section) Acute Rehab PT Goals Patient Stated Goal: get better Progress towards PT goals: Progressing toward goals    Frequency    BID      PT Plan Current plan remains appropriate    Co-evaluation PT/OT/SLP Co-Evaluation/Treatment: Yes Reason for Co-Treatment: Complexity of the patient's impairments (multi-system involvement);To address functional/ADL transfers;For patient/therapist safety PT goals addressed during session: Mobility/safety with mobility;Proper use of DME OT goals addressed during session: ADL's and self-care;Proper use of Adaptive equipment and DME      AM-PAC PT "6 Clicks" Mobility   Outcome Measure  Help needed turning from your back to your side while in a flat bed without using bedrails?: A Little Help needed moving from lying on your back to sitting on the side of a flat bed without using bedrails?: A Lot Help needed moving to and from a bed to a chair (including a wheelchair)?: A Lot Help needed standing up from a chair using your arms (e.g., wheelchair or bedside chair)?: A Lot Help needed to walk in hospital room?: A Lot Help needed climbing 3-5 steps with a railing? : Total 6 Click Score: 12    End of Session Equipment Utilized During Treatment: Gait belt Activity Tolerance: Patient tolerated treatment well Patient left: in chair;with call bell/phone within reach;with chair alarm set;with family/visitor present Nurse Communication: Mobility status PT Visit Diagnosis: Muscle weakness (generalized) (M62.81);Pain;Unsteadiness on feet (R26.81);Difficulty in walking, not elsewhere classified (R26.2) Pain - Right/Left: Left Pain - part of body: Leg     Time: 1020-1048 PT Time Calculation (min) (ACUTE ONLY): 28  min  Charges:    $Therapeutic Activity: 8-22 mins PT General Charges $$ ACUTE PT VISIT: 1 Visit                    Zadie Cleverly, PTA  Jannet Askew 11/18/2022, 1:23 PM

## 2022-11-18 NOTE — Discharge Instructions (Signed)

## 2022-11-18 NOTE — Progress Notes (Signed)
[facility] Progress Note  Patient: Darryl Haley    WGN:[id number]  DOA: 11/14/2022     Date of Service: the patient was seen and examined on 11/18/2022  Chief Complaint  Patient presents with   Fall   Brief hospital course: Horton Ellithorpe is a 89 y.o. male with medical history significant for Mild cognitive deficit, hospitalized in 2021 under IVC for confusion and paranoid ideation as well as new onset rapid A-fib in the setting of acute COVID-pneumonia with acute PE, who still lives independently but whose daughter checks in on him frequently who presents to the ED following a fall.  ED course and data review: BP 185/78 with otherwise normal vitals.  Labs including CBC, BMP and INR for the most part unremarkable. EKG reviewed and interpreted appears to be A-fib with controlled rate in the 80s Chest x-ray nonacute Left knee x-ray nonacute Left hip showing comminuted displaced intertrochanteric fracture of the left femur The ED provider spoke with Dr. Joice Lofts who will take patient to the OR later today. Hospitalist consulted for admission.  Assessment and Plan:  # Closed Left hip intertrochanteric fracture due to mechanical Fall S/p Reduction and internal fixation of displaced intertrochanteric left hip fracture with [employer] TFN nail. Continue pain meds F/u Ortho, PT/OT for placement  # History of atrial fibrillation Patient was prescribed metoprolol and Eliquis 3 years ago but ran out of refills after he refused to visit the doctors office to get his refills renewed Has followed with Dr. Welton Flakes of [facility] in the past Started Eliquis 5 mg p.o. twice daily  # Primary hypertension Hydralazine IV as needed while n.p.o. Patient is not currently on any medication    # Dementia without behavioral disturbance  # History of paranoid thought content According to daughter at bedside, patient has a lot of paranoia and gets up often at night to look out the window thinking that the  street lamps from, is trying to look in Delirium precautions S/p Seroquel 50 mg p.o. twice daily, use Haldol as needed 7/22 decrease Seroquel 25 mg p.o. nightly due to excessive sleepiness  # Vitamin D deficiency: started vitamin D 50,000 units p.o. weekly, follow with PCP to repeat vitamin D level after 3 to 6 months.  # Vitamin B12 deficiency: Started vitamin B12 1000 mcg IM injection daily during hospital stay, followed by oral supplement.  Follow-up PCP to repeat vitamin B12 level after 3 to 6 months.  # Hypophosphatemia, Phos repleted. Monitor electrolytes.    Body mass index is 30.54 kg/m.  Nutrition Problem: Increased nutrient needs Etiology: hip fracture Interventions:  Diet: Heart healthy DVT Prophylaxis: Therapeutic Anticoagulation with Eliquis     Advance goals of care discussion: Full code  Family Communication: family was present at bedside, at the time of interview.  The pt provided permission to discuss medical plan with the family. Opportunity was given to ask question and all questions were answered satisfactorily.   Disposition:  Pt is from Home, admitted with fall and Left femur fracture s/p ORIF, need to be seen by PT/OT, most likely will need SNF placement, which precludes a safe discharge. Discharge to SNF, stable to dc when bed will be available..  Subjective: No overnight significant events, patient was sitting comfortably in the recliner, stated that no pain at rest but it hurts when he walks around.  As per patient's family patient slept well last night, no delirium or sundowning.  Patient did not offer any other complaints.   Physical Exam:  General: NAD, lying comfortably Appear in no distress, affect appropriate Eyes: PERRLA ENT: Oral Mucosa Clear, moist  Neck: no JVD,  Cardiovascular: S1 and S2 Present, no Murmur,  Respiratory: good respiratory effort, Bilateral Air entry equal and Decreased, no Crackles, no wheezes Abdomen: Bowel Sound present,  Soft and no tenderness,  Skin: no rashes Extremities: no Pedal edema, no calf tenderness, Left Hip dressing CDI Neurologic: without any new focal findings Gait not checked due to patient safety concerns  Vitals:   11/17/22 1554 11/18/22 0040 11/18/22 0500 11/18/22 0739  BP: (!) 156/68 (!) 128/59  124/65  Pulse: 75 66  71  Resp: 16 20  17   Temp: 98.7 F (37.1 C) 98.2 F (36.8 C)  97.6 F (36.4 C)  TempSrc:      SpO2: 99% 94%  95%  Weight:   91.1 kg   Height:        Intake/Output Summary (Last 24 hours) at 11/18/2022 1530 Last data filed at 11/18/2022 0900 Gross per 24 hour  Intake 240 ml  Output 525 ml  Net -285 ml   Filed Weights   11/16/22 0719 11/17/22 0500 11/18/22 0500  Weight: 90.9 kg 90.8 kg 91.1 kg    Data Reviewed: I have personally reviewed and interpreted daily labs, tele strips, imagings as discussed above. I reviewed all nursing notes, pharmacy notes, vitals, pertinent old records I have discussed plan of care as described above with RN and patient/family.  CBC: Recent Labs  Lab 11/14/22 0017 11/15/22 0849 11/16/22 0449 11/17/22 0454 11/18/22 0552  WBC 9.9 17.7* 17.1* 10.8* 10.7*  NEUTROABS 6.9  --   --   --   --   HGB 15.4 12.7* 12.5* 11.1* 11.2*  HCT 46.7 36.8* 36.5* 32.7* 32.7*  MCV 90.2 88.2 88.8 88.4 87.7  PLT 226 181 235 212 142*   Basic Metabolic Panel: Recent Labs  Lab 11/14/22 0017 11/15/22 0849 11/16/22 0449 11/17/22 0454 11/18/22 0552  NA 135 135 135 134* 135  K 4.1 4.3 3.7 3.9 3.9  CL 103 103 102 101 100  CO2 23 22 25 27 26   GLUCOSE 118* 123* 129* 116* 117*  BUN 19 17 21 21  24*  CREATININE 1.24 0.99 0.98 1.14 1.04  CALCIUM 8.6* 8.2* 8.2* 7.7* 7.5*  MG 2.2 2.1 2.3 2.2  --   PHOS 2.7 2.4* 1.8* 2.8  --     Studies: No results found.  Scheduled Meds:  apixaban  5 mg Oral BID   bisacodyl  10 mg Oral QHS   cyanocobalamin  1,000 mcg Intramuscular Daily   Followed by   Melene Muller ON 11/22/2022] vitamin B-12  1,000 mcg Oral Daily    docusate sodium  100 mg Oral BID   feeding supplement  237 mL Oral BID BM   multivitamin with minerals  1 tablet Oral Daily   polyethylene glycol  17 g Oral BID   QUEtiapine  25 mg Oral QPM   Vitamin D (Ergocalciferol)  50,000 Units Oral Q7 days   Continuous Infusions:  sodium chloride 75 mL/hr at 11/14/22 1843   methocarbamol (ROBAXIN) IV     PRN Meds: acetaminophen, bisacodyl, diphenhydrAMINE, haloperidol lactate, hydrALAZINE, HYDROcodone-acetaminophen, magnesium hydroxide, methocarbamol **OR** methocarbamol (ROBAXIN) IV, metoCLOPramide **OR** metoCLOPramide (REGLAN) injection, morphine injection, ondansetron **OR** ondansetron (ZOFRAN) IV, sodium phosphate, traZODone  Time spent: 40 minutes  Author: Gillis Santa. MD [facility] 11/18/2022 3:30 PM  To reach On-call, see care teams to locate the attending and reach out to them via  [email or web address] If 7PM-7AM, please contact night-coverage If you still have difficulty reaching the attending provider, please page the [facility] (Director on Call) for [facility] on amion for assistance.

## 2022-11-18 NOTE — Progress Notes (Signed)
  Subjective: 4 Days Post-Op Procedure(s) (LRB): INTRAMEDULLARY (IM) NAIL INTERTROCHANTERIC (Left) Patient reports pain as mild in the left hip this morning.  Daughter at bedside. Patient is well, and has had no acute complaints or problems Plan is for discharge to SNF, preference for [facility]. Currently lives at home with family. Negative for chest pain and shortness of breath Fever: no Gastrointestinal:Negative for nausea and vomiting Reports he is passing gas this morning.  He has had a BM since surgery.  Objective: Vital signs in last 24 hours: Temp:  [97.6 F (36.4 C)-98.7 F (37.1 C)] 97.6 F (36.4 C) (07/22 0739) Pulse Rate:  [66-75] 71 (07/22 0739) Resp:  [16-20] 17 (07/22 0739) BP: (124-156)/(59-68) 124/65 (07/22 0739) SpO2:  [94 %-99 %] 95 % (07/22 0739) Weight:  [91.1 kg] 91.1 kg (07/22 0500)  Intake/Output from previous day:  Intake/Output Summary (Last 24 hours) at 11/18/2022 0749 Last data filed at 11/18/2022 0618 Gross per 24 hour  Intake --  Output 975 ml  Net -975 ml    Intake/Output this shift: No intake/output data recorded.  Labs: Recent Labs    11/15/22 0849 11/16/22 0449 11/17/22 0454 11/18/22 0552  HGB 12.7* 12.5* 11.1* 11.2*   Recent Labs    11/17/22 0454 11/18/22 0552  WBC 10.8* 10.7*  RBC 3.70* 3.73*  HCT 32.7* 32.7*  PLT 212 142*   Recent Labs    11/17/22 0454 11/18/22 0552  NA 134* 135  K 3.9 3.9  CL 101 100  CO2 27 26  BUN 21 24*  CREATININE 1.14 1.04  GLUCOSE 116* 117*  CALCIUM 7.7* 7.5*   No results for input(s): "LABPT", "INR" in the last 72 hours.  EXAM General - Patient is Alert and Appropriate.  He remembers that he had surgery and that he is in the hospital. Extremity - ABD soft Neurovascular intact Dorsiflexion/Plantar flexion intact Incision: dressing C/D/I No cellulitis present Compartment soft Dressing/Incision - clean, dry, no drainage to the left hip honeycomb dressings. Motor Function -  intact, moving foot and toes well on exam.  Abdomen soft with intact bowel sounds.  History reviewed. No pertinent past medical history.  Assessment/Plan: 4 Days Post-Op Procedure(s) (LRB): INTRAMEDULLARY (IM) NAIL INTERTROCHANTERIC (Left) Principal Problem:   Closed left hip fracture, initial encounter (HCC) Active Problems:   Accidental fall   History of atrial fibrillation   Primary hypertension   Dementia without behavioral disturbance (HCC)  Estimated body mass index is 30.54 kg/m as calculated from the following:   Height as of this encounter: 5\' 8"  (1.727 m).   Weight as of this encounter: 91.1 kg. Advance diet Up with therapy D/C IV fluids when tolerating po intake.  Vitals reviewed this AM.  Hg 11.2 this morning.  WBC 10.7 Current plan is for d/c to SNF.  [facility] is the preference. Patient is passing gas without pain this morning.  He has had a BM. Up with therapy today.   Following discharge, follow-up with [facility] orthopaedics in 10-14 days for x-rays and staple removal. Resume home dose of Eliquis at this time.  DVT Prophylaxis - TED hose and Eliquis Weight-Bearing as tolerated to left leg  J. Horris Latino, PA-C [facility] Orthopaedic Surgery 11/18/2022, 7:49 AM

## 2022-11-18 NOTE — Care Management Important Message (Signed)
Important Message  Patient Details  Name: Darryl Haley MRN: [id number] Date of Birth: June 05, 1929   Medicare Important Message Given:  Yes     [name] 11/18/2022, 11:26 AM

## 2022-11-18 NOTE — TOC Progression Note (Signed)
Transition of Care [facility]) - Progression Note    Patient Details  Name: Darryl Haley MRN: [id number] Date of Birth: February 27, 1930  [facility]) CM/SW Contact  Marlowe Sax, RN Phone Number: 11/18/2022, 11:09 AM  Clinical Narrative:     Reached out to Tiffany at [facility] asking if they will review for a bed, awaiting a response        Expected Discharge Plan and Services                                               Social Determinants of Health (SDOH) Interventions SDOH Screenings   Tobacco Use: Low Risk  (11/14/2022)    Readmission Risk Interventions     No data to display

## 2022-11-18 NOTE — Plan of Care (Signed)

## 2022-11-18 NOTE — Progress Notes (Signed)
Physical Therapy Treatment Patient Details Name: Darryl Haley MRN: [id number] DOB: October 04, 1929 Today's Date: 11/18/2022   History of Present Illness Pt is a 89 y/o M admitted on 11/14/22 after presenting with c/o a fall. Pt found to have closed L hip fx & is s/p IM nail on 11/14/22. PMH: mild cognitive deficit, a-fib    PT Comments  Pt confused but able to follow most 1-step commands with extra time and cuing and put forth good effort during the session.  Pt was able to clear the surface of the recliner chair with +1 max A but unable to come to full upright standing.  With +2 assist pt was able to come to standing but upon initial stand required assist to prevent posterior LOB, stability improved after standing several seconds. Multiple attempts made to ambulate but pt unable to advance either LE.  Pt will benefit from continued PT services upon discharge to safely address deficits listed in patient problem list for decreased caregiver assistance and eventual return to PLOF.        Assistance Recommended at Discharge Frequent or constant Supervision/Assistance  If plan is discharge home, recommend the following:  Can travel by private vehicle    A lot of help with bathing/dressing/bathroom;Two people to help with walking and/or transfers;Direct supervision/assist for medications management;Help with stairs or ramp for entrance;Assist for transportation;Assistance with cooking/housework;Direct supervision/assist for financial management   No  Equipment Recommendations  Other (comment) (TBD)    Recommendations for Other Services       Precautions / Restrictions Precautions Precautions: Fall Restrictions Weight Bearing Restrictions: Yes LLE Weight Bearing: Weight bearing as tolerated     Mobility  Bed Mobility               General bed mobility comments: NT, pt in recliner    Transfers Overall transfer level: Needs assistance Equipment used: Rolling walker (2  wheels) Transfers: Sit to/from Stand Sit to Stand: Mod assist, +2 physical assistance, From elevated surface           General transfer comment: Max multi-modal cues for sequencing    Ambulation/Gait               General Gait Details: Pt unable to advance either LE this session   Stairs             Wheelchair Mobility     Tilt Bed    Modified Rankin (Stroke Patients Only)       Balance Overall balance assessment: Needs assistance Sitting-balance support: Feet supported Sitting balance-[name] Scale: Fair                                      Cognition Arousal/Alertness: Awake/alert Behavior During Therapy: WFL for tasks assessed/performed Overall Cognitive Status: History of cognitive impairments - at baseline                                          Exercises Total Joint Exercises Ankle Circles/Pumps: AROM, Strengthening, Both, 10 reps, 5 reps Hip ABduction/ADduction: AAROM, Strengthening, Both, 5 reps Straight Leg Raises: AAROM, Strengthening, Both, 5 reps Long Arc Quad: Strengthening, Both, 10 reps, 15 reps Knee Flexion: Strengthening, Both, 10 reps, 15 reps    General Comments        Pertinent Vitals/Pain Pain Assessment Pain Assessment:  PAINAD Breathing: normal Negative Vocalization: none Facial Expression: smiling or inexpressive Body Language: relaxed Consolability: no need to console PAINAD Score: 0 Pain Location: L hip Pain Descriptors / Indicators: Guarding, Sore Pain Intervention(s): Repositioned, Premedicated before session, Monitored during session    Home Living                          Prior Function            PT Goals (current goals can now be found in the care plan section) Progress towards PT goals: PT to reassess next treatment    Frequency    BID      PT Plan Current plan remains appropriate    Co-evaluation              AM-PAC PT "6 Clicks" Mobility    Outcome Measure  Help needed turning from your back to your side while in a flat bed without using bedrails?: A Little Help needed moving from lying on your back to sitting on the side of a flat bed without using bedrails?: A Lot Help needed moving to and from a bed to a chair (including a wheelchair)?: Total Help needed standing up from a chair using your arms (e.g., wheelchair or bedside chair)?: Total Help needed to walk in hospital room?: Total Help needed climbing 3-5 steps with a railing? : Total 6 Click Score: 9    End of Session Equipment Utilized During Treatment: Gait belt Activity Tolerance: Patient tolerated treatment well Patient left: in chair;with call bell/phone within reach;with chair alarm set;with family/visitor present Nurse Communication: Mobility status;Other (comment) (Pt unable to advance LEs during amb attempts, will need lift back to bed) PT Visit Diagnosis: Muscle weakness (generalized) (M62.81);Pain;Unsteadiness on feet (R26.81);Difficulty in walking, not elsewhere classified (R26.2) Pain - Right/Left: Left Pain - part of body: Leg     Time: 4403-4742 PT Time Calculation (min) (ACUTE ONLY): 22 min  Charges:    $Therapeutic Activity: 8-22 mins PT General Charges $$ ACUTE PT VISIT: 1 Visit                    D. Scott Taylinn Brabant PT, DPT 11/18/22, 5:20 PM

## 2022-11-18 NOTE — Progress Notes (Signed)
Occupational Therapy Treatment Patient Details Name: Judd Mccubbin MRN: [id number] DOB: Jun 21, 1929 Today's Date: 11/18/2022   History of present illness Pt is a 89 y/o M admitted on 11/14/22 after presenting with c/o a fall. Pt found to have closed L hip fx & is s/p IM nail on 11/14/22. PMH: mild cognitive deficit, a-fib   OT comments  Pt received semi-reclined in bed. Appearing alert; daughter in room; willing to work with OT on transfer to chair and grooming. T/f MOD A x2 to stand, MIN A x2 sidestep with RW to chair. See flowsheet below for further details of session. Left seated in recliner with all needs in reach.  Patient will benefit from continued OT while in acute care.    Recommendations for follow up therapy are one component of a multi-disciplinary discharge planning process, led by the attending physician.  Recommendations may be updated based on patient status, additional functional criteria and insurance authorization.    Assistance Recommended at Discharge Frequent or constant Supervision/Assistance  Patient can return home with the following  A lot of help with walking and/or transfers;A lot of help with bathing/dressing/bathroom;Assistance with cooking/housework;Direct supervision/assist for medications management;Assist for transportation;Help with stairs or ramp for entrance   Equipment Recommendations  None recommended by OT    Recommendations for Other Services      Precautions / Restrictions Precautions Precautions: Fall Restrictions Weight Bearing Restrictions: No LLE Weight Bearing: Weight bearing as tolerated       Mobility Bed Mobility Overal bed mobility: Needs Assistance Bed Mobility: Supine to Sit     Supine to sit: Mod assist, +2 for physical assistance, HOB elevated     General bed mobility comments: requires cues    Transfers Overall transfer level: Needs assistance Equipment used: Rolling walker (2 wheels) Transfers: Sit to/from Stand Sit  to Stand: Mod assist, +2 physical assistance           General transfer comment: MIN A x2 for sidesteps with RW to the chair; pt using heel-toe motion on R foot due to difficulty weightbearing on L foot; pt holding RW too far out in front and requiring cues.     Balance Overall balance assessment: Needs assistance Sitting-balance support: Feet supported Sitting balance-[name] Scale: Fair     Standing balance support: During functional activity, Bilateral upper extremity supported, Reliant on assistive device for balance Standing balance-[name] Scale: Poor Standing balance comment: +2 assist                           ADL either performed or assessed with clinical judgement   ADL Overall ADL's : Needs assistance/impaired     Grooming: Wash/dry face;Oral care;Brushing hair;Supervision/safety;Set up;Cueing for sequencing;Sitting Grooming Details (indicate cue type and reason): seated in recliner chair. Pt brushed teeth for at least 5 minutes; eventually needed cues from OT to move on to rinsing; however, pt not noticing that he had gray basin available for spitting; about to spit into cup with water until cued by OT to spit into gray basin, then to rinse with water.                             Functional mobility during ADLs: Minimal assistance;+2 for physical assistance;Rolling walker (2 wheels)      Extremity/Trunk Assessment Upper Extremity Assessment Upper Extremity Assessment: Overall WFL for tasks assessed   Lower Extremity Assessment Lower Extremity Assessment: LLE deficits/detail LLE Deficits /  Details: s/p fx surgery        Vision       Perception     Praxis      Cognition Arousal/Alertness: Awake/alert Behavior During Therapy: WFL for tasks assessed/performed Overall Cognitive Status: History of cognitive impairments - at baseline                                 General Comments: Daughter in room and explaining that pt  having difficulty with cognition prior to surgery as well. Mirror covered with towel to prevent further pt confusion. Pt knows that his L hip doesn't move well, but is unable to state why.        Exercises      Shoulder Instructions       General Comments pt on room air. Daughter in room throughout session. Pt pleasant and cooperative; demonstrating some confusion, but good sense of humor.    Pertinent Vitals/ Pain       Pain Assessment Pain Assessment: PAINAD Breathing: normal Negative Vocalization: none Facial Expression: smiling or inexpressive Body Language: relaxed Consolability: no need to console PAINAD Score: 0 Pain Location: likely in L femur with activity; difficulty WB on LLE, but no overt complaints Pain Descriptors / Indicators: Guarding Pain Intervention(s): Limited activity within patient's tolerance, Monitored during session  Home Living                                          Prior Functioning/Environment              Frequency  Min 1X/week        Progress Toward Goals  OT Goals(current goals can now be found in the care plan section)  Progress towards OT goals: Progressing toward goals  Acute Rehab OT Goals Patient Stated Goal: Get better OT Goal Formulation: With family Time For Goal Achievement: 11/29/22 Potential to Achieve Goals: Good ADL Goals Pt Will Perform Lower Body Dressing: with supervision;sitting/lateral leans Pt Will Transfer to Toilet: with supervision;ambulating;regular height toilet;stand pivot transfer Pt Will Perform Tub/Shower Transfer: with supervision;ambulating  Plan Discharge plan remains appropriate    Co-evaluation    PT/OT/SLP Co-Evaluation/Treatment: Yes Reason for Co-Treatment: Complexity of the patient's impairments (multi-system involvement);To address functional/ADL transfers;For patient/therapist safety   OT goals addressed during session: ADL's and self-care;Proper use of Adaptive  equipment and DME      AM-PAC OT "6 Clicks" Daily Activity     Outcome Measure   Help from another person eating meals?: A Little Help from another person taking care of personal grooming?: A Little Help from another person toileting, which includes using toliet, bedpan, or urinal?: A Lot Help from another person bathing (including washing, rinsing, drying)?: A Lot Help from another person to put on and taking off regular upper body clothing?: A Little Help from another person to put on and taking off regular lower body clothing?: A Lot 6 Click Score: 15    End of Session Equipment Utilized During Treatment: Rolling walker (2 wheels)  OT Visit Diagnosis: Unsteadiness on feet (R26.81);Other abnormalities of gait and mobility (R26.89);Pain Pain - Right/Left: Left Pain - part of body: Hip   Activity Tolerance Patient tolerated treatment well;Patient limited by pain   Patient Left with call bell/phone within reach;with chair alarm set;with family/visitor present;in chair   Nurse Communication  Mobility status        Time: 1020-1048 OT Time Calculation (min): 28 min  Charges: OT General Charges $OT Visit: 1 Visit OT Treatments $Self Care/Home Management : 8-22 mins  Linward Foster, MS, OTR/L  Alvester Morin 11/18/2022, 11:47 AM

## 2022-11-19 DIAGNOSIS — S72002A Fracture of unspecified part of neck of left femur, initial encounter for closed fracture: Secondary | ICD-10-CM | POA: Diagnosis not present

## 2022-11-19 LAB — URINALYSIS, W/ REFLEX TO CULTURE (INFECTION SUSPECTED)
Bacteria, UA: NONE SEEN
Bilirubin Urine: NEGATIVE
Glucose, UA: NEGATIVE mg/dL
Hgb urine dipstick: NEGATIVE
Ketones, ur: NEGATIVE mg/dL
Leukocytes,Ua: NEGATIVE
Nitrite: NEGATIVE
Protein, ur: 30 mg/dL — AB
Specific Gravity, Urine: 1.024 (ref 1.005–1.030)
pH: 5 (ref 5.0–8.0)

## 2022-11-19 LAB — CBC
HCT: 33.4 % — ABNORMAL LOW (ref 39.0–52.0)
Hemoglobin: 11.5 g/dL — ABNORMAL LOW (ref 13.0–17.0)
MCH: 29.9 pg (ref 26.0–34.0)
MCHC: 34.4 g/dL (ref 30.0–36.0)
MCV: 87 fL (ref 80.0–100.0)
Platelets: 309 10*3/uL (ref 150–400)
RBC: 3.84 MIL/uL — ABNORMAL LOW (ref 4.22–5.81)
RDW: 13.3 % (ref 11.5–15.5)
WBC: 11.1 10*3/uL — ABNORMAL HIGH (ref 4.0–10.5)
nRBC: 0 % (ref 0.0–0.2)

## 2022-11-19 LAB — BASIC METABOLIC PANEL
Anion gap: 8 (ref 5–15)
BUN: 22 mg/dL (ref 8–23)
CO2: 26 mmol/L (ref 22–32)
Calcium: 7.6 mg/dL — ABNORMAL LOW (ref 8.9–10.3)
Chloride: 97 mmol/L — ABNORMAL LOW (ref 98–111)
Creatinine, Ser: 0.98 mg/dL (ref 0.61–1.24)
GFR, Estimated: >60 mL/min (ref >60)
Glucose, Bld: 112 mg/dL — ABNORMAL HIGH (ref 70–99)
Potassium: 3.6 mmol/L (ref 3.5–5.1)
Sodium: 131 mmol/L — ABNORMAL LOW (ref 135–145)

## 2022-11-19 NOTE — TOC Progression Note (Signed)
Transition of Care [facility]) - Progression Note    Patient Details  Name: Darryl Haley MRN: [id number] Date of Birth: 11/13/1929  Transition of Care [facility]) CM/SW Contact  Marlowe Sax, RN Phone Number: 11/19/2022, 12:17 PM  Clinical Narrative:    Sherron Monday with Darryl Haley the patient's daughter and explained that we only have a bed offer from [facility], she stated that she will do some research and see about [facility] and let me know,    Expected Discharge Plan: Skilled Nursing Facility Barriers to Discharge: SNF Pending bed offer, Insurance Authorization  Expected Discharge Plan and Services                                               Social Determinants of Health (SDOH) Interventions SDOH Screenings   Tobacco Use: Low Risk  (11/14/2022)    Readmission Risk Interventions     No data to display

## 2022-11-19 NOTE — Progress Notes (Addendum)
Physical Therapy Treatment Patient Details Name: Darryl Haley MRN: [id number] DOB: 02/27/1930 Today's Date: 11/19/2022   History of Present Illness Pt is a 89 y/o M admitted on 11/14/22 after presenting with c/o a fall. Pt found to have closed L hip fx & is s/p IM nail on 11/14/22. PMH: mild cognitive deficit, a-fib    PT Comments  Participated in exercises as described below.  To EOB with mod/max a x 2 with HOB elevated.  Supervision in sitting once upright. Stands with heavy +2 assist from elevated bed to RW.  Slow cautious steps to recliner at bedside with some B knee buckling and cues to stand fully upright.  Unable to safely progress gait at this time despite +2 assist.  Guided safely to chair.  Remained up after session.  Pt awake and engaged this session.        Assistance Recommended at Discharge Frequent or constant Supervision/Assistance  If plan is discharge home, recommend the following:  Can travel by private vehicle    A lot of help with bathing/dressing/bathroom;Two people to help with walking and/or transfers;Direct supervision/assist for medications management;Help with stairs or ramp for entrance;Assist for transportation;Assistance with cooking/housework;Direct supervision/assist for financial management   No  Equipment Recommendations  Other (comment) (TBD)    Recommendations for Other Services       Precautions / Restrictions Precautions Precautions: Fall Restrictions Weight Bearing Restrictions: Yes LLE Weight Bearing: Weight bearing as tolerated     Mobility  Bed Mobility Overal bed mobility: Needs Assistance Bed Mobility: Supine to Sit     Supine to sit: Mod assist, +2 for physical assistance, HOB elevated, Max assist          Transfers Overall transfer level: Needs assistance Equipment used: Rolling walker (2 wheels) Transfers: Sit to/from Stand Sit to Stand: Mod assist, +2 physical assistance, From elevated surface, Max assist                 Ambulation/Gait Ambulation/Gait assistance: Mod assist, +2 physical assistance Gait Distance (Feet): 3 Feet Assistive device: Rolling walker (2 wheels) Gait Pattern/deviations: Step-to pattern Gait velocity: decreased     General Gait Details: Pt unable to advance either LE this session   Stairs             Wheelchair Mobility     Tilt Bed    Modified Rankin (Stroke Patients Only)       Balance Overall balance assessment: Needs assistance Sitting-balance support: Feet supported Sitting balance-[name] Scale: Fair     Standing balance support: Bilateral upper extremity supported, During functional activity, Reliant on assistive device for balance Standing balance-[name] Scale: Poor Standing balance comment: +2 assist                            Cognition Arousal/Alertness: Awake/alert Behavior During Therapy: WFL for tasks assessed/performed Overall Cognitive Status: History of cognitive impairments - at baseline                                          Exercises Total Joint Exercises Ankle Circles/Pumps: AROM, Strengthening, Both, 10 reps, 5 reps Hip ABduction/ADduction: AAROM, Strengthening, Both, 5 reps Straight Leg Raises: AAROM, Strengthening, Both, 5 reps Long Arc Quad: Strengthening, Both, 10 reps, 15 reps Knee Flexion: Strengthening, Both, 10 reps, 15 reps Other Exercises Other Exercises: seated A/ AAROM x 10  General Comments        Pertinent Vitals/Pain Pain Assessment Pain Assessment: Faces Faces Pain Scale: Hurts a little bit Pain Location: L hip Pain Descriptors / Indicators: Guarding, Sore Pain Intervention(s): Limited activity within patient's tolerance, Monitored during session, RN gave pain meds during session, Repositioned    Home Living                          Prior Function            PT Goals (current goals can now be found in the care plan section) Progress towards PT  goals: Progressing toward goals    Frequency    7X/week      PT Plan Current plan remains appropriate    Co-evaluation              AM-PAC PT "6 Clicks" Mobility   Outcome Measure  Help needed turning from your back to your side while in a flat bed without using bedrails?: A Little Help needed moving from lying on your back to sitting on the side of a flat bed without using bedrails?: A Lot Help needed moving to and from a bed to a chair (including a wheelchair)?: A Lot Help needed standing up from a chair using your arms (e.g., wheelchair or bedside chair)?: A Lot Help needed to walk in hospital room?: Total Help needed climbing 3-5 steps with a railing? : Total 6 Click Score: 11    End of Session Equipment Utilized During Treatment: Gait belt Activity Tolerance: Patient tolerated treatment well Patient left: in chair;with call bell/phone within reach;with chair alarm set;with family/visitor present Nurse Communication: Mobility status;Other (comment) (Pt unable to advance LEs during amb attempts, will need lift back to bed) PT Visit Diagnosis: Muscle weakness (generalized) (M62.81);Pain;Unsteadiness on feet (R26.81);Difficulty in walking, not elsewhere classified (R26.2) Pain - Right/Left: Left Pain - part of body: Leg     Time: 1130-1144 PT Time Calculation (min) (ACUTE ONLY): 14 min  Charges:    $Therapeutic Exercise: 8-22 mins PT General Charges $$ ACUTE PT VISIT: 1 Visit                   Danielle Dess, PTA 11/19/22, 11:53 AM

## 2022-11-19 NOTE — Progress Notes (Signed)
[facility] Progress Note  Patient: Darryl Haley    WGN:[id number]  DOA: 11/14/2022     Date of Service: the patient was seen and examined on 11/19/2022  Chief Complaint  Patient presents with   Fall   Brief hospital course: Darryl Haley is a 89 y.o. male with medical history significant for Mild cognitive deficit, hospitalized in 2021 under IVC for confusion and paranoid ideation as well as new onset rapid A-fib in the setting of acute COVID-pneumonia with acute PE, who still lives independently but whose daughter checks in on him frequently who presents to the ED following a fall.  ED course and data review: BP 185/78 with otherwise normal vitals.  Labs including CBC, BMP and INR for the most part unremarkable. EKG reviewed and interpreted appears to be A-fib with controlled rate in the 80s Chest x-ray nonacute Left knee x-ray nonacute Left hip showing comminuted displaced intertrochanteric fracture of the left femur The ED provider spoke with Darryl Haley who will take patient to the OR later today. Hospitalist consulted for admission.  Assessment and Plan:  # Closed Left hip intertrochanteric fracture due to mechanical Fall S/p Reduction and internal fixation of displaced intertrochanteric left hip fracture with Biomet Affixis TFN nail. Continue pain meds F/u Ortho, PT/OT for placement  # History of atrial fibrillation Patient was prescribed metoprolol and Eliquis 3 years ago but ran out of refills after he refused to visit the doctors office to get his refills renewed Has followed with Darryl Haley of [facility] in the past Started Eliquis 5 mg p.o. twice daily  # Primary hypertension Hydralazine IV as needed while n.p.o. Patient is not currently on any medication    # Dementia without behavioral disturbance  # History of paranoid thought content According to daughter at bedside, patient has a lot of paranoia and gets up often at night to look out the window thinking that the  street lamps from, is trying to look in Delirium precautions S/p Seroquel 50 mg p.o. twice daily, use Haldol as needed 7/22 decrease Seroquel 25 mg p.o. nightly due to excessive sleepiness  # Vitamin D deficiency: started vitamin D 50,000 units p.o. weekly, follow with PCP to repeat vitamin D level after 3 to 6 months.  # Vitamin B12 deficiency: Started vitamin B12 1000 mcg IM injection daily during hospital stay, followed by oral supplement.  Follow-up PCP to repeat vitamin B12 level after 3 to 6 months.  # Hypophosphatemia, Phos repleted. Monitor electrolytes.    Body mass index is 30.54 kg/m.  Nutrition Problem: Increased nutrient needs Etiology: hip fracture Interventions:  Diet: Heart healthy DVT Prophylaxis: Therapeutic Anticoagulation with Eliquis     Advance goals of care discussion: Full code  Family Communication: family was present at bedside, at the time of interview.  The pt provided permission to discuss medical plan with the family. Opportunity was given to ask question and all questions were answered satisfactorily.   Disposition:  Pt is from Home, admitted with fall and Left femur fracture s/p ORIF, need to be seen by PT/OT, most likely will need SNF placement, which precludes a safe discharge. Discharge to SNF, stable to dc when bed will be available..  Subjective: No overnight significant events, patient was sitting comfortably in the recliner.  Pain is well-controlled at rest, but it hurts when he starts walking. As per patient's daughter no confusion or agitation overnight.   Physical Exam: General: NAD, lying comfortably Appear in no distress, affect appropriate Eyes: PERRLA ENT:  Oral Mucosa Clear, moist  Neck: no JVD,  Cardiovascular: S1 and S2 Present, no Murmur,  Respiratory: good respiratory effort, Bilateral Air entry equal and Decreased, no Crackles, no wheezes Abdomen: Bowel Sound present, Soft and no tenderness,  Skin: no rashes Extremities:  no Pedal edema, no calf tenderness, Left Hip dressing CDI Neurologic: without any new focal findings Gait not checked due to patient safety concerns  Vitals:   11/18/22 1548 11/18/22 2318 11/19/22 0757 11/19/22 1454  BP: (!) 143/61 (!) 153/82 (!) 157/70 (!) 163/61  Pulse: 90 71 82 61  Resp: 16 16 16 18   Temp:  (!) 97.4 F (36.3 C) 97.8 F (36.6 C) 97.8 F (36.6 C)  TempSrc:      SpO2: 99% 97% 97% 95%  Weight:      Height:        Intake/Output Summary (Last 24 hours) at 11/19/2022 1718 Last data filed at 11/19/2022 0750 Gross per 24 hour  Intake --  Output 325 ml  Net -325 ml   Filed Weights   11/16/22 0719 11/17/22 0500 11/18/22 0500  Weight: 90.9 kg 90.8 kg 91.1 kg    Data Reviewed: I have personally reviewed and interpreted daily labs, tele strips, imagings as discussed above. I reviewed all nursing notes, pharmacy notes, vitals, pertinent old records I have discussed plan of care as described above with RN and patient/family.  CBC: Recent Labs  Lab 11/14/22 0017 11/15/22 0849 11/16/22 0449 11/17/22 0454 11/18/22 0552 11/19/22 0435  WBC 9.9 17.7* 17.1* 10.8* 10.7* 11.1*  NEUTROABS 6.9  --   --   --   --   --   HGB 15.4 12.7* 12.5* 11.1* 11.2* 11.5*  HCT 46.7 36.8* 36.5* 32.7* 32.7* 33.4*  MCV 90.2 88.2 88.8 88.4 87.7 87.0  PLT 226 181 235 212 142* 309   Basic Metabolic Panel: Recent Labs  Lab 11/14/22 0017 11/15/22 0849 11/16/22 0449 11/17/22 0454 11/18/22 0552 11/19/22 0435  NA 135 135 135 134* 135 131*  K 4.1 4.3 3.7 3.9 3.9 3.6  CL 103 103 102 101 100 97*  CO2 23 22 25 27 26 26   GLUCOSE 118* 123* 129* 116* 117* 112*  BUN 19 17 21 21  24* 22  CREATININE 1.24 0.99 0.98 1.14 1.04 0.98  CALCIUM 8.6* 8.2* 8.2* 7.7* 7.5* 7.6*  MG 2.2 2.1 2.3 2.2  --   --   PHOS 2.7 2.4* 1.8* 2.8  --   --     Studies: No results found.  Scheduled Meds:  apixaban  5 mg Oral BID   bisacodyl  10 mg Oral QHS   cyanocobalamin  1,000 mcg Intramuscular Daily    Followed by   Melene Muller ON 11/22/2022] vitamin B-12  1,000 mcg Oral Daily   docusate sodium  100 mg Oral BID   feeding supplement  237 mL Oral BID BM   multivitamin with minerals  1 tablet Oral Daily   polyethylene glycol  17 g Oral BID   QUEtiapine  25 mg Oral QPM   Vitamin D (Ergocalciferol)  50,000 Units Oral Q7 days   Continuous Infusions:  sodium chloride 75 mL/hr at 11/14/22 1843   methocarbamol (ROBAXIN) IV     PRN Meds: acetaminophen, bisacodyl, diphenhydrAMINE, haloperidol lactate, hydrALAZINE, HYDROcodone-acetaminophen, magnesium hydroxide, methocarbamol **OR** methocarbamol (ROBAXIN) IV, metoCLOPramide **OR** metoCLOPramide (REGLAN) injection, morphine injection, ondansetron **OR** ondansetron (ZOFRAN) IV, sodium phosphate, traZODone  Time spent: 40 minutes  Author: Gillis Santa. MD [facility] 11/19/2022 5:18 PM  To reach  On-call, see care teams to locate the attending and reach out to them via [email or web address]. If 7PM-7AM, please contact night-coverage If you still have difficulty reaching the attending provider, please page the [facility] (Director on Call) for [facility] on amion for assistance.

## 2022-11-19 NOTE — Progress Notes (Signed)
Subjective: 5 Days Post-Op Procedure(s) (LRB): INTRAMEDULLARY (IM) NAIL INTERTROCHANTERIC (Left) Patient reports pain as mild in the left hip this morning.  Daughter at bedside. Patient is well, and has had no acute complaints or problems Plan is for discharge to SNF, preference for [facility]. Currently lives at home with family. Negative for chest pain and shortness of breath Fever: no Gastrointestinal:Negative for nausea and vomiting Reports he is passing gas this morning.  He has had a BM since surgery.  Objective: Vital signs in last 24 hours: Temp:  [97.4 F (36.3 C)-97.8 F (36.6 C)] 97.8 F (36.6 C) (07/23 0757) Pulse Rate:  [71-90] 82 (07/23 0757) Resp:  [16] 16 (07/23 0757) BP: (143-157)/(61-82) 157/70 (07/23 0757) SpO2:  [97 %-99 %] 97 % (07/23 0757)  Intake/Output from previous day:  Intake/Output Summary (Last 24 hours) at 11/19/2022 1022 Last data filed at 11/19/2022 0750 Gross per 24 hour  Intake --  Output 325 ml  Net -325 ml    Intake/Output this shift: Total I/O In: -  Out: 225 [Urine:225]  Labs: Recent Labs    11/17/22 0454 11/18/22 0552 11/19/22 0435  HGB 11.1* 11.2* 11.5*   Recent Labs    11/18/22 0552 11/19/22 0435  WBC 10.7* 11.1*  RBC 3.73* 3.84*  HCT 32.7* 33.4*  PLT 142* 309   Recent Labs    11/18/22 0552 11/19/22 0435  NA 135 131*  K 3.9 3.6  CL 100 97*  CO2 26 26  BUN 24* 22  CREATININE 1.04 0.98  GLUCOSE 117* 112*  CALCIUM 7.5* 7.6*   No results for input(s): "LABPT", "INR" in the last 72 hours.  EXAM General - Patient is Alert and Appropriate. Extremity - ABD soft Neurovascular intact Dorsiflexion/Plantar flexion intact Incision: dressing C/D/I No cellulitis present Compartment soft Dressing/Incision - clean, dry, no drainage to the left hip honeycomb dressings. Motor Function - intact, moving foot and toes well on exam.  Abdomen soft with intact bowel sounds.  History reviewed. No pertinent past medical  history.  Assessment/Plan: 5 Days Post-Op Procedure(s) (LRB): INTRAMEDULLARY (IM) NAIL INTERTROCHANTERIC (Left) Principal Problem:   Closed left hip fracture, initial encounter (HCC) Active Problems:   Accidental fall   History of atrial fibrillation   Primary hypertension   Dementia without behavioral disturbance (HCC)  Estimated body mass index is 30.54 kg/m as calculated from the following:   Height as of this encounter: 5\' 8"  (1.727 m).   Weight as of this encounter: 91.1 kg. Advance diet Up with therapy D/C IV fluids when tolerating po intake.  Vitals reviewed this AM.  Hg 11.5 this morning.  WBC 11.1 Current plan is for d/c to SNF.  [facility] is the preference. Patient is passing gas without pain this morning.  He has had a BM. Up with therapy today.   Following discharge, follow-up with [facility] orthopaedics in 10-14 days for x-rays and staple removal. Resume home dose of Eliquis at this time.  DVT Prophylaxis - TED hose and Eliquis Weight-Bearing as tolerated to left leg  J. Horris Latino, PA-C [facility] Orthopaedic Surgery 11/19/2022, 10:22 AM

## 2022-11-19 NOTE — TOC Progression Note (Signed)
Transition of Care [facility]) - Progression Note    Patient Details  Name: Darryl Haley MRN: [id number] Date of Birth: November 15, 1929  Transition of [facility]) CM/SW Contact  Marlowe Sax, RN Phone Number: 11/19/2022, 9:07 AM  Clinical Narrative:    Reached out to [facility] and requested a review for a bed offer, they will call back after their morning meeting   Expected Discharge Plan: Skilled Nursing Facility Barriers to Discharge: SNF Pending bed offer, Insurance Authorization  Expected Discharge Plan and Services                                               Social Determinants of Health (SDOH) Interventions SDOH Screenings   Tobacco Use: Low Risk  (11/14/2022)    Readmission Risk Interventions     No data to display

## 2022-11-19 NOTE — Plan of Care (Signed)
  Problem: Clinical Measurements: Goal: Ability to maintain clinical measurements within normal limits will improve Outcome: Progressing Goal: Will remain free from infection Outcome: Progressing   

## 2022-11-20 ENCOUNTER — Other Ambulatory Visit (HOSPITAL_COMMUNITY): Payer: Self-pay

## 2022-11-20 DIAGNOSIS — F0390 Unspecified dementia without behavioral disturbance: Secondary | ICD-10-CM

## 2022-11-20 DIAGNOSIS — E669 Obesity, unspecified: Secondary | ICD-10-CM | POA: Insufficient documentation

## 2022-11-20 DIAGNOSIS — I480 Paroxysmal atrial fibrillation: Secondary | ICD-10-CM | POA: Diagnosis not present

## 2022-11-20 DIAGNOSIS — S72002A Fracture of unspecified part of neck of left femur, initial encounter for closed fracture: Secondary | ICD-10-CM | POA: Diagnosis not present

## 2022-11-20 LAB — CBC
HCT: 32.8 % — ABNORMAL LOW (ref 39.0–52.0)
Hemoglobin: 11.2 g/dL — ABNORMAL LOW (ref 13.0–17.0)
MCH: 29.9 pg (ref 26.0–34.0)
MCHC: 34.1 g/dL (ref 30.0–36.0)
MCV: 87.7 fL (ref 80.0–100.0)
Platelets: 254 10*3/uL (ref 150–400)
RBC: 3.74 MIL/uL — ABNORMAL LOW (ref 4.22–5.81)
RDW: 13.4 % (ref 11.5–15.5)
WBC: 9.9 10*3/uL (ref 4.0–10.5)
nRBC: 0 % (ref 0.0–0.2)

## 2022-11-20 LAB — BASIC METABOLIC PANEL
Anion gap: 9 (ref 5–15)
BUN: 25 mg/dL — ABNORMAL HIGH (ref 8–23)
CO2: 24 mmol/L (ref 22–32)
Calcium: 7.8 mg/dL — ABNORMAL LOW (ref 8.9–10.3)
Chloride: 100 mmol/L (ref 98–111)
Creatinine, Ser: 0.98 mg/dL (ref 0.61–1.24)
GFR, Estimated: >60 mL/min (ref >60)
Glucose, Bld: 112 mg/dL — ABNORMAL HIGH (ref 70–99)
Potassium: 4 mmol/L (ref 3.5–5.1)
Sodium: 133 mmol/L — ABNORMAL LOW (ref 135–145)

## 2022-11-20 LAB — MAGNESIUM: Magnesium: 2.6 mg/dL — ABNORMAL HIGH (ref 1.7–2.4)

## 2022-11-20 LAB — PHOSPHORUS: Phosphorus: 2.9 mg/dL (ref 2.5–4.6)

## 2022-11-20 MED ORDER — TAMSULOSIN HCL 0.4 MG PO CAPS
0.4000 mg | ORAL_CAPSULE | Freq: Every day | ORAL | Status: DC
  Administered 2022-11-20 – 2022-11-23 (×4): 0.4 mg via ORAL
  Filled 2022-11-20 (×4): qty 1

## 2022-11-20 MED ORDER — MELATONIN 5 MG PO TABS
5.0000 mg | ORAL_TABLET | Freq: Every day | ORAL | Status: DC
  Administered 2022-11-20 – 2022-11-22 (×3): 5 mg via ORAL
  Filled 2022-11-20 (×3): qty 1

## 2022-11-20 NOTE — Hospital Course (Signed)
Darryl Haley is a 89 y.o. male with medical history significant for Mild cognitive deficit, hospitalized in 2021 under IVC for confusion and paranoid ideation as well as new onset rapid A-fib in the setting of acute COVID-pneumonia with acute PE, who still lives independently but whose daughter checks in on him frequently who presents to the ED following a fall.  Left hip showing comminuted displaced intertrochanteric fracture of the left femur S/p Reduction and internal fixation of displaced intertrochanteric left hip fracture with Biomet Affixis TFN nail 7/19.

## 2022-11-20 NOTE — Progress Notes (Signed)
Occupational Therapy Treatment Patient Details Name: Darryl Haley MRN: [id number] DOB: 26-Jul-1929 Today's Date: 11/20/2022   History of present illness Pt is a 89 y/o M admitted on 11/14/22 after presenting with c/o a fall. Pt found to have closed L hip fx & is s/p IM nail on 11/14/22. PMH: mild cognitive deficit, a-fib   OT comments  Pt participated in ADLs at EOB; MAX A x2 to return to supine. See flowsheet for details. Patient will benefit from continued OT while in acute care.    Recommendations for follow up therapy are one component of a multi-disciplinary discharge planning process, led by the attending physician.  Recommendations may be updated based on patient status, additional functional criteria and insurance authorization.    Assistance Recommended at Discharge Frequent or constant Supervision/Assistance  Patient can return home with the following  A lot of help with bathing/dressing/bathroom;Assistance with cooking/housework;Direct supervision/assist for medications management;Assist for transportation;Help with stairs or ramp for entrance;Two people to help with walking and/or transfers   Equipment Recommendations  None recommended by OT    Recommendations for Other Services      Precautions / Restrictions Precautions Precautions: Fall Restrictions Weight Bearing Restrictions: No LLE Weight Bearing: Weight bearing as tolerated       Mobility Bed Mobility Overal bed mobility: Needs Assistance Bed Mobility: Sit to Supine       Sit to supine: Max assist, +2 for physical assistance   General bed mobility comments: Attempted to have pt scoot at EOB towards [facility], but he was unable.    Transfers                   General transfer comment: Did not transfer this session; was just transferred with PTA from chair to EOB with +2 assist.     Balance Overall balance assessment: Needs assistance Sitting-balance support: Feet supported Sitting balance-[name] Scale:  Good                                     ADL either performed or assessed with clinical judgement   ADL Overall ADL's : Needs assistance/impaired     Grooming: Oral care;Supervision/safety;Set up;Sitting Grooming Details (indicate cue type and reason): seated at EOB                             Functional mobility during ADLs:  (no mobility attempted this session)      Extremity/Trunk Assessment Upper Extremity Assessment Upper Extremity Assessment: Overall WFL for tasks assessed   Lower Extremity Assessment Lower Extremity Assessment: LLE deficits/detail LLE Deficits / Details: s/p fx surgery        Vision       Perception     Praxis      Cognition Arousal/Alertness: Awake/alert Behavior During Therapy: WFL for tasks assessed/performed Overall Cognitive Status: History of cognitive impairments - at baseline                                 General Comments: Pt is alert; notices things outside window; is distracted by SCD's and thinks they are snakes on his legs        Exercises      Shoulder Instructions       General Comments      Pertinent Vitals/ Pain  Pain Assessment Pain Assessment: PAINAD Breathing: normal Negative Vocalization: occasional moan/groan, low speech, negative/disapproving quality Facial Expression: smiling or inexpressive Body Language: relaxed Consolability: no need to console PAINAD Score: 1 Pain Location: L hip Pain Descriptors / Indicators: Guarding, Sore Pain Intervention(s): Limited activity within patient's tolerance, Monitored during session  Home Living                                          Prior Functioning/Environment              Frequency  Min 1X/week        Progress Toward Goals  OT Goals(current goals can now be found in the care plan section)  Progress towards OT goals: Progressing toward goals  Acute Rehab OT Goals Patient Stated  Goal: Get better OT Goal Formulation: With family Time For Goal Achievement: 11/29/22 Potential to Achieve Goals: Good ADL Goals Pt Will Perform Lower Body Dressing: with supervision;sitting/lateral leans Pt Will Transfer to Toilet: with supervision;ambulating;regular height toilet;stand pivot transfer Pt Will Perform Tub/Shower Transfer: with supervision;ambulating  Plan Discharge plan remains appropriate    Co-evaluation                 AM-PAC OT "6 Clicks" Daily Activity     Outcome Measure   Help from another person eating meals?: A Little Help from another person taking care of personal grooming?: A Little Help from another person toileting, which includes using toliet, bedpan, or urinal?: A Lot Help from another person bathing (including washing, rinsing, drying)?: A Lot Help from another person to put on and taking off regular upper body clothing?: A Little Help from another person to put on and taking off regular lower body clothing?: A Lot 6 Click Score: 15    End of Session    OT Visit Diagnosis: Unsteadiness on feet (R26.81);Other abnormalities of gait and mobility (R26.89);Pain   Activity Tolerance Patient tolerated treatment well;Patient limited by pain   Patient Left in bed;with call bell/phone within reach;with bed alarm set;with family/visitor present   Nurse Communication Mobility status        Time: 1478-2956 OT Time Calculation (min): 15 min  Charges: OT General Charges $OT Visit: 1 Visit OT Treatments $Self Care/Home Management : 8-22 mins  Linward Foster, MS, OTR/L  Alvester Morin 11/20/2022, 4:35 PM

## 2022-11-20 NOTE — TOC Benefit Eligibility Note (Signed)
Pharmacy Patient Advocate Encounter  Insurance verification completed.    The patient is insured through [facility] Medicare Part D  Ran test claim for Eliquis 5 mg and the current 30 day co-pay is $47.00.   This test claim was processed through [facility]- copay amounts may vary at other pharmacies due to pharmacy/plan contracts, or as the patient moves through the different stages of their insurance plan.    Roland Earl, CPHT Pharmacy Patient Advocate Specialist [facility] Patient Advocate Team Direct Number: [phone]  Fax: [phone]

## 2022-11-20 NOTE — Progress Notes (Signed)
  Progress Note   Patient: Darryl Haley ZOX:[id number] DOB: 16-Apr-1930 DOA: 11/14/2022     6 DOS: the patient was seen and examined on 11/20/2022   Brief hospital course: Darryl Haley is a 89 y.o. male with medical history significant for Mild cognitive deficit, hospitalized in 2021 under IVC for confusion and paranoid ideation as well as new onset rapid A-fib in the setting of acute COVID-pneumonia with acute PE, who still lives independently but whose daughter checks in on him frequently who presents to the ED following a fall.  Left hip showing comminuted displaced intertrochanteric fracture of the left femur S/p Reduction and internal fixation of displaced intertrochanteric left hip fracture with Bi[employer]is TFN nail 7/19.    Principal Problem:   Closed left hip fracture, initial encounter [facility]) Active Problems:   Accidental fall   History of atrial fibrillation   Primary hypertension   Dementia without behavioral disturbance (HCC)   Assessment and Plan: # Closed Left hip intertrochanteric fracture due to mechanical Fall S/p Reduction and internal fixation of displaced intertrochanteric left hip fracture with Biomet Affixis TFN nail. Continue pain meds Discussed with TOC, patient is pending nursing home placement.   Paroxysmal atrial fibrillation Patient was prescribed metoprolol and Eliquis 3 years ago but ran out of refills after he refused to visit the doctors office to get his refills renewed Started Eliquis 5 mg p.o. twice daily   # Primary hypertension Continue to monitor.   # Dementia without behavioral disturbance  # History of paranoid thought content Patient still could not sleep after taking Seroquel, added melatonin.   # Vitamin D deficiency: started vitamin D 50,000 units p.o. weekly, follow with PCP to repeat vitamin D level after 3 to 6 months.   # Vitamin B12 deficiency: Started vitamin B12 1000 mcg IM injection daily during hospital stay, followed by oral  supplement.  Follow-up PCP to repeat vitamin B12 level after 3 to 6 months.   # Hypophosphatemia,  Improved.     Subjective:  Patient has some baseline confusion, pain under control.  No shortness of breath.  Physical Exam: Vitals:   11/19/22 1454 11/19/22 2321 11/20/22 0500 11/20/22 0817  BP: (!) 163/61 137/70  125/78  Pulse: 61 71  75  Resp: 18 18  16   Temp: 97.8 F (36.6 C) 98.6 F (37 C)  97.7 F (36.5 C)  TempSrc:      SpO2: 95% 98%  94%  Weight:   92 kg   Height:       General exam: Appears calm and comfortable  Respiratory system: Clear to auscultation. Respiratory effort normal. Cardiovascular system: S1 & S2 heard, RRR. No JVD, murmurs, rubs, gallops or clicks. No pedal edema. Gastrointestinal system: Abdomen is nondistended, soft and nontender. No organomegaly or masses felt. Normal bowel sounds heard. Central nervous system: Alert and oriented x1 No focal neurological deficits. Extremities: Symmetric 5 x 5 power. Skin: No rashes, lesions or ulcers Psychiatry: Flat affect   Data Reviewed:  Lab results reviewed.  Family Communication: Daughter updated at bedside.  Disposition: Status is: Inpatient Remains inpatient appropriate because: Unsafe discharge, pending nursing placement.     Time spent: 35 minutes  Author: Marrion Coy, MD 11/20/2022 3:04 PM  For on call review [email or web address].

## 2022-11-20 NOTE — TOC Progression Note (Signed)
Transition of Care [facility]) - Progression Note    Patient Details  Name: Paxten Appelt MRN: [id number] Date of Birth: 1929-12-26  Transition of Care [facility]) CM/SW Contact  Marlowe Sax, RN Phone Number: 11/20/2022, 9:06 AM  Clinical Narrative:     Spoke with the patient's daughter Kathie Rhodes, explained that [facility] is the only option in the area that has offered a bed, she stated that they are not real please with the research about [facility], I explained that we would need to have a plan for DC due to him being medically ready,  she asked me to check with [facility] in [place] and other places in [place] area, I expanded the bed search and called French Ana at [facility] and left a secure VM asking her to review for a bed offer, will review bed offers once obtained, Ins approval will also need to be obtained  Expected Discharge Plan: Skilled Nursing Facility Barriers to Discharge: SNF Pending bed offer, Insurance Authorization  Expected Discharge Plan and Services                                               Social Determinants of Health (SDOH) Interventions SDOH Screenings   Tobacco Use: Low Risk  (11/14/2022)    Readmission Risk Interventions     No data to display

## 2022-11-20 NOTE — Progress Notes (Signed)
Subjective: 6 Days Post-Op Procedure(s) (LRB): INTRAMEDULLARY (IM) NAIL INTERTROCHANTERIC (Left) Patient reports pain as mild in the left hip this morning.  Daughter at bedside. Patient is well, and has had no acute complaints or problems Plan is for discharge to SNF, preference for [facility]. Only approval at this time is for [facility]. Currently lives at home with family. Negative for chest pain and shortness of breath Fever: no Gastrointestinal:Negative for nausea and vomiting Reports he is passing gas this morning.  He has had a BM since surgery.  Objective: Vital signs in last 24 hours: Temp:  [97.8 F (36.6 C)-98.6 F (37 C)] 98.6 F (37 C) (07/23 2321) Pulse Rate:  [61-82] 71 (07/23 2321) Resp:  [16-18] 18 (07/23 2321) BP: (137-163)/(61-70) 137/70 (07/23 2321) SpO2:  [95 %-98 %] 98 % (07/23 2321) Weight:  [92 kg] 92 kg (07/24 0500)  Intake/Output from previous day:  Intake/Output Summary (Last 24 hours) at 11/20/2022 0755 Last data filed at 11/20/2022 0440 Gross per 24 hour  Intake --  Output 1100 ml  Net -1100 ml    Intake/Output this shift: No intake/output data recorded.  Labs: Recent Labs    11/18/22 0552 11/19/22 0435 11/20/22 0504  HGB 11.2* 11.5* 11.2*   Recent Labs    11/19/22 0435 11/20/22 0504  WBC 11.1* 9.9  RBC 3.84* 3.74*  HCT 33.4* 32.8*  PLT 309 254   Recent Labs    11/19/22 0435 11/20/22 0504  NA 131* 133*  K 3.6 4.0  CL 97* 100  CO2 26 24  BUN 22 25*  CREATININE 0.98 0.98  GLUCOSE 112* 112*  CALCIUM 7.6* 7.8*   No results for input(s): "LABPT", "INR" in the last 72 hours.  EXAM General - Patient is Alert and Appropriate. Extremity - ABD soft Neurovascular intact Dorsiflexion/Plantar flexion intact Incision: dressing C/D/I No cellulitis present Compartment soft Dressing/Incision - Mild drainage noted to the middle incision.  Proximal incision starting to come off, fresh dressing applied this morning. Motor Function -  intact, moving foot and toes well on exam.  Abdomen soft with intact bowel sounds.  History reviewed. No pertinent past medical history.  Assessment/Plan: 6 Days Post-Op Procedure(s) (LRB): INTRAMEDULLARY (IM) NAIL INTERTROCHANTERIC (Left) Principal Problem:   Closed left hip fracture, initial encounter [facility]) Active Problems:   Accidental fall   History of atrial fibrillation   Primary hypertension   Dementia without behavioral disturbance (HCC)  Estimated body mass index is 30.84 kg/m as calculated from the following:   Height as of this encounter: 5\' 8"  (1.727 m).   Weight as of this encounter: 92 kg. Advance diet Up with therapy D/C IV fluids when tolerating po intake.  Vitals reviewed this AM.  Hg 11.2 this morning.  WBC 9.9 Current plan is for d/c to SNF.  [facility] is the preference. Only approval is for [facility].  Will check on status at [facility]. Patient is passing gas without pain this morning.  He has had a BM. Up with therapy today.   Following discharge, follow-up with [facility] orthopaedics in 10-14 days for x-rays and staple removal. Continue home dose of Eliquis at this time.  DVT Prophylaxis - TED hose and Eliquis Weight-Bearing as tolerated to left leg  J. Horris Latino, PA-C [facility] Orthopaedic Surgery 11/20/2022, 7:55 AM

## 2022-11-20 NOTE — Progress Notes (Signed)
Physical Therapy Treatment Patient Details Name: Darryl Haley MRN: [id number] DOB: [birth year] Today's Date: 11/20/2022   History of Present Illness Pt is a 89 y/o M admitted on 11/14/22 after presenting with c/o a fall. Pt found to have closed L hip fx & is s/p IM nail on 11/14/22. PMH: mild cognitive deficit, a-fib    PT Comments  Pt was long sitting in bed upon arrival. He is alert but disoriented. Pleasantly confused and cooperative. Was willing to perform all desired task requested of him but limited by pain. Increased time to exit R side of bed with extensive assistance. Stood from elevated bed height and tolerated standing long enough for dressing change and hygiene care after BM. Pt unable to take steps but was able pivot to recliner with increased time. Overall pt is progressing but far from baseline. Highly recommend continued skilled PT at DC to maximize independence and safety with all ADLs while decreasing caregiver burden.      Assistance Recommended at Discharge Frequent or constant Supervision/Assistance  If plan is discharge home, recommend the following:  Can travel by private vehicle    A lot of help with bathing/dressing/bathroom;Two people to help with walking and/or transfers;Direct supervision/assist for medications management;Help with stairs or ramp for entrance;Assist for transportation;Assistance with cooking/housework;Direct supervision/assist for financial management      Equipment Recommendations  Other (comment) (Defer to next level of care)       Precautions / Restrictions Precautions Precautions: Fall Restrictions Weight Bearing Restrictions: Yes LLE Weight Bearing: Weight bearing as tolerated     Mobility  Bed Mobility Overal bed mobility: Needs Assistance Bed Mobility: Supine to Sit  Supine to sit: Mod assist, Max assist, HOB elevated  General bed mobility comments: Pt was able to exit bed with mod-max of one. Increased time and vcs throughout.     Transfers Overall transfer level: Needs assistance Equipment used: Rolling walker (2 wheels) Transfers: Sit to/from Stand Sit to Stand: Mod assist, Max assist, From elevated surface Stand pivot transfers: Mod assist, Max assist, From elevated surface  General transfer comment: Pt stood 2 x EOB prior to stand pivot to recliner. Pt severely limited by pain on LLE. easily able to lift LLE but unable to tolerate wt acceptance on LLE to step with RLE.    Ambulation/Gait  General Gait Details: unable to take steps due to pain    Balance Overall balance assessment: Needs assistance Sitting-balance support: Feet supported Sitting balance-[name] Scale: Good Sitting balance - Comments: no LOB or unsteadiness with sitting EOB   Standing balance support: Bilateral upper extremity supported, During functional activity, Reliant on assistive device for balance Standing balance-[name] Scale: Fair Standing balance comment: pt able to static stand with BUE support with CGA only. stood x ~ 4 minutes while hygiene care was performed after BM      Cognition Arousal/Alertness: Awake/alert Behavior During Therapy: WFL for tasks assessed/performed Overall Cognitive Status: History of cognitive impairments - at baseline      General Comments: Pt A and O x 4           General Comments General comments (skin integrity, edema, etc.): discussed at length post acute PT. Family remains agreeable to SNF placement.      Pertinent Vitals/Pain Pain Assessment Pain Assessment: PAINAD Breathing: normal Negative Vocalization: none Facial Expression: smiling or inexpressive Body Language: relaxed Consolability: no need to console PAINAD Score: 0 Pain Location: L hip/ LBP Pain Descriptors / Indicators: Guarding, Sore Pain Intervention(s): Limited activity within  patient's tolerance, Monitored during session, Premedicated before session, Repositioned           PT Goals (current goals can now be found  in the care plan section) Acute Rehab PT Goals Patient Stated Goal: none stated Progress towards PT goals: Progressing toward goals    Frequency    7X/week      PT Plan Current plan remains appropriate    Co-evaluation     PT goals addressed during session: Mobility/safety with mobility;Balance;Proper use of DME;Strengthening/ROM        AM-PAC PT "6 Clicks" Mobility   Outcome Measure  Help needed turning from your back to your side while in a flat bed without using bedrails?: A Little Help needed moving from lying on your back to sitting on the side of a flat bed without using bedrails?: A Lot Help needed moving to and from a bed to a chair (including a wheelchair)?: A Lot Help needed standing up from a chair using your arms (e.g., wheelchair or bedside chair)?: A Lot Help needed to walk in hospital room?: Total Help needed climbing 3-5 steps with a railing? : Total 6 Click Score: 11    End of Session Equipment Utilized During Treatment: Gait belt Activity Tolerance: Patient tolerated treatment well;Patient limited by pain Patient left: in chair;with call bell/phone within reach;with chair alarm set;with family/visitor present Nurse Communication: Mobility status PT Visit Diagnosis: Muscle weakness (generalized) (M62.81);Pain;Unsteadiness on feet (R26.81);Difficulty in walking, not elsewhere classified (R26.2) Pain - Right/Left: Left Pain - part of body: Leg     Time: 0827-0910 PT Time Calculation (min) (ACUTE ONLY): 43 min  Charges:    $Therapeutic Activity: 38-52 mins PT General Charges $$ ACUTE PT VISIT: 1 Visit                     Jetta Lout PTA 11/20/22, 8:51 AM

## 2022-11-21 DIAGNOSIS — R338 Other retention of urine: Secondary | ICD-10-CM | POA: Diagnosis not present

## 2022-11-21 DIAGNOSIS — F0390 Unspecified dementia without behavioral disturbance: Secondary | ICD-10-CM | POA: Diagnosis not present

## 2022-11-21 DIAGNOSIS — S72002A Fracture of unspecified part of neck of left femur, initial encounter for closed fracture: Secondary | ICD-10-CM | POA: Diagnosis not present

## 2022-11-21 MED ORDER — HALOPERIDOL 2 MG PO TABS: 2.0000 mg | ORAL_TABLET | Freq: Four times a day (QID) | ORAL | Status: DC | PRN

## 2022-11-21 MED ORDER — HALOPERIDOL LACTATE 5 MG/ML IJ SOLN
2.0000 mg | Freq: Four times a day (QID) | INTRAMUSCULAR | Status: DC | PRN
  Administered 2022-11-21: 2 mg via INTRAMUSCULAR

## 2022-11-21 NOTE — Care Management Important Message (Signed)
Important Message  Patient Details  Name: Jaymin Waln MRN: [id number] Date of Birth: [birth year]   Medicare Important Message Given:  Other (see comment)  Left voice message for patient's POA, Jacqualin Combes, daughter [phone]) to review the form. Will await a return call.    Olegario Messier A [place] 11/21/2022, 2:42 PM

## 2022-11-21 NOTE — Progress Notes (Signed)
Physical Therapy Treatment Patient Details Name: Darryl Haley MRN: [id number] DOB: [birth year] Today's Date: 11/21/2022   History of Present Illness Pt is a 89 y/o M admitted on 11/14/22 after presenting with c/o a fall. Pt found to have closed L hip fx & is s/p IM nail on 11/14/22. PMH: mild cognitive deficit, a-fib    PT Comments  Pt was long sitting in bed upon arrival. He just had BM but endorses needing to have another. Was able to get to [facility] with +2 assistance. He remains disoriented but pleasantly confused. Follows commands with increased time for processing. Pt then stood and ambulated ~ 8 ft with RW with + 2 assistance. He is progressing but continues to be limited by pain. Acute PT will continue to follow and progress per current POC. DC recs remain appropriate.     Assistance Recommended at Discharge Frequent or constant Supervision/Assistance  If plan is discharge home, recommend the following:  Can travel by private vehicle    A lot of help with bathing/dressing/bathroom;Two people to help with walking and/or transfers;Direct supervision/assist for medications management;Help with stairs or ramp for entrance;Assist for transportation;Assistance with cooking/housework;Direct supervision/assist for financial management   No  Equipment Recommendations  Other (comment) (Defer to next level of care)       Precautions / Restrictions Precautions Precautions: Fall Restrictions Weight Bearing Restrictions: Yes LLE Weight Bearing: Weight bearing as tolerated     Mobility  Bed Mobility Overal bed mobility: Needs Assistance Bed Mobility: Supine to Sit  Supine to sit: Mod assist, Max assist, HOB elevated   Transfers Overall transfer level: Needs assistance Equipment used: Rolling walker (2 wheels) Transfers: Sit to/from Stand Sit to Stand: Mod assist, +2 physical assistance, +2 safety/equipment  General transfer comment: Pt continues to require extensive assistance to stand from  elevated  surface and requires +2 assistance for safety    Ambulation/Gait Ambulation/Gait assistance: Min assist, Mod assist, +2 physical assistance, +2 safety/equipment Gait Distance (Feet): 8 Feet Assistive device: Rolling walker (2 wheels) Gait Pattern/deviations: Step-to pattern Gait velocity: decreased  General Gait Details: Pt ambulated 8 ft with RW with +2 assistance.   Balance Overall balance assessment: Needs assistance Sitting-balance support: Feet supported Sitting balance-[name] Scale: Good     Standing balance support: Bilateral upper extremity supported, During functional activity, Reliant on assistive device for balance Standing balance-[name] Scale: Poor      Cognition Arousal/Alertness: Awake/alert Behavior During Therapy: WFL for tasks assessed/performed Overall Cognitive Status: History of cognitive impairments - at baseline      General Comments: Pt is A but disoriented. pleasntly confused but is able to follwo commands with increased processing time. Pt gets distracted easily.               Pertinent Vitals/Pain Pain Assessment Pain Assessment: PAINAD Breathing: occasional labored breathing, short period of hyperventilation Negative Vocalization: occasional moan/groan, low speech, negative/disapproving quality Facial Expression: smiling or inexpressive Body Language: relaxed Consolability: no need to console PAINAD Score: 2 Pain Location: L hip Pain Descriptors / Indicators: Guarding, Sore Pain Intervention(s): Limited activity within patient's tolerance, Monitored during session, Premedicated before session, Repositioned, Ice applied     PT Goals (current goals can now be found in the care plan section) Acute Rehab PT Goals Patient Stated Goal: none stated Progress towards PT goals: Progressing toward goals    Frequency    7X/week      PT Plan Current plan remains appropriate    Co-evaluation     PT goals  addressed during session:  Mobility/safety with mobility;Balance;Proper use of DME;Strengthening/ROM        AM-PAC PT "6 Clicks" Mobility   Outcome Measure  Help needed turning from your back to your side while in a flat bed without using bedrails?: A Little Help needed moving from lying on your back to sitting on the side of a flat bed without using bedrails?: A Lot Help needed moving to and from a bed to a chair (including a wheelchair)?: Total Help needed standing up from a chair using your arms (e.g., wheelchair or bedside chair)?: Total Help needed to walk in hospital room?: Total Help needed climbing 3-5 steps with a railing? : Total 6 Click Score: 9    End of Session Equipment Utilized During Treatment: Gait belt Activity Tolerance: Patient tolerated treatment well Patient left: in chair;with call bell/phone within reach;with chair alarm set;with family/visitor present Nurse Communication: Mobility status PT Visit Diagnosis: Muscle weakness (generalized) (M62.81);Pain;Unsteadiness on feet (R26.81);Difficulty in walking, not elsewhere classified (R26.2) Pain - Right/Left: Left Pain - part of body: Leg     Time: 1020-1044 PT Time Calculation (min) (ACUTE ONLY): 24 min  Charges:    $Therapeutic Activity: 23-37 mins PT General Charges $$ ACUTE PT VISIT: 1 Visit                    Jetta Lout PTA 11/21/22, 2:01 PM

## 2022-11-21 NOTE — TOC Progression Note (Signed)
Transition of Care [facility]) - Progression Note    Patient Details  Name: Darryl Haley MRN: [id number] Date of Birth: 1929/09/07  Transition of Care [facility]) CM/SW Contact  Marlowe Sax, RN Phone Number: 11/21/2022, 11:10 AM  Clinical Narrative:    Spoke with the patient's daughter Kathie Rhodes, I explained that we do have several bed offers, not [facility] She stated that her daughter is going to go and tour [facility] and see that they think, they may go there, her daughter is going to go to [facility] this morning, I explained that I need to know a choice today and that we still have to get Ins approval, I explained he is medically ready to DC, she stated that they will have an answer today   Expected Discharge Plan: Skilled Nursing Facility Barriers to Discharge: SNF Pending bed offer, Insurance Authorization  Expected Discharge Plan and Services                                               Social Determinants of Health (SDOH) Interventions SDOH Screenings   Tobacco Use: Low Risk  (11/14/2022)    Readmission Risk Interventions     No data to display

## 2022-11-21 NOTE — Plan of Care (Signed)
  Problem: Education: Goal: Knowledge of General Education information will improve Description: Including pain rating scale, medication(s)/side effects and non-pharmacologic comfort measures Outcome: Progressing   Problem: Clinical Measurements: Goal: Will remain free from infection Outcome: Progressing   Problem: Activity: Goal: Risk for activity intolerance will decrease Outcome: Progressing   

## 2022-11-21 NOTE — Progress Notes (Signed)
Nutrition Follow-up  DOCUMENTATION CODES:   Not applicable  INTERVENTION:  - Continue Ensure Enlive po BID, each supplement provides 350 kcal and 20 grams of protein.  NUTRITION DIAGNOSIS:   Increased nutrient needs related to hip fracture as evidenced by estimated needs.  GOAL:   Patient will meet greater than or equal to 90% of their needs - Met, continue as goal.   MONITOR:   PO intake, Supplement acceptance, Labs, Weight trends, I & O's, Skin  REASON FOR ASSESSMENT:   Consult Hip fracture protocol  ASSESSMENT:   89 y.o. male with medical history significant for dementia, A-fib, COVID-pneumonia with acute PE, HTN and PVD who is admitted with hip fracture after fall.  Meds reviewed: dulcolax, Vit B12, colace, MVI, miralax, Vit D. Labs reviewed: Na low, BUN elevated, Magnesium high.   The pt reports that he has been eating very well since diet advancement  and was eating well PTA. Pt is meeting his needs at this time. RD will continue to monitor PO intakes.   NUTRITION - FOCUSED PHYSICAL EXAM:  WDL - no wasting noted.   Diet Order:   Diet Order             Diet Carb Modified Fluid consistency: Thin; Room service appropriate? Yes  Diet effective now                   EDUCATION NEEDS:   Not appropriate for education at this time  Skin:  Skin Assessment: Reviewed RN Assessment (incision L hip)  Last BM:  7/25 - type 4  Height:   Ht Readings from Last 1 Encounters:  11/14/22 5\' 8"  (1.727 m)    Weight:   Wt Readings from Last 1 Encounters:  11/21/22 95.3 kg    Ideal Body Weight:  70 kg  BMI:  Body mass index is 31.95 kg/m.  Estimated Nutritional Needs:   Kcal:  1800-2100kcal/day  Protein:  90-105g/day  Fluid:  1.8-2.1L/day  Bethann Humble, RD, LDN, CNSC.

## 2022-11-21 NOTE — TOC Progression Note (Signed)
Transition of Care [facility]) - Progression Note    Patient Details  Name: Neamiah Sciarra MRN: [id number] Date of Birth: [birth year]  Transition of [facility]) CM/SW Contact  Marlowe Sax, RN Phone Number: 11/21/2022, 2:41 PM  Clinical Narrative:     Ins Berkley Harvey is pending to go to [facility], ref number [id number]  Expected Discharge Plan: Skilled Nursing Facility Barriers to Discharge: SNF Pending bed offer, Insurance Authorization  Expected Discharge Plan and Services                                               Social Determinants of Health (SDOH) Interventions SDOH Screenings   Tobacco Use: Low Risk  (11/14/2022)    Readmission Risk Interventions     No data to display

## 2022-11-21 NOTE — Care Management Important Message (Signed)
Important Message  Patient Details  Name: Darryl Haley MRN: [id number] Date of Birth: [birth year]   Medicare Important Message Given:  Yes  POA, Jacqualin Combes returned my call and I reviewed the Important Message from Medicare with her and she stated she understood her rights. I thanked her for returning my call.   Olegario Messier A Janesia Joswick 11/21/2022, 3:39 PM

## 2022-11-21 NOTE — TOC Progression Note (Signed)
Transition of Care [facility]) - Progression Note    Patient Details  Name: [name] MRN: [id number] Date of Birth: 1929-10-27  Transition of Care [facility]) CM/SW Contact  Marlowe Sax, RN Phone Number: 11/21/2022, 2:05 PM  Clinical Narrative:    Spoke with the patient's daughter Kathie Rhodes, They chose [facility], I notified [facility] and Ins is pending   Expected Discharge Plan: Skilled Nursing Facility Barriers to Discharge: SNF Pending bed offer, Insurance Authorization  Expected Discharge Plan and Services                                               Social Determinants of Health (SDOH) Interventions SDOH Screenings   Tobacco Use: Low Risk  (11/14/2022)    Readmission Risk Interventions     No data to display

## 2022-11-21 NOTE — Progress Notes (Signed)
  Progress Note   Patient: Darryl Haley [name] DOB: 01-21-1930 DOA: 11/14/2022     7 DOS: the patient was seen and examined on 11/21/2022   Brief hospital course: Jigar Zielke is a 89 y.o. male with medical history significant for Mild cognitive deficit, hospitalized in 2021 under IVC for confusion and paranoid ideation as well as new onset rapid A-fib in the setting of acute COVID-pneumonia with acute PE, who still lives independently but whose daughter checks in on him frequently who presents to the ED following a fall.  Left hip showing comminuted displaced intertrochanteric fracture of the left femur S/p Reduction and internal fixation of displaced intertrochanteric left hip fracture with Biomet Affixis TFN nail 7/19.    Principal Problem:   Closed left hip fracture, initial encounter [facility]) Active Problems:   Accidental fall   History of atrial fibrillation   Primary hypertension   Dementia without behavioral disturbance (HCC)   Paroxysmal atrial fibrillation (HCC)   Obesity (BMI 30-39.9)   Hypophosphatemia   Acute urinary retention   Assessment and Plan: # Closed Left hip intertrochanteric fracture due to mechanical Fall S/p Reduction and internal fixation of displaced intertrochanteric left hip fracture with Biomet Affixis TFN nail. Continue pain meds Patient still pending nursing home placement.  Urinary retention secondary to benign prostate hypertrophy. Patient had a significant urinary retention, Foley catheter was anchored on 7/23.  Flomax started.   Paroxysmal atrial fibrillation Restarting Eliquis.   # Primary hypertension Continue to monitor.   # Dementia without behavioral disturbance  # History of paranoid thought content Patient still could not sleep after taking Seroquel, added melatonin.   # Vitamin D deficiency: started vitamin D 50,000 units p.o. weekly, follow with PCP to repeat vitamin D level after 3 to 6 months.   # Vitamin B12 deficiency:  Started vitamin B12 1000 mcg IM injection daily during hospital stay, followed by oral supplement.  Follow-up PCP to repeat vitamin B12 level after 3 to 6 months.   # Hypophosphatemia,  Improved.      Subjective:  Patient has no complaint today.  Physical Exam: Vitals:   11/20/22 1542 11/21/22 0116 11/21/22 0500 11/21/22 0735  BP: (!) 152/72 (!) 144/64  (!) 155/69  Pulse: 86 79  74  Resp: 17 16  16   Temp: (!) 97.4 F (36.3 C) 98.1 F (36.7 C)  98.3 F (36.8 C)  TempSrc:      SpO2: 100% 96%  96%  Weight:   95.3 kg   Height:       General exam: Appears calm and comfortable  Respiratory system: Clear to auscultation. Respiratory effort normal. Cardiovascular system: S1 & S2 heard, RRR. No JVD, murmurs, rubs, gallops or clicks. No pedal edema. Gastrointestinal system: Abdomen is nondistended, soft and nontender. No organomegaly or masses felt. Normal bowel sounds heard. Central nervous system: Alert and oriented x1. No focal neurological deficits. Extremities: Symmetric 5 x 5 power. Skin: No rashes, lesions or ulcers Psychiatry:  Mood & affect appropriate.    Data Reviewed:  There are no new results to review at this time.  Family Communication: Son-in-law updated at bedside.  Disposition: Status is: Inpatient Remains inpatient appropriate because: Unsafe discharge options, pending nursing home placement.     Time spent: 35 minutes  Author: Marrion Coy, MD 11/21/2022 10:37 AM  For on call review [email or web address].

## 2022-11-22 DIAGNOSIS — S72002A Fracture of unspecified part of neck of left femur, initial encounter for closed fracture: Secondary | ICD-10-CM | POA: Diagnosis not present

## 2022-11-22 DIAGNOSIS — I480 Paroxysmal atrial fibrillation: Secondary | ICD-10-CM | POA: Diagnosis not present

## 2022-11-22 DIAGNOSIS — R410 Disorientation, unspecified: Secondary | ICD-10-CM | POA: Diagnosis not present

## 2022-11-22 MED ORDER — CHLORHEXIDINE GLUCONATE CLOTH 2 % EX PADS
6.0000 | MEDICATED_PAD | Freq: Every day | CUTANEOUS | Status: DC
  Administered 2022-11-22 – 2022-11-23 (×2): 6 via TOPICAL

## 2022-11-22 MED ORDER — QUETIAPINE FUMARATE 25 MG PO TABS
50.0000 mg | ORAL_TABLET | Freq: Every evening | ORAL | Status: DC
  Administered 2022-11-22: 50 mg via ORAL
  Filled 2022-11-22: qty 2

## 2022-11-22 NOTE — Plan of Care (Signed)
  Problem: Clinical Measurements: Goal: Ability to maintain clinical measurements within normal limits will improve Outcome: Progressing Goal: Will remain free from infection Outcome: Progressing Goal: Diagnostic test results will improve Outcome: Progressing Goal: Respiratory complications will improve Outcome: Progressing Goal: Cardiovascular complication will be avoided Outcome: Progressing   Problem: Activity: Goal: Risk for activity intolerance will decrease Outcome: Progressing   Problem: Nutrition: Goal: Adequate nutrition will be maintained Outcome: Progressing   Problem: Coping: Goal: Level of anxiety will decrease Outcome: Progressing   Problem: Elimination: Goal: Will not experience complications related to bowel motility Outcome: Progressing Goal: Will not experience complications related to urinary retention Outcome: Progressing   Problem: Pain Managment: Goal: General experience of comfort will improve Outcome: Progressing   Problem: Safety: Goal: Ability to remain free from injury will improve Outcome: Progressing   Problem: Skin Integrity: Goal: Risk for impaired skin integrity will decrease Outcome: Progressing   Problem: Education: Goal: Knowledge of General Education information will improve Description: Including pain rating scale, medication(s)/side effects and non-pharmacologic comfort measures Outcome: Not Progressing Note: Patient experiencing confusion at this time. Will continue to educate and reorient as needed.   Problem: Health Behavior/Discharge Planning: Goal: Ability to manage health-related needs will improve Outcome: Not Progressing Note: Patient experiencing confusion at this time. Will continue to educate and reorient as needed.   

## 2022-11-22 NOTE — TOC Progression Note (Signed)
Transition of Care [facility]) - Progression Note    Patient Details  Name: Darryl Haley MRN: [id number] Date of Birth: 04/05/1930  [facility]) CM/SW Contact  Marlowe Sax, RN Phone Number: 11/22/2022, 9:15 AM  Clinical Narrative:     Ins approvd to go to [facility], [phone]  Expected Discharge Plan: Skilled Nursing Facility Barriers to Discharge: SNF Pending bed offer, Insurance Authorization  Expected Discharge Plan and Services                                               Social Determinants of Health (SDOH) Interventions SDOH Screenings   Tobacco Use: Low Risk  (11/14/2022)    Readmission Risk Interventions     No data to display

## 2022-11-22 NOTE — Progress Notes (Signed)
Physical Therapy Treatment Patient Details Name: Darryl Haley MRN: [id number] DOB: 1930-03-29 Today's Date: 11/22/2022   History of Present Illness Pt is a 89 y/o M admitted on 11/14/22 after presenting with c/o a fall. Pt found to have closed L hip fx & is s/p IM nail on 11/14/22. PMH: mild cognitive deficit, a-fib    PT Comments  Pt was long sitting in bed with supportive daughter at bedside. He is A but disoriented. Cooperative but more liked today due to pain. He was able to exit L side of bed with max assist of one. Stood form elevated bed height 3 x with +2 assistance. While in standing, RN changed incision dressing and hygiene care was performed after pt had BM.  Acute PT will continue to follow per current POC.     Assistance Recommended at Discharge Frequent or constant Supervision/Assistance  If plan is discharge home, recommend the following:  Can travel by private vehicle    A lot of help with bathing/dressing/bathroom;Two people to help with walking and/or transfers;Direct supervision/assist for medications management;Help with stairs or ramp for entrance;Assist for transportation;Assistance with cooking/housework;Direct supervision/assist for financial management      Equipment Recommendations  Other (comment) (Defer to next level of care)       Precautions / Restrictions Precautions Precautions: Fall Restrictions Weight Bearing Restrictions: Yes LLE Weight Bearing: Weight bearing as tolerated     Mobility  Bed Mobility Overal bed mobility: Needs Assistance Bed Mobility: Supine to Sit, Sit to Supine  Supine to sit: Max assist Sit to supine: Max assist General bed mobility comments: more assistance today due to pain    Transfers Overall transfer level: Needs assistance Equipment used: Rolling walker (2 wheels) Transfers: Sit to/from Stand Sit to Stand: Max assist, +2 safety/equipment, From elevated surface, +2 physical assistance  General transfer comment: Pt  required +2 assistance to stand. More today than previous date. Author feels its due to pain increased pain. RN issued pain meds during session. stood 3 x EOB for hygiene care to be performed. Bed height extremely elevated.    Ambulation/Gait  Gait velocity: decreased  General Gait Details: unable to progress away form EOB due to pain increase in static standing. Cognition also played large role in limited progress.    Balance Overall balance assessment: Needs assistance Sitting-balance support: Feet supported Sitting balance-[name] Scale: Good     Standing balance support: Bilateral upper extremity supported, During functional activity, Reliant on assistive device for balance Standing balance-[name] Scale: Poor    Cognition Arousal/Alertness: Awake/alert Behavior During Therapy: WFL for tasks assessed/performed Overall Cognitive Status: History of cognitive impairments - at baseline   General Comments: Pt was A but disoriented. Cooperative however lack insight of situation.               Pertinent Vitals/Pain Pain Assessment Pain Assessment: PAINAD Breathing: occasional labored breathing, short period of hyperventilation Negative Vocalization: occasional moan/groan, low speech, negative/disapproving quality Facial Expression: facial grimacing Body Language: tense, distressed pacing, fidgeting Consolability: distracted or reassured by voice/touch PAINAD Score: 6 Pain Location: L hip Pain Descriptors / Indicators: Guarding, Sore Pain Intervention(s): Limited activity within patient's tolerance, Monitored during session, Premedicated before session, Repositioned, RN gave pain meds during session     PT Goals (current goals can now be found in the care plan section) Acute Rehab PT Goals Patient Stated Goal: none stated Progress towards PT goals: Progressing toward goals    Frequency    7X/week      PT  Plan Current plan remains appropriate    Co-evaluation     PT  goals addressed during session: Mobility/safety with mobility;Balance;Proper use of DME;Strengthening/ROM        AM-PAC PT "6 Clicks" Mobility   Outcome Measure  Help needed turning from your back to your side while in a flat bed without using bedrails?: A Little Help needed moving from lying on your back to sitting on the side of a flat bed without using bedrails?: A Lot Help needed moving to and from a bed to a chair (including a wheelchair)?: Total Help needed standing up from a chair using your arms (e.g., wheelchair or bedside chair)?: Total Help needed to walk in hospital room?: Total Help needed climbing 3-5 steps with a railing? : Total 6 Click Score: 9    End of Session Equipment Utilized During Treatment: Gait belt Activity Tolerance: Patient tolerated treatment well Patient left: in bed;with call bell/phone within reach;with bed alarm set;with nursing/sitter in room;with family/visitor present Nurse Communication: Mobility status PT Visit Diagnosis: Muscle weakness (generalized) (M62.81);Pain;Unsteadiness on feet (R26.81);Difficulty in walking, not elsewhere classified (R26.2) Pain - Right/Left: Left Pain - part of body: Leg     Time: 1610-9604 PT Time Calculation (min) (ACUTE ONLY): 20 min  Charges:    $Therapeutic Activity: 8-22 mins PT General Charges $$ ACUTE PT VISIT: 1 Visit                     Jetta Lout PTA 11/22/22, 4:02 PM

## 2022-11-22 NOTE — Progress Notes (Signed)
Progress Note   Patient: Darryl Haley CWC:[id number] DOB: 1929/10/11 DOA: 11/14/2022     8 DOS: the patient was seen and examined on 11/22/2022   Brief hospital course: Darryl Haley is a 89 y.o. male with medical history significant for Mild cognitive deficit, hospitalized in 2021 under IVC for confusion and paranoid ideation as well as new onset rapid A-fib in the setting of acute COVID-pneumonia with acute PE, who still lives independently but whose daughter checks in on him frequently who presents to the ED following a fall.  Left hip showing comminuted displaced intertrochanteric fracture of the left femur S/p Reduction and internal fixation of displaced intertrochanteric left hip fracture with Biomet Affixis TFN nail 7/19.    Principal Problem:   Closed left hip fracture, initial encounter [facility]) Active Problems:   Accidental fall   History of atrial fibrillation   Primary hypertension   Delirium with dementia   Paroxysmal atrial fibrillation (HCC)   Obesity (BMI 30-39.9)   Hypophosphatemia   Acute urinary retention   Assessment and Plan:   Closed Left hip intertrochanteric fracture due to mechanical Fall S/p Reduction and internal fixation of displaced intertrochanteric left hip fracture with Biomet Affixis TFN nail. Continue pain meds Patient has been accepted to nursing home rehab, however, will not be able to discharge today due to worsening delirium.  Delirium with dementia. Patient was very agitated yesterday, received IM Haldol.  Patient also could not sleep last night, was agitated again. Will continue as needed Haldol, increase Seroquel to 50 mg every evening in addition to 5 mg of melatonin.  Not able to discharge.  Urinary retention secondary to benign prostate hypertrophy. Patient had a significant urinary retention, Foley catheter was anchored on 7/23.  Flomax started.  UA does not support a urinary tract infection.   Paroxysmal atrial fibrillation Restarted  Eliquis.   # Primary hypertension Continue to monitor.    # Vitamin D deficiency:  Repleted.   # Vitamin B12 deficiency: Started vitamin B12 1000 mcg IM injection daily during hospital stay, followed by oral supplement.  Follow-up PCP to repeat vitamin B12 level after 3 to 6 months.   # Hypophosphatemia,  Improved.     Subjective:  Patient could not sleep last night, was agitated yesterday and last night.  Confused as at baseline.  Physical Exam: Vitals:   11/21/22 0735 11/21/22 1608 11/21/22 2350 11/22/22 0922  BP: (!) 155/69 (!) 142/63 (!) 130/58 (!) 112/58  Pulse: 74 76 86 (!) 57  Resp: 16 16 20 16   Temp: 98.3 F (36.8 C) (!) 97.4 F (36.3 C) 97.6 F (36.4 C) (!) 97.5 F (36.4 C)  TempSrc:      SpO2: 96% 100% 98% 100%  Weight:      Height:       General exam: Appears calm and comfortable  Respiratory system: Clear to auscultation. Respiratory effort normal. Cardiovascular system: S1 & S2 heard, RRR. No JVD, murmurs, rubs, gallops or clicks. No pedal edema. Gastrointestinal system: Abdomen is nondistended, soft and nontender. No organomegaly or masses felt. Normal bowel sounds heard. Central nervous system: Alert and oriented x1. No focal neurological deficits. Extremities: Symmetric 5 x 5 power. Skin: No rashes, lesions or ulcers Psychiatry: Flat affect.  Data Reviewed:  Lab results reviewed.  Family Communication:: Grandson updated at bedside.  Disposition: Status is: Inpatient Remains inpatient appropriate because: Severity of disease, altered mental status.     Time spent: 35 minutes  Author: Marrion Coy, MD 11/22/2022 10:44 AM  For on call review [email or web address].

## 2022-11-22 NOTE — Plan of Care (Signed)
°  Problem: Clinical Measurements: Goal: Will remain free from infection Outcome: Progressing   Problem: Nutrition: Goal: Adequate nutrition will be maintained Outcome: Progressing   Problem: Coping: Goal: Level of anxiety will decrease Outcome: Progressing

## 2022-11-22 NOTE — Progress Notes (Signed)
OT Cancellation Note  Patient Details Name: Kyryn Bonnici MRN: [id number] DOB: [birth year]   Cancelled Treatment:    Reason Eval/Treat Not Completed: Other (comment). Family requesting hold therapy today 2/2 not sleeping well. Will re-attempt at later date/time as appropriate.   Arman Filter., MPH, MS, OTR/L ascom [phone] 11/22/22, 10:36 AM

## 2022-11-23 DIAGNOSIS — S72002A Fracture of unspecified part of neck of left femur, initial encounter for closed fracture: Secondary | ICD-10-CM | POA: Diagnosis not present

## 2022-11-23 DIAGNOSIS — I480 Paroxysmal atrial fibrillation: Secondary | ICD-10-CM | POA: Diagnosis not present

## 2022-11-23 DIAGNOSIS — R410 Disorientation, unspecified: Secondary | ICD-10-CM | POA: Diagnosis not present

## 2022-11-23 DIAGNOSIS — I10 Essential (primary) hypertension: Secondary | ICD-10-CM

## 2022-11-23 MED ORDER — VITAMIN D (ERGOCALCIFEROL) 1.25 MG (50000 UNIT) PO CAPS: 50000.0000 [IU] | ORAL_CAPSULE | ORAL | Status: AC

## 2022-11-23 MED ORDER — CYANOCOBALAMIN 1000 MCG PO TABS: 1000.0000 ug | ORAL_TABLET | Freq: Every day | ORAL | Status: AC

## 2022-11-23 MED ORDER — QUETIAPINE FUMARATE 50 MG PO TABS: 50.0000 mg | ORAL_TABLET | Freq: Every evening | ORAL | Status: AC

## 2022-11-23 MED ORDER — TAMSULOSIN HCL 0.4 MG PO CAPS: 0.4000 mg | ORAL_CAPSULE | Freq: Every day | ORAL | Status: AC

## 2022-11-23 MED ORDER — MELATONIN 5 MG PO TABS: 5.0000 mg | ORAL_TABLET | Freq: Every day | ORAL | Status: AC

## 2022-11-23 MED ORDER — ORAL CARE MOUTH RINSE: 15.0000 mL | OROMUCOSAL | Status: DC | PRN

## 2022-11-23 NOTE — Discharge Summary (Signed)
Physician Discharge Summary   Patient: Darryl Haley MRN: [id number] DOB: 11-09-1929  Admit date:     11/14/2022  Discharge date: 11/23/22  Discharge Physician: Marrion Coy   PCP: Pcp, No   Recommendations at discharge:   Follow-up with PCP in 1 week. Follow-up with urology in 1 week to remove Foley catheter and voiding trial. Follow-up with orthopedics as scheduled.  Discharge Diagnoses: Principal Problem:   Closed left hip fracture, initial encounter [facility]) Active Problems:   Accidental fall   History of atrial fibrillation   Primary hypertension   Delirium with dementia   Paroxysmal atrial fibrillation (HCC)   Obesity (BMI 30-39.9)   Hypophosphatemia   Acute urinary retention Mild hyponatremia. Acute blood loss anemia, mild. Other diagnosis see assessment and plan. Resolved Problems:   * No resolved hospital problems. *  Hospital Course: Darryl Haley is a 89 y.o. male with medical history significant for Mild cognitive deficit, hospitalized in 2021 under IVC for confusion and paranoid ideation as well as new onset rapid A-fib in the setting of acute COVID-pneumonia with acute PE, who still lives independently but whose daughter checks in on him frequently who presents to the ED following a fall.  Left hip showing comminuted displaced intertrochanteric fracture of the left femur S/p Reduction and internal fixation of displaced intertrochanteric left hip fracture with [employer] TFN nail 7/19.   Assessment and Plan:  Closed Left hip intertrochanteric fracture due to mechanical Fall S/p Reduction and internal fixation of displaced intertrochanteric left hip fracture with [employer] TFN nail. Continue pain meds Condition improved, medically stable for discharge.   Delirium with dementia. Patient was very agitated, could not sleep at night.  He is treated with the Seroquel 50 mg every evening and melatonin 5 mg every evening.  He slept better last night, no agitation for  the last 24 hours, medically stable for transfer to nursing home.   Urinary retention secondary to benign prostate hypertrophy. Patient had a significant urinary retention, Foley catheter was anchored on 7/23.  Flomax started.  UA does not support a urinary tract infection. At this point, patient is medically stable for transfer to nursing home.  Will keep Foley catheter for 1 week, continue Flomax, follow-up with urology in 1 week time to remove the Foley catheter and perform voiding trial.   Paroxysmal atrial fibrillation Restarted Eliquis.   # Primary hypertension Continue to monitor.     # Vitamin D deficiency:  Repleted.   # Vitamin B12 deficiency: Started vitamin B12 1000 mcg IM injection daily during hospital stay, followed by oral supplement.  Follow-up PCP to repeat vitamin B12 level after 3 to 6 months.   # Hypophosphatemia,  Improved.       Consultants: Orthopedics. Procedures performed: Hip surgery. Disposition: Skilled nursing facility Diet recommendation:  Discharge Diet Orders (From admission, onward)     Start     Ordered   11/23/22 0000  Diet - low sodium heart healthy        11/23/22 0953           Cardiac diet DISCHARGE MEDICATION: Allergies as of 11/23/2022   No Known Allergies      Medication List     STOP taking these medications    albuterol 108 (90 Base) MCG/ACT inhaler Commonly known as: VENTOLIN HFA       TAKE these medications    acetaminophen 500 MG tablet Commonly known as: TYLENOL Take 1 tablet (500 mg total) by mouth every 8 (  eight) hours.   apixaban 5 MG Tabs tablet Commonly known as: ELIQUIS Take 1 tablet (5 mg total) by mouth 2 (two) times daily.   cyanocobalamin 1000 MCG tablet Take 1 tablet (1,000 mcg total) by mouth daily. Start taking on: November 24, 2022   HYDROcodone-acetaminophen 5-325 MG tablet Commonly known as: NORCO/VICODIN Take 0.5-1 tablets by mouth every 6 (six) hours as needed for moderate pain.    melatonin 5 MG Tabs Take 1 tablet (5 mg total) by mouth at bedtime.   metoprolol tartrate 25 MG tablet Commonly known as: LOPRESSOR Take 0.5 tablets (12.5 mg total) by mouth 2 (two) times daily.   mirtazapine 15 MG tablet Commonly known as: REMERON Take 1 tablet (15 mg total) by mouth at bedtime.   QUEtiapine 50 MG tablet Commonly known as: SEROQUEL Take 1 tablet (50 mg total) by mouth every evening.   tamsulosin 0.4 MG Caps capsule Commonly known as: FLOMAX Take 1 capsule (0.4 mg total) by mouth daily. Start taking on: November 24, 2022   Vitamin D (Ergocalciferol) 1.25 MG (50000 UNIT) Caps capsule Commonly known as: DRISDOL Take 1 capsule (50,000 Units total) by mouth every 7 (seven) days. Start taking on: November 29, 2022        Contact information for follow-up providers     Anson Oregon, PA-C Follow up in 14 day(s).   Specialty: Physician Assistant Why: Mindi Slicker information: [address] [place] [address] [phone]         Riki Altes, MD Follow up in 1 week(s).   Specialty: Urology Contact information: [address] [place] [address] [phone]              Contact information for after-discharge care     Destination     [facility] [address] [place] [facility] Preferred SNF .   Service: Skilled Nursing Contact information: [address] [place] [address] [phone]                    Discharge Exam: Filed Weights   11/20/22 0500 11/21/22 0500 11/23/22 0500  Weight: 92 kg 95.3 kg 95.5 kg   General exam: Appears calm and comfortable  Respiratory system: Clear to auscultation. Respiratory effort normal. Cardiovascular system: Regular. No JVD, murmurs, rubs, gallops or clicks. No pedal edema. Gastrointestinal system: Abdomen is nondistended, soft and nontender. No organomegaly or masses felt. Normal bowel sounds heard. Central nervous system: Alert and  oriented x1. No focal neurological deficits. Extremities: Symmetric 5 x 5 power. Skin: No rashes, lesions or ulcers Psychiatry: Flat affect.   Condition at discharge: fair  The results of significant diagnostics from this hospitalization (including imaging, microbiology, ancillary and laboratory) are listed below for reference.   Imaging Studies: DG FEMUR MIN 2 VIEWS LEFT  Result Date: 11/14/2022 CLINICAL DATA:  Fluoroscopic assistance for internal fixation EXAM: LEFT FEMUR 2 VIEWS COMPARISON:  Study done earlier today FINDINGS: Fluoroscopic images show reduction and internal fixation of comminuted fracture of the proximal left femur. The intramedullary rod and surgical screw in the neck are noted. Fluoroscopic time 1 minutes and 30 seconds. Radiation dose 13.68 mGy. IMPRESSION: Fluoroscopic assistance was provided for internal fixation of intertrochanteric fracture of left femur. Electronically Signed   By: Ernie Avena M.D.   On: 11/14/2022 16:19   DG C-Arm 1-60 Min-No Report  Result Date: 11/14/2022 Fluoroscopy was utilized by the requesting physician.  No radiographic interpretation.   CT HEAD WO  CONTRAST ( )  Result Date: 11/14/2022 CLINICAL DATA:  fall, eval ich EXAM: CT HEAD WITHOUT CONTRAST TECHNIQUE: Contiguous axial images were obtained from the base of the skull through the vertex without intravenous contrast. RADIATION DOSE REDUCTION: This exam was performed according to the departmental dose-optimization program which includes automated exposure control, adjustment of the mA and/or kV according to patient size and/or use of iterative reconstruction technique. COMPARISON:  CT head 03/24/2020 FINDINGS: Brain: Cerebral ventricle sizes are concordant with the degree of cerebral volume loss. Patchy and confluent areas of decreased attenuation are noted throughout the deep and periventricular Boehne matter of the cerebral hemispheres bilaterally, compatible with chronic  microvascular ischemic disease. No evidence of large-territorial acute infarction. No parenchymal hemorrhage. No mass lesion. No extra-axial collection. No mass effect or midline shift. No hydrocephalus. Basilar cisterns are patent. Vascular: No hyperdense vessel. Skull: No acute fracture or focal lesion. Sinuses/Orbits: Almost complete opacification of the right sphenoid sinus. Otherwise paranasal sinuses and mastoid air cells are clear. The orbits are unremarkable. Other: None. IMPRESSION: No acute intracranial abnormality. Electronically Signed   By: Tish Frederickson M.D.   On: 11/14/2022 01:22   DG HIP UNILAT WITH PELVIS 2-3 VIEWS LEFT  Result Date: 11/14/2022 CLINICAL DATA:  Fall today with obvious deformity to left leg. Pain from hip to knee. EXAM: DG HIP (WITH OR WITHOUT PELVIS) 2-3V LEFT COMPARISON:  None Available. FINDINGS: Acute comminuted displaced intertrochanteric fracture of the left femur. Slight superolateral displacement of the distal fragment. No dislocation. Degenerative changes pubic symphysis, both hips, SI joints and lower lumbar spine. IMPRESSION: Comminuted displaced intertrochanteric fracture of the left femur. Electronically Signed   By: Minerva Fester M.D.   On: 11/14/2022 01:12   DG Knee Complete 4 Views Left  Result Date: 11/14/2022 CLINICAL DATA:  fall, left leg is shortened and rotated. complaining of knee pain. eval fx EXAM: LEFT KNEE - COMPLETE 4+ VIEW COMPARISON:  None Available. FINDINGS: No evidence of fracture, dislocation, or joint effusion. Tricompartmental severe degenerative changes of the knee. Soft tissues are unremarkable. Vascular calcifications. IMPRESSION: No acute displaced fracture or dislocation. Electronically Signed   By: Tish Frederickson M.D.   On: 11/14/2022 01:12   DG Chest 1 View  Result Date: 11/14/2022 CLINICAL DATA:  [id number] Fall [id number] EXAM: CHEST  1 VIEW COMPARISON:  Chest x-ray 03/24/2020 FINDINGS: The heart and mediastinal contours are within  normal limits. Atherosclerotic plaque. Left base atelectasis. Slightly elevated left hemidiaphragm. No focal consolidation. No pulmonary edema. No pleural effusion. No pneumothorax. No acute osseous abnormality. IMPRESSION: 1. Left base atelectasis with no active disease. 2.  Aortic Atherosclerosis (ICD10-I70.0). Electronically Signed   By: Tish Frederickson M.D.   On: 11/14/2022 01:04    Microbiology: Results for orders placed or performed during the hospital encounter of 03/24/20  Resp Panel by RT-PCR (Flu A&B, Covid) Nasopharyngeal Swab     Status: Abnormal   Collection Time: 03/24/20  7:17 PM   Specimen: Nasopharyngeal Swab; Nasopharyngeal(NP) swabs in vial transport medium  Result Value Ref Range Status   SARS Coronavirus 2 by RT PCR POSITIVE (A) NEGATIVE Final    Comment: RESULT CALLED TO, READ BACK BY AND VERIFIED WITH: MAC BROWN,RN 2101 03/24/2020 DB (NOTE) SARS-CoV-2 target nucleic acids are DETECTED.  The SARS-CoV-2 RNA is generally detectable in upper respiratory specimens during the acute phase of infection. Positive results are indicative of the presence of the identified virus, but do not rule out bacterial infection or co-infection with other pathogens not  detected by the test. Clinical correlation with patient history and other diagnostic information is necessary to determine patient infection status. The expected result is Negative.  Fact Sheet for Patients: BloggerCourse.com  Fact Sheet for Healthcare Providers: SeriousBroker.it  This test is not yet approved or cleared by the [place] FDA and  has been authorized for detection and/or diagnosis of SARS-CoV-2 by FDA under an Emergency Use Authorization (EUA).  This EUA will remain in effect (meaning this test can be u sed) for the duration of  the COVID-19 declaration under Section 564(b)(1) of the Act, 21 U.S.C. section 360bbb-3(b)(1), unless the authorization  is terminated or revoked sooner.     Influenza A by PCR NEGATIVE NEGATIVE Final   Influenza B by PCR NEGATIVE NEGATIVE Final    Comment: (NOTE) The Xpert Xpress SARS-CoV-2/FLU/RSV plus assay is intended as an aid in the diagnosis of influenza from Nasopharyngeal swab specimens and should not be used as a sole basis for treatment. Nasal washings and aspirates are unacceptable for Xpert Xpress SARS-CoV-2/FLU/RSV testing.  Fact Sheet for Patients: BloggerCourse.com  Fact Sheet for Healthcare Providers: SeriousBroker.it  This test is not yet approved or cleared by the [place] FDA and has been authorized for detection and/or diagnosis of SARS-CoV-2 by FDA under an Emergency Use Authorization (EUA). This EUA will remain in effect (meaning this test can be used) for the duration of the COVID-19 declaration under Section 564(b)(1) of the Act, 21 U.S.C. section 360bbb-3(b)(1), unless the authorization is terminated or revoked.  Performed at [facility], [address] [place], [place] [address]     Labs: CBC: Recent Labs  Lab 11/17/22 [phone] 11/18/22 0552 11/19/22 0435 11/20/22 0504  WBC 10.8* 10.7* 11.1* 9.9  HGB 11.1* 11.2* 11.5* 11.2*  HCT 32.7* 32.7* 33.4* 32.8*  MCV 88.4 87.7 87.0 87.7  PLT 212 142* 309 254   Basic Metabolic Panel: Recent Labs  Lab 11/17/22 0454 11/18/22 0552 11/19/22 0435 11/20/22 0504  NA 134* 135 131* 133*  K 3.9 3.9 3.6 4.0  CL 101 100 97* 100  CO2 27 26 26 24   GLUCOSE 116* 117* 112* 112*  BUN 21 24* 22 25*  CREATININE 1.14 1.04 0.98 0.98  CALCIUM 7.7* 7.5* 7.6* 7.8*  MG 2.2  --   --  2.6*  PHOS 2.8  --   --  2.9   Liver Function Tests: No results for input(s): "AST", "ALT", "ALKPHOS", "BILITOT", "PROT", "ALBUMIN" in the last 168 hours. CBG: No results for input(s): "GLUCAP" in the last 168 hours.  Discharge time spent: greater than 30 minutes.  Signed: Marrion Coy, MD [facility] 11/23/2022

## 2022-11-23 NOTE — TOC Progression Note (Signed)
Transition of Care [facility]) - Progression Note    Patient Details  Name: Darryl Haley MRN: [id number] Date of Birth: 01/06/1930  Transition of [facility]) CM/SW Contact  Kemper Durie, RN Phone Number: 11/23/2022, 9:45 AM  Clinical Narrative:     Confirmed with [facility] that patient is able to be discharged to SNF today.   Expected Discharge Plan: Skilled Nursing Facility Barriers to Discharge: SNF Pending bed offer, Insurance Authorization  Expected Discharge Plan and Services                                               Social Determinants of Health (SDOH) Interventions SDOH Screenings   Tobacco Use: Low Risk  (11/14/2022)    Readmission Risk Interventions     No data to display

## 2022-11-23 NOTE — Plan of Care (Signed)
  Problem: Clinical Measurements: Goal: Ability to maintain clinical measurements within normal limits will improve Outcome: Progressing Goal: Will remain free from infection Outcome: Progressing Goal: Diagnostic test results will improve Outcome: Progressing Goal: Respiratory complications will improve Outcome: Progressing Goal: Cardiovascular complication will be avoided Outcome: Progressing   Problem: Activity: Goal: Risk for activity intolerance will decrease Outcome: Progressing   Problem: Nutrition: Goal: Adequate nutrition will be maintained Outcome: Progressing   Problem: Coping: Goal: Level of anxiety will decrease Outcome: Progressing   Problem: Elimination: Goal: Will not experience complications related to bowel motility Outcome: Progressing Goal: Will not experience complications related to urinary retention Outcome: Progressing   Problem: Pain Managment: Goal: General experience of comfort will improve Outcome: Progressing   Problem: Safety: Goal: Ability to remain free from injury will improve Outcome: Progressing   Problem: Skin Integrity: Goal: Risk for impaired skin integrity will decrease Outcome: Progressing   Problem: Education: Goal: Knowledge of General Education information will improve Description: Including pain rating scale, medication(s)/side effects and non-pharmacologic comfort measures Outcome: Not Progressing Note: Patient experiencing confusion at this time. Will continue to educate and reorient as needed.   Problem: Health Behavior/Discharge Planning: Goal: Ability to manage health-related needs will improve Outcome: Not Progressing Note: Patient experiencing confusion at this time. Will continue to educate and reorient as needed.   

## 2022-11-23 NOTE — TOC Transition Note (Signed)
Transition of Care [facility]) - CM/SW Discharge Note   Patient Details  Name: Darryl Haley MRN: [id number] Date of Birth: April 26, 1930  Transition of Care [facility]) CM/SW Contact:  Kemper Durie, RN Phone Number: 11/23/2022, 10:45 AM   Clinical Narrative:     Discharge summary sent to [facility], patient will be going to room 808.  Bedside nurse called report, patient ready for transport.  Non-emergent EMS transport called, paperwork completed and printed.  Daughter Kathie Rhodes aware patient will be discharging today.    Final next level of care: Skilled Nursing Facility Barriers to Discharge: Barriers Resolved   Patient Goals and CMS Choice      Discharge Placement                Patient chooses bed at: [facility] [place] Patient to be transferred to facility by: Non-emergent  EMS Name of family member notified: Kathie Rhodes, daughter Patient and family notified of of transfer: 11/23/22  Discharge Plan and Services Additional resources added to the After Visit Summary for                                       Social Determinants of Health (SDOH) Interventions SDOH Screenings   Tobacco Use: Low Risk  (11/14/2022)     Readmission Risk Interventions     No data to display

## 2022-12-10 ENCOUNTER — Ambulatory Visit: Payer: Self-pay | Admitting: Urology

## 2022-12-31 ENCOUNTER — Ambulatory Visit: Payer: Medicare PPO | Admitting: Urology

## 2022-12-31 ENCOUNTER — Telehealth: Payer: Self-pay

## 2022-12-31 VITALS — BP 138/81 | HR 65 | Wt 188.1 lb

## 2022-12-31 DIAGNOSIS — N9989 Other postprocedural complications and disorders of genitourinary system: Secondary | ICD-10-CM | POA: Diagnosis not present

## 2022-12-31 DIAGNOSIS — R339 Retention of urine, unspecified: Secondary | ICD-10-CM

## 2022-12-31 DIAGNOSIS — Z466 Encounter for fitting and adjustment of urinary device: Secondary | ICD-10-CM

## 2022-12-31 MED ORDER — CEPHALEXIN 250 MG PO CAPS
500.0000 mg | ORAL_CAPSULE | Freq: Once | ORAL | Status: AC
Start: 2022-12-31 — End: 2022-12-31
  Administered 2022-12-31: 500 mg via ORAL

## 2022-12-31 NOTE — Progress Notes (Signed)
Catheter Removal  Patient is present today for a catheter removal. 8ml of water was drained from the balloon. A 16FR coude foley cath was removed from the bladder, no complications were noted. Patient tolerated well.  Performed by: Debbe Bales, CMA  Follow up/ Additional notes: RTC for afternoon PVR

## 2022-12-31 NOTE — Telephone Encounter (Signed)
Called pt's nurse at [facility] to inquire about patient missing afternoon appointment. Selena Batten states that the patient has dementia and has become combative and refused to come back to the office this afternoon for PVR. She states that the patient has not been able to void since having foley removed. [facility] has placed an order for foley reinsertion if the patient has not voided by 6pm.

## 2022-12-31 NOTE — Progress Notes (Signed)
   12/31/22 8:53 AM   Genevie Cheshire Roh [birth year] [id number]  CC: Urinary retention  HPI: 89 year old male hospitalized in July 2024 for left hip fracture and repair on 11/14/2022.  He developed postoperative urinary retention and a Foley catheter was placed, and he was started on Flomax.  Urinalysis was benign.  No prior imaging to review, no prior PSA values.  He denies any urinary symptoms prior to this hospitalization.  Catheter remains in place, he has not had any voiding trial since discharge.   Surgical History: Past Surgical History:  Procedure Laterality Date   INTRAMEDULLARY (IM) NAIL INTERTROCHANTERIC Left 11/14/2022   Procedure: INTRAMEDULLARY (IM) NAIL INTERTROCHANTERIC;  Surgeon: Christena Flake, MD;  Location: [facility] ORS;  Service: Orthopedics;  Laterality: Left;     Social History:  reports that he has never smoked. He has never used smokeless tobacco. He reports that he does not drink alcohol and does not use drugs.  Physical Exam: BP 138/81 (BP Location: Left Arm, Patient Position: Sitting, Cuff Size: Normal)   Pulse 65   Wt 188 lb 1.6 oz (85.3 kg)   BMI 28.60 kg/m    Constitutional: Alert, oriented, in wheelchair Cardiovascular: No clubbing, cyanosis, or edema. Respiratory: Normal respiratory effort, no increased work of breathing. GI: Abdomen is soft, nontender, nondistended, no abdominal masses Foley with yellow urine  Laboratory Data: Reviewed, normal renal function  Assessment & Plan:   89 year old male who underwent surgery for a left hip fracture on 11/14/2022 and had postoperative urinary retention.  Flomax was started, and he presents today for a voiding trial.  Keflex was given for prophylaxis, and Foley catheter was removed this morning.  He did not return this afternoon as scheduled for PVR.  We called his facility and they stated he had become combative and refused to return for bladder scan.  He had not yet voided.  We discussed with the facility replacing  the catheter at 6 PM if he has not yet voided.  If catheter has to be replaced, would recommend repeating void trial in 1 to 2 weeks.    Legrand Rams, MD 12/31/2022  [facility] [address] [place], [place] [address] [phone]
